# Patient Record
Sex: Female | Born: 1935
Health system: Southern US, Community
[De-identification: ages and names within clinical notes are randomized; demographics above are authoritative.]

## PROBLEM LIST (undated history)

## (undated) DIAGNOSIS — I10 Essential (primary) hypertension: Secondary | ICD-10-CM

## (undated) DIAGNOSIS — K219 Gastro-esophageal reflux disease without esophagitis: Secondary | ICD-10-CM

## (undated) DIAGNOSIS — K222 Esophageal obstruction: Secondary | ICD-10-CM

## (undated) DIAGNOSIS — G479 Sleep disorder, unspecified: Secondary | ICD-10-CM

## (undated) DIAGNOSIS — K224 Dyskinesia of esophagus: Secondary | ICD-10-CM

## (undated) DIAGNOSIS — R079 Chest pain, unspecified: Secondary | ICD-10-CM

## (undated) DIAGNOSIS — R002 Palpitations: Secondary | ICD-10-CM

## (undated) DIAGNOSIS — N289 Disorder of kidney and ureter, unspecified: Secondary | ICD-10-CM

## (undated) DIAGNOSIS — M8580 Other specified disorders of bone density and structure, unspecified site: Secondary | ICD-10-CM

## (undated) DIAGNOSIS — K589 Irritable bowel syndrome without diarrhea: Secondary | ICD-10-CM

## (undated) DIAGNOSIS — I2721 Secondary pulmonary arterial hypertension: Secondary | ICD-10-CM

## (undated) DIAGNOSIS — K449 Diaphragmatic hernia without obstruction or gangrene: Secondary | ICD-10-CM

## (undated) DIAGNOSIS — T7840XA Allergy, unspecified, initial encounter: Secondary | ICD-10-CM

## (undated) DIAGNOSIS — R55 Syncope and collapse: Secondary | ICD-10-CM

## (undated) DIAGNOSIS — M1990 Unspecified osteoarthritis, unspecified site: Secondary | ICD-10-CM

## (undated) DIAGNOSIS — N200 Calculus of kidney: Secondary | ICD-10-CM

## (undated) DIAGNOSIS — K5790 Diverticulosis of intestine, part unspecified, without perforation or abscess without bleeding: Secondary | ICD-10-CM

## (undated) HISTORY — DX: Other specified disorders of bone density and structure, unspecified site: M85.80

## (undated) HISTORY — DX: Syncope and collapse: R55

## (undated) HISTORY — DX: Gastro-esophageal reflux disease without esophagitis: K21.9

## (undated) HISTORY — DX: Palpitations: R00.2

## (undated) HISTORY — PX: CHOLECYSTECTOMY: SHX55

## (undated) HISTORY — DX: Calculus of kidney: N20.0

## (undated) HISTORY — PX: BREAST EXCISIONAL BIOPSY: SUR124

## (undated) HISTORY — DX: Chest pain, unspecified: R07.9

## (undated) HISTORY — DX: Allergy, unspecified, initial encounter: T78.40XA

## (undated) HISTORY — PX: ABDOMINAL HYSTERECTOMY: SHX81

## (undated) HISTORY — DX: Esophageal obstruction: K22.2

## (undated) HISTORY — DX: Diverticulosis of intestine, part unspecified, without perforation or abscess without bleeding: K57.90

## (undated) HISTORY — DX: Essential (primary) hypertension: I10

## (undated) HISTORY — DX: Dyskinesia of esophagus: K22.4

## (undated) HISTORY — DX: Diaphragmatic hernia without obstruction or gangrene: K44.9

## (undated) HISTORY — DX: Sleep disorder, unspecified: G47.9

## (undated) HISTORY — DX: Secondary pulmonary arterial hypertension: I27.21

## (undated) HISTORY — DX: Irritable bowel syndrome, unspecified: K58.9

## (undated) HISTORY — DX: Unspecified osteoarthritis, unspecified site: M19.90

---

## 1968-11-12 HISTORY — PX: KIDNEY SURGERY: SHX687

## 1971-11-13 HISTORY — PX: TOTAL ABDOMINAL HYSTERECTOMY W/ BILATERAL SALPINGOOPHORECTOMY: SHX83

## 1971-11-13 HISTORY — PX: APPENDECTOMY: SHX54

## 1991-02-11 HISTORY — PX: OTHER SURGICAL HISTORY: SHX169

## 1999-10-02 ENCOUNTER — Other Ambulatory Visit: Admission: RE | Admit: 1999-10-02 | Discharge: 1999-10-02 | Payer: Self-pay | Admitting: Obstetrics and Gynecology

## 2000-12-31 ENCOUNTER — Other Ambulatory Visit: Admission: RE | Admit: 2000-12-31 | Discharge: 2000-12-31 | Payer: Self-pay | Admitting: Obstetrics and Gynecology

## 2003-04-09 ENCOUNTER — Encounter: Payer: Self-pay | Admitting: Internal Medicine

## 2003-06-18 ENCOUNTER — Observation Stay (HOSPITAL_COMMUNITY): Admission: RE | Admit: 2003-06-18 | Discharge: 2003-06-19 | Payer: Self-pay | Admitting: General Surgery

## 2003-06-18 ENCOUNTER — Encounter (INDEPENDENT_AMBULATORY_CARE_PROVIDER_SITE_OTHER): Payer: Self-pay | Admitting: Specialist

## 2003-06-18 ENCOUNTER — Encounter: Payer: Self-pay | Admitting: General Surgery

## 2004-05-22 ENCOUNTER — Inpatient Hospital Stay (HOSPITAL_BASED_OUTPATIENT_CLINIC_OR_DEPARTMENT_OTHER): Admission: RE | Admit: 2004-05-22 | Discharge: 2004-05-22 | Payer: Self-pay | Admitting: Cardiology

## 2004-06-06 ENCOUNTER — Encounter: Payer: Self-pay | Admitting: Internal Medicine

## 2004-09-25 ENCOUNTER — Ambulatory Visit: Payer: Self-pay | Admitting: Internal Medicine

## 2004-12-28 ENCOUNTER — Ambulatory Visit: Payer: Self-pay | Admitting: Internal Medicine

## 2005-02-01 ENCOUNTER — Ambulatory Visit: Payer: Self-pay | Admitting: Internal Medicine

## 2005-03-12 ENCOUNTER — Ambulatory Visit: Payer: Self-pay | Admitting: Internal Medicine

## 2005-03-27 ENCOUNTER — Ambulatory Visit: Payer: Self-pay | Admitting: Internal Medicine

## 2005-05-18 ENCOUNTER — Ambulatory Visit: Payer: Self-pay | Admitting: Internal Medicine

## 2005-07-13 ENCOUNTER — Ambulatory Visit: Payer: Self-pay | Admitting: Internal Medicine

## 2005-08-29 ENCOUNTER — Ambulatory Visit: Payer: Self-pay | Admitting: Internal Medicine

## 2005-10-03 ENCOUNTER — Ambulatory Visit: Payer: Self-pay | Admitting: Internal Medicine

## 2005-12-17 ENCOUNTER — Ambulatory Visit: Payer: Self-pay | Admitting: Internal Medicine

## 2006-03-12 ENCOUNTER — Emergency Department (HOSPITAL_COMMUNITY): Admission: EM | Admit: 2006-03-12 | Discharge: 2006-03-12 | Payer: Self-pay | Admitting: Emergency Medicine

## 2006-03-14 ENCOUNTER — Ambulatory Visit: Payer: Self-pay | Admitting: Internal Medicine

## 2006-03-15 ENCOUNTER — Ambulatory Visit: Payer: Self-pay | Admitting: Internal Medicine

## 2006-05-08 ENCOUNTER — Ambulatory Visit: Payer: Self-pay | Admitting: Internal Medicine

## 2006-06-17 ENCOUNTER — Ambulatory Visit: Payer: Self-pay | Admitting: Family Medicine

## 2006-07-13 HISTORY — PX: ESOPHAGOGASTRODUODENOSCOPY: SHX1529

## 2006-07-30 ENCOUNTER — Ambulatory Visit: Payer: Self-pay | Admitting: Internal Medicine

## 2006-08-06 ENCOUNTER — Ambulatory Visit: Payer: Self-pay | Admitting: Internal Medicine

## 2006-08-06 ENCOUNTER — Encounter: Payer: Self-pay | Admitting: Internal Medicine

## 2006-08-27 ENCOUNTER — Ambulatory Visit: Payer: Self-pay | Admitting: Internal Medicine

## 2006-09-05 ENCOUNTER — Ambulatory Visit: Payer: Self-pay | Admitting: Internal Medicine

## 2006-09-10 ENCOUNTER — Ambulatory Visit (HOSPITAL_COMMUNITY): Admission: RE | Admit: 2006-09-10 | Discharge: 2006-09-10 | Payer: Self-pay | Admitting: Internal Medicine

## 2006-10-31 ENCOUNTER — Ambulatory Visit: Payer: Self-pay | Admitting: Internal Medicine

## 2007-01-14 ENCOUNTER — Ambulatory Visit: Payer: Self-pay | Admitting: Internal Medicine

## 2007-04-15 ENCOUNTER — Telehealth (INDEPENDENT_AMBULATORY_CARE_PROVIDER_SITE_OTHER): Payer: Self-pay | Admitting: *Deleted

## 2007-04-15 ENCOUNTER — Ambulatory Visit: Payer: Self-pay | Admitting: Internal Medicine

## 2007-04-15 DIAGNOSIS — F39 Unspecified mood [affective] disorder: Secondary | ICD-10-CM | POA: Insufficient documentation

## 2007-04-15 DIAGNOSIS — M899 Disorder of bone, unspecified: Secondary | ICD-10-CM | POA: Insufficient documentation

## 2007-04-15 DIAGNOSIS — M949 Disorder of cartilage, unspecified: Secondary | ICD-10-CM

## 2007-04-15 DIAGNOSIS — K219 Gastro-esophageal reflux disease without esophagitis: Secondary | ICD-10-CM | POA: Insufficient documentation

## 2007-04-15 DIAGNOSIS — I10 Essential (primary) hypertension: Secondary | ICD-10-CM | POA: Insufficient documentation

## 2007-04-15 DIAGNOSIS — K5730 Diverticulosis of large intestine without perforation or abscess without bleeding: Secondary | ICD-10-CM | POA: Insufficient documentation

## 2007-04-15 DIAGNOSIS — J309 Allergic rhinitis, unspecified: Secondary | ICD-10-CM | POA: Insufficient documentation

## 2007-07-24 ENCOUNTER — Ambulatory Visit: Payer: Self-pay | Admitting: Internal Medicine

## 2007-08-29 ENCOUNTER — Ambulatory Visit: Payer: Self-pay | Admitting: Internal Medicine

## 2007-11-24 ENCOUNTER — Ambulatory Visit: Payer: Self-pay | Admitting: Internal Medicine

## 2007-11-25 ENCOUNTER — Encounter: Payer: Self-pay | Admitting: Internal Medicine

## 2007-11-27 ENCOUNTER — Ambulatory Visit: Payer: Self-pay | Admitting: Internal Medicine

## 2007-12-31 ENCOUNTER — Telehealth: Payer: Self-pay | Admitting: Internal Medicine

## 2008-04-23 ENCOUNTER — Ambulatory Visit: Payer: Self-pay | Admitting: Family Medicine

## 2008-05-25 ENCOUNTER — Telehealth: Payer: Self-pay | Admitting: Internal Medicine

## 2008-06-03 ENCOUNTER — Telehealth: Payer: Self-pay | Admitting: Internal Medicine

## 2008-06-08 ENCOUNTER — Ambulatory Visit: Payer: Self-pay | Admitting: Internal Medicine

## 2008-06-08 DIAGNOSIS — K589 Irritable bowel syndrome without diarrhea: Secondary | ICD-10-CM

## 2008-08-16 ENCOUNTER — Ambulatory Visit: Payer: Self-pay | Admitting: Internal Medicine

## 2008-09-23 ENCOUNTER — Telehealth: Payer: Self-pay | Admitting: Internal Medicine

## 2008-11-06 ENCOUNTER — Telehealth: Payer: Self-pay | Admitting: Family Medicine

## 2008-12-09 ENCOUNTER — Ambulatory Visit: Payer: Self-pay | Admitting: Family Medicine

## 2008-12-13 ENCOUNTER — Telehealth: Payer: Self-pay | Admitting: Internal Medicine

## 2008-12-17 ENCOUNTER — Telehealth: Payer: Self-pay | Admitting: Internal Medicine

## 2009-01-13 ENCOUNTER — Ambulatory Visit: Payer: Self-pay | Admitting: Family Medicine

## 2009-01-13 LAB — CONVERTED CEMR LAB
Eosinophils Absolute: 0.1 10*3/uL (ref 0.0–0.7)
Eosinophils Relative: 0.8 % (ref 0.0–5.0)
HCT: 37.1 % (ref 36.0–46.0)
Hemoglobin: 12.9 g/dL (ref 12.0–15.0)
MCV: 83.4 fL (ref 78.0–100.0)
Monocytes Absolute: 0.6 10*3/uL (ref 0.1–1.0)
Monocytes Relative: 7.4 % (ref 3.0–12.0)
Neutro Abs: 6.7 10*3/uL (ref 1.4–7.7)
Platelets: 192 10*3/uL (ref 150–400)
WBC: 8.1 10*3/uL (ref 4.5–10.5)

## 2009-01-15 ENCOUNTER — Encounter: Payer: Self-pay | Admitting: Family Medicine

## 2009-04-07 ENCOUNTER — Ambulatory Visit: Payer: Self-pay | Admitting: Family Medicine

## 2009-08-03 ENCOUNTER — Ambulatory Visit: Payer: Self-pay | Admitting: Internal Medicine

## 2009-08-05 LAB — CONVERTED CEMR LAB
AST: 19 units/L (ref 0–37)
Albumin: 3.9 g/dL (ref 3.5–5.2)
Alkaline Phosphatase: 71 units/L (ref 39–117)
BUN: 20 mg/dL (ref 6–23)
Basophils Absolute: 0 10*3/uL (ref 0.0–0.1)
Bilirubin, Direct: 0.1 mg/dL (ref 0.0–0.3)
Calcium: 9.5 mg/dL (ref 8.4–10.5)
Creatinine, Ser: 0.8 mg/dL (ref 0.4–1.2)
Eosinophils Absolute: 0.3 10*3/uL (ref 0.0–0.7)
Glucose, Bld: 86 mg/dL (ref 70–99)
Lymphocytes Relative: 18.3 % (ref 12.0–46.0)
MCHC: 33.4 g/dL (ref 30.0–36.0)
Monocytes Relative: 6.6 % (ref 3.0–12.0)
Neutro Abs: 4 10*3/uL (ref 1.4–7.7)
Neutrophils Relative %: 70.2 % (ref 43.0–77.0)
Phosphorus: 3.6 mg/dL (ref 2.3–4.6)
Platelets: 196 10*3/uL (ref 150.0–400.0)
Potassium: 3.9 meq/L (ref 3.5–5.1)
RDW: 12 % (ref 11.5–14.6)
TSH: 0.84 microintl units/mL (ref 0.35–5.50)
Total Bilirubin: 0.9 mg/dL (ref 0.3–1.2)

## 2009-08-12 ENCOUNTER — Telehealth: Payer: Self-pay | Admitting: Internal Medicine

## 2009-09-21 ENCOUNTER — Telehealth: Payer: Self-pay | Admitting: Internal Medicine

## 2009-10-21 ENCOUNTER — Ambulatory Visit: Payer: Self-pay | Admitting: Internal Medicine

## 2010-04-05 ENCOUNTER — Telehealth: Payer: Self-pay | Admitting: Internal Medicine

## 2010-07-11 ENCOUNTER — Ambulatory Visit: Payer: Self-pay | Admitting: Internal Medicine

## 2010-07-12 ENCOUNTER — Telehealth: Payer: Self-pay | Admitting: Internal Medicine

## 2010-08-08 ENCOUNTER — Ambulatory Visit: Payer: Self-pay | Admitting: Internal Medicine

## 2010-08-08 ENCOUNTER — Encounter: Payer: Self-pay | Admitting: Internal Medicine

## 2010-08-08 DIAGNOSIS — R002 Palpitations: Secondary | ICD-10-CM | POA: Insufficient documentation

## 2010-08-09 LAB — CONVERTED CEMR LAB
ALT: 17 units/L (ref 0–35)
AST: 20 units/L (ref 0–37)
BUN: 23 mg/dL (ref 6–23)
CO2: 32 meq/L (ref 19–32)
Calcium: 9.4 mg/dL (ref 8.4–10.5)
Chloride: 104 meq/L (ref 96–112)
Eosinophils Relative: 1.5 % (ref 0.0–5.0)
GFR calc non Af Amer: 76.86 mL/min (ref >60)
HCT: 38.4 % (ref 36.0–46.0)
Hemoglobin: 13.4 g/dL (ref 12.0–15.0)
Lymphs Abs: 0.9 10*3/uL (ref 0.7–4.0)
MCV: 84 fL (ref 78.0–100.0)
Monocytes Absolute: 0.3 10*3/uL (ref 0.1–1.0)
Monocytes Relative: 7.1 % (ref 3.0–12.0)
Neutro Abs: 3.4 10*3/uL (ref 1.4–7.7)
RDW: 13.1 % (ref 11.5–14.6)
TSH: 1.02 microintl units/mL (ref 0.35–5.50)

## 2010-08-15 ENCOUNTER — Encounter: Admission: RE | Admit: 2010-08-15 | Discharge: 2010-08-15 | Payer: Self-pay | Admitting: Internal Medicine

## 2010-08-15 LAB — HM MAMMOGRAPHY: HM Mammogram: NORMAL

## 2010-08-16 ENCOUNTER — Encounter: Payer: Self-pay | Admitting: Internal Medicine

## 2010-11-12 HISTORY — PX: SQUAMOUS CELL CARCINOMA EXCISION: SHX2433

## 2010-12-12 NOTE — Letter (Signed)
Summary: Results Follow up Letter  [place] [facility]  [address] [place], [place] [address]   Phone: [phone]  Fax: [phone]    08/16/2010 MRN: [id number]  Dominique Hall [address] [place], [place]  [address]  Dear Dominique Hall,  The following are the results of your recent test(s):  Test         Result    Pap Smear:        Normal _____  Not Normal _____ Comments: ______________________________________________________ Cholesterol: LDL(Bad cholesterol):         Your goal is less than:         HDL (Good cholesterol):       Your goal is more than: Comments:  ______________________________________________________ Mammogram:        Normal __X___  Not Normal _____ Comments:mammo is fine repeat recommended in 1-2 years  ___________________________________________________________________ Hemoccult:        Normal _____  Not normal _______ Comments:    _____________________________________________________________________ Other Tests:    We routinely do not discuss normal results over the telephone.  If you desire a copy of the results, or you have any questions about this information we can discuss them at your next office visit.   Sincerely,

## 2010-12-12 NOTE — Progress Notes (Signed)
Summary: not feeling any better  Phone Note Call from Patient Call back at [phone]   Caller: Patient Call For: Dominique Salt MD Summary of Call: Patient is requesting an antibiotic to be called in to [facility]. She was seen yesterday w/ sore throat and she says that she is not feeling any better today. She would like to go ahead and start w/ antibiotic becuase she doesn't want it to get any worse. Please advise.  Initial call taken by: Melody Comas,  July 12, 2010 9:38 AM  Follow-up for Phone Call        let her know I sent Rx Follow-up by: Dominique Salt MD,  July 12, 2010 10:25 AM  Additional Follow-up for Phone Call Additional follow up Details #1::        Spoke with patient and advised results. Rx sent to pharmacy. Additional Follow-up by: Mervin Hack CMA (AAMA),  July 12, 2010 10:30 AM    New/Updated Medications: AZITHROMYCIN 250 MG TABS (AZITHROMYCIN) 2 tabs today, then 1 tab daily for 4 days for sinus infection Prescriptions: AZITHROMYCIN 250 MG TABS (AZITHROMYCIN) 2 tabs today, then 1 tab daily for 4 days for sinus infection  #6 x 0   Entered and Authorized by:   Dominique Salt MD   Signed by:   Dominique Salt MD on 07/12/2010   Method used:   Electronically to        [facility] [address] (retail)       [address]       [place], [place]  [address]       Ph: [phone]       Fax: [phone]   RxID:   [id number]

## 2010-12-12 NOTE — Progress Notes (Signed)
Summary: refill request for hctz  Phone Note Refill Request Message from:  Fax from Pharmacy  Refills Requested: Medication #1:  HYDROCHLOROTHIAZIDE 12.5 MG CAPS 1 daily Faxed form from [facility] is on your desk.  Initial call taken by: Lowella Petties CMA,  Apr 05, 2010 10:39 AM  Follow-up for Phone Call        okay x 1 year Follow-up by: Cindee Salt MD,  Apr 05, 2010 1:48 PM  Additional Follow-up for Phone Call Additional follow up Details #1::        Rx faxed to pharmacy Additional Follow-up by: DeShannon Smith CMA Duncan Dull),  Apr 05, 2010 4:22 PM    Prescriptions: HYDROCHLOROTHIAZIDE 12.5 MG CAPS (HYDROCHLOROTHIAZIDE) 1 daily  #90 x 3   Entered by:   Mervin Hack CMA (AAMA)   Authorized by:   Cindee Salt MD   Signed by:   Mervin Hack CMA (AAMA) on 04/05/2010   Method used:   Handwritten   RxID:   [id number]

## 2010-12-12 NOTE — Assessment & Plan Note (Signed)
Summary: SORE THROAT AND LARYNGITIS/JRR   Vital Signs:  Patient profile:   76 year old female Weight:      155 pounds BMI:     25.49 Temp:     98.3 degrees F oral Pulse rate:   80 / minute Pulse rhythm:   regular Resp:     16 per minute BP sitting:   120 / 70  (left arm) Cuff size:   regular  Vitals Entered By: Mervin Hack CMA Duncan Dull) (July 11, 2010 12:49 PM) CC: sore throat, sinus draining   History of Present Illness: Started with infection symptoms about 4-5 days ago Got chilled in various places with air conditioning the day before started with sore throat and laryngitis voice is some better now  Not muchy cough except one night 3 days ago--had croupy no fever no SOB Has had post nasal drip no otalgia  No meds for this  Allergies: 1)  ! * Shellfish 2)  Penicillin G Potassium (Penicillin G Potassium) 3)  * Sulfa (Sulfonamides) Group 4)  Aciphex (Rabeprazole Sodium)  Past History:  Past medical, surgical, family and social histories (including risk factors) reviewed for relevance to current acute and chronic problems.  Past Medical History: Reviewed history from 11/24/2007 and no changes required. Allergic rhinitis Anxiety/sleep disturbance Diverticulosis, colon GERD/hiatal hernia Hypertension Osteopenia IBS Esophageal Dysmotility (suspected by hx) Hiatal Hernia Non-obstructing Esophageal Ring  Past Surgical History: Reviewed history from 04/15/2007 and no changes required. Hysterectomy/BSO/appendectomy--1973 Tonsillectomy 4/92  Lumpectomy R breast (benign) 1970's Kidney operation 1997  Syncope 9/07   EGD/dilation--Gessner 8/04 Lap chole  Family History: Reviewed history from 04/15/2007 and no changes required. Dad died @89  MI, pneumonia Mom died @86  CVA No DM or cancer  Social History: Reviewed history from 06/08/2008 and no changes required. 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 Reino Kent AM  Review of Systems       had sense that she might have had fluid in right ear one day no nausea or vomiting able to eat okay  Physical Exam  General:  alert.  NAD Head:  No frontal or maxillary tenderness Ears:  R ear normal and L ear normal.   Nose:  mild inflammation Mouth:  no erythema and no exudates.   Neck:  supple, no masses, and no cervical lymphadenopathy.   Lungs:  normal respiratory effort, no intercostal retractions, no accessory muscle use, normal breath sounds, no crackles, and no wheezes.     Impression & Recommendations:  Problem # 1:  URI (ICD-465.9) Assessment New mostly laryngitis discussed apparent viral etiology discussed supportive care will have her call and will consdier antibiotics if signs of secondary bacterial infection  Complete Medication List: 1)  Hydrochlorothiazide 12.5 Mg Caps (Hydrochlorothiazide) .Marland Kitchen.. 1 daily 2)  Nexium 40 Mg Cpdr (Esomeprazole magnesium) .Marland Kitchen.. 1 daily each am 3)  Clorazepate Dipotassium 7.5 Mg Tabs (Clorazepate dipotassium) .Marland Kitchen.. 1 tab at bedtime as needed to help sleep  Patient Instructions: 1)  Please keep the appt for physical in September but cancel the blood test appt---we will draw the labs at the physical appt Prescriptions: CLORAZEPATE DIPOTASSIUM 7.5 MG TABS (CLORAZEPATE DIPOTASSIUM) 1 tab at bedtime as needed to help sleep  #30 x 0   Entered and Authorized by:   Cindee Salt MD   Signed by:   Cindee Salt MD on 07/11/2010   Method used:   Print then  Give to Patient   RxID:   [id number]   Current Allergies (reviewed today): ! * SHELLFISH PENICILLIN G POTASSIUM (PENICILLIN G POTASSIUM) * SULFA (SULFONAMIDES) GROUP ACIPHEX (RABEPRAZOLE SODIUM)

## 2010-12-12 NOTE — Assessment & Plan Note (Signed)
Summary: CPX/CLE   Vital Signs:  Patient profile:   76 year old female Weight:      152 pounds Temp:     98.1 degrees F oral Pulse rate:   72 / minute Pulse rhythm:   regular BP sitting:   120 / 70  (left arm) Cuff size:   regular  Vitals Entered By: Mervin Hack CMA Duncan Dull) (August 08, 2010 9:21 AM) CC: adult physical   History of Present Illness: Doing fine No new concerns due for mammo  Due for mammo  Tries to be careful with diet--inconsistent with this Careful with portions Starting to walk again  Doesn't remember last DEXA Not taking vitamin D--discussed this     Allergies: 1)  ! * Shellfish 2)  Penicillin G Potassium (Penicillin G Potassium) 3)  * Sulfa (Sulfonamides) Group 4)  Aciphex (Rabeprazole Sodium)  Past History:  Past medical, surgical, family and social histories (including risk factors) reviewed for relevance to current acute and chronic problems.  Past Medical History: Reviewed history from 11/24/2007 and no changes required. Allergic rhinitis Anxiety/sleep disturbance Diverticulosis, colon GERD/hiatal hernia Hypertension Osteopenia IBS Esophageal Dysmotility (suspected by hx) Hiatal Hernia Non-obstructing Esophageal Ring  Past Surgical History: Reviewed history from 04/15/2007 and no changes required. Hysterectomy/BSO/appendectomy--1973 Tonsillectomy 4/92  Lumpectomy R breast (benign) 1970's Kidney operation 1997  Syncope 9/07   EGD/dilation--Gessner 8/04 Lap chole  Family History: Reviewed history from 04/15/2007 and no changes required. Dad died @89  MI, pneumonia Mom died @86  CVA No DM or cancer  Social History: Reviewed history from 06/08/2008 and no changes required. 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 Reino Kent AM  Review of Systems General:  weight down a few pounds sleeps fairly  well--- occ bad night. No daytime sonmolence wears seat belt. Eyes:  Denies double vision and vision loss-1 eye; eye exam yesterday---early cataracts. ENT:  Complains of decreased hearing; denies ringing in ears; teeth okay--regular with dentist. CV:  Complains of palpitations; denies chest pain or discomfort, difficulty breathing at night, difficulty breathing while lying down, fainting, near fainting, and shortness of breath with exertion; occ palps "when excited" Gets tight across upper chest then Has restarted walking and notes progressive increasing stamina. Resp:  Denies cough and shortness of breath. GI:  Complains of indigestion; denies abdominal pain, bloody stools, change in bowel habits, dark tarry stools, nausea, and vomiting; reflux fairly well controlled with meds. GU:  Denies abnormal vaginal bleeding, dysuria, and incontinence; no sex--no problem . MS:  Denies joint pain and joint swelling; foot pain from plantar fasciitis is better. Derm:  Complains of itching and lesion(s); denies rash; occ red sore spots on her fingers that go away within a few days Has itchy spot on left foot--no lesion or rash. Neuro:  Denies headaches, numbness, tingling, and weakness. Psych:  Denies anxiety and depression. Endo:  Complains of cold intolerance; denies heat intolerance; mild increase in cold intolerance. Heme:  Denies abnormal bruising and enlarge lymph nodes. Allergy:  Denies seasonal allergies and sneezing.  Physical Exam  General:  alert and normal appearance.   Eyes:  pupils equal, pupils round, pupils reactive to light, and no optic disk abnormalities.   Ears:  R ear normal and L ear normal.   Mouth:  no erythema, no exudates, and no lesions.   Neck:  supple, no masses, no thyromegaly, no carotid bruits, and no cervical lymphadenopathy.   Breasts:  no masses, no abnormal thickening, no tenderness, and no adenopathy.   Lungs:  normal respiratory effort, no intercostal retractions,  no accessory muscle use, and normal breath sounds.   Heart:  normal rate, regular rhythm, no murmur, and no gallop.   Abdomen:  soft, non-tender, and no masses.   Msk:  no joint tenderness and no joint swelling.   Pulses:  1+ in feet Extremities:  No edema Neurologic:  alert & oriented X3, strength normal in all extremities, and gait normal.   Skin:  no rashes and no suspicious lesions.   Psych:  normally interactive, good eye contact, not anxious appearing, and not depressed appearing.     Impression & Recommendations:  Problem # 1:  PREVENTIVE HEALTH CARE (ICD-V70.0) Assessment Comment Only better on fitness due for mammo  Problem # 2:  PALPITATIONS (ICD-785.1) Assessment: New doesn't sound pathologic improved exercise tolerance of late EKG normal  Problem # 3:  HYPERTENSION (ICD-401.9) Assessment: Unchanged  good control no changes check labs  Her updated medication list for this problem includes:    Hydrochlorothiazide 12.5 Mg Caps (Hydrochlorothiazide) .Marland Kitchen... 1 daily  BP today: 120/70 Prior BP: 120/70 (07/11/2010)  Labs Reviewed: K+: 3.9 (08/03/2009) Creat: : 0.8 (08/03/2009)     Orders: Venipuncture ([address]) TLB-Renal Function Panel (80069-RENAL) TLB-CBC Platelet - w/Differential (85025-CBCD) TLB-Hepatic/Liver Function Pnl (80076-HEPATIC) TLB-TSH (Thyroid Stimulating Hormone) (84443-TSH)  Problem # 4:  GERD (ICD-530.81) Assessment: Unchanged okay on med  Her updated medication list for this problem includes:    Nexium 40 Mg Cpdr (Esomeprazole magnesium) .Marland Kitchen... 1 daily each am  Problem # 5:  OSTEOPENIA (ICD-733.90) Assessment: Comment Only  will recheck DEXA  Her updated medication list for this problem includes:    Vitamin D 1000 Unit Tabs (Cholecalciferol) .Marland Kitchen... 1 tab daily to strengthen bones  Orders: Radiology Referral (Radiology)  Problem # 6:  ANXIETY (ICD-300.00) Assessment: Unchanged uses the med occ  Her updated medication list for this  problem includes:    Clorazepate Dipotassium 7.5 Mg Tabs (Clorazepate dipotassium) .Marland Kitchen... 1 tab at bedtime as needed to help sleep  Complete Medication List: 1)  Hydrochlorothiazide 12.5 Mg Caps (Hydrochlorothiazide) .Marland Kitchen.. 1 daily 2)  Nexium 40 Mg Cpdr (Esomeprazole magnesium) .Marland Kitchen.. 1 daily each am 3)  Clorazepate Dipotassium 7.5 Mg Tabs (Clorazepate dipotassium) .Marland Kitchen.. 1 tab at bedtime as needed to help sleep 4)  Vitamin D 1000 Unit Tabs (Cholecalciferol) .Marland Kitchen.. 1 tab daily to strengthen bones  Other Orders: Influenza Vaccine MCR (60454)  Patient Instructions: 1)  Please schedule a follow-up appointment in 1 year.  2)  Schedule your mammogram.  3)  Please set up bone density test  Current Allergies (reviewed today): ! * SHELLFISH PENICILLIN G POTASSIUM (PENICILLIN G POTASSIUM) * SULFA (SULFONAMIDES) GROUP ACIPHEX (RABEPRAZOLE SODIUM)   Influenza Vaccine    Vaccine Type: Fluvax MCR    Site: left deltoid    Mfr: GlaxoSmithKline    Dose: 0.5 ml    Route: IM    Given by: Mervin Hack CMA (AAMA)    Exp. Date: 05/12/2011    Lot #: [id number]    VIS given: 06/06/10 version given August 08, 2010.  Flu Vaccine Consent Questions    Do you have a history of severe allergic reactions to this vaccine? no    Any prior history of allergic reactions to egg and/or gelatin? no    Do you have a sensitivity to the preservative Thimersol? no    Do you have a past history of Guillan-Barre Syndrome?  no    Do you currently have an acute febrile illness? no    Have you ever had a severe reaction to latex? no    Vaccine information given and explained to patient? yes    Are you currently pregnant? no

## 2011-03-30 NOTE — Op Note (Signed)
NAME:  BEAUTY, PLESS NO.:  [id number]   MEDICAL RECORD NO.:  [id number]                   PATIENT TYPE:  AMB   LOCATION:  DAY                                  FACILITY:  [facility]   PHYSICIAN:  Adolph Pollack, M.D.            DATE OF BIRTH:  1935/09/12   DATE OF PROCEDURE:  06/18/2003  DATE OF DISCHARGE:                                 OPERATIVE REPORT   PREOPERATIVE DIAGNOSIS:  Symptomatic cholecystitis.   POSTOPERATIVE DIAGNOSIS:  Chronic cholecystitis, cholelithiasis.   OPERATION/PROCEDURE:  Laparoscopic cholecystectomy with intraoperative  cholangiogram.   SURGEON:  Adolph Pollack, M.D.   ASSISTANT:  Abigail Miyamoto, M.D.   ANESTHESIA:  General.   INDICATIONS:  Mrs. Russi is a 76 year old female.  She had known gallbladder  disease. She has had some postprandial right upper quadrant pain radiating  through to the back.  This is consistent with biliary colic.  She has a  known 30 mm gallstone.  Now presents for elective cholecystectomy.  The  procedure and the risks were discussed with her preoperatively.   DESCRIPTION OF PROCEDURE:  She is seen in the holding area, brought to the  operating room, placed supine on the operating table and general anesthetic  was administered.  Her abdomen was sterilely prepped and draped.  Dilute  Marcaine solution was infiltrated in the subcutaneous tissue in the  subumbilical area and a small subumbilical incision was made incising the  skin and subcutaneous tissue sharply until the midline fascia was  identified.  A small incision was made in the midline fascia and the  peritoneum under direct vision.  The peritoneal cavity was entered.  A  pursestring suture was of 0 Vicryl was placed along the fascial edges.  A  Hasson trocar was introduced into the peritoneal cavity and pneumoperitoneum  was created by insufflation of the CO2 gas.  Next, a laparoscope was  introduced and I inspected the area  directly under the trocar.  No bowel  injury or other injury was noted.  She was then placed in reverse  Trendelenburg position and partial right lateral decubitus.  Under direct  vision, an 11 mm trocar was through an epigastric incision and two 5 mm  trocars were placed in the right mid and lateral abdomen.  The fundus of the  gallbladder was grasped and the gallbladder was noted to be a pale red,  grossly consistent with chronic inflammatory-type changes.  The fundus was  retracted toward the right shoulder.  Then the infundibulum was grasped and  mobilized using select cautery and blunt dissection.  I then isolated the  cystic duct-gallbladder junction.  Using blunt dissection, I created a  window around the cystic duct.  I placed a cup clip above the cystic duct-  gallbladder junction, then made an incision in the cystic duct at its  junction with the gallbladder.  The bile  was milked back from this.  I  placed a cholangiocatheter through the anterior abdominal wall through the  abdominal wall into the cystic duct and a cholangiogram was performed.   Using real time fluoroscopy, dilute contrast material was injected into the  cystic duct.  It was a moderate to long cystic duct.  The common hepatic,  right and left hepatic and common bile duct all filled promptly.  The common  bile duct promptly drained the contrast into the duodenum.  No obvious  evidence of obstruction was noted.  Final report is pending the  radiologist's interpretation.   The cholangiocatheter was removed, the cystic duct was clipped three times  staying inside and divided.  I identified a cystic artery, clipped it and  divided it.  The gallbladder was then dissected free from the liver bed  using cautery.  A small puncture wound was made in the gallbladder and a  small amount of bile leaked out.  Once the gallbladder was removed from the  liver bed, it was placed in an Endopouch bag.   Next, I irrigated the  gallbladder fossa and inspected it.  Bleeding points  were controlled with the cautery.  I inspected it a number of other times  and noted no further bleeding.  I noted no bile leak.  I copiously irrigated  out the perihepatic area and evacuated the fluid until it was clear.  Next,  under laparoscopic vision, I removed the gallbladder in the Endopouch bag  through the subumbilical port and closed the subumbilicus fascia defect  under laparoscopic vision by tightening up and tightening down the  pursestring.  The remaining fluid was then evacuated.  The trocars were  removed and the pneumoperitoneum was released.   The skin incisions were closed with 4-0 Monocryl subcuticular stitches.  Steri-Strips and sterile dressings were applied.  She tolerated the  procedure well without any apparent complications and was taken to the  recovery room in satisfactory condition.                                                 Adolph Pollack, M.D.    Kari Baars  D:  06/18/2003  T:  06/18/2003  Job:  [id number]   cc:   Iva Boop, M.D. [facility]   Tillman Abide, M.D.  [facility]  [address]  [place] [address]

## 2011-03-30 NOTE — Cardiovascular Report (Signed)
NAME:  Dominique Hall, Dominique Hall NO.:  [id number]   MEDICAL RECORD NO.:  [id number]                   PATIENT TYPE:  OIB   LOCATION:  6501                                 FACILITY:  [facility]   PHYSICIAN:  Rollene Rotunda, M.D.                DATE OF BIRTH:  1934/12/17   DATE OF PROCEDURE:  05/22/2004  DATE OF DISCHARGE:                              CARDIAC CATHETERIZATION   PRIMARY CARE PHYSICIAN:  Dr. Tillman Abide.   CARDIOLOGIST:  Pricilla Riffle, M.D.   PROCEDURE:  Evaluate patient with chest pain suggestive of new onset  exertional angina.   PROCEDURE NOTE:  Left heart catheterization was performed via the right  femoral artery.  The artery was cannulated using an anterior wall puncture.  A 4 French arterial sheath was inserted via the modified Seldinger  technique.  Preformed Judkins and a pigtail catheter were utilized.  The  patient tolerated the procedure well and left the lab in stable condition.   RESULTS:   HEMODYNAMICS:  1. LV 201/15.  2. Aortic 201/89.   CORONARIES:  The left main was normal.  The LAD was normal with mild mid  muscle bridging.  There were three or four small diagonals.  The circumflex  was a small narrow caliber vessel in the AV groove.  There was a large first  obtuse marginal which was branching and normal.  The right coronary artery  was dominant though not a wide caliber vessel.  There was a small PDA which  was normal.  The ejection fraction was 65%.   CONCLUSION:  1. No obstructive coronary artery disease.  2. Normal left ventricular function.   PLAN:  No further cardiac workup is suggested.  The patient will continue to  follow with Dr. Dr. Alphonsus Sias for evaluation of nonanginal chest pain.                                               Rollene Rotunda, M.D.    JH/MEDQ  D:  05/22/2004  T:  05/22/2004  Job:  [id number]

## 2011-03-30 NOTE — Assessment & Plan Note (Signed)
[facility]                           GASTROENTEROLOGY OFFICE NOTE   NAME:JONESHayden, Mabin                    MRN:          [id number]  DATE:09/05/2006                            DOB:          17-Apr-1935    Ms. Kraft returns to follow up chest pain and dysphagia.   Her dysphagia is improved after I dilated her esophagus.  She had some  fundic gland polyps as well on August 06, 2006.  However, she is still  having spells several times a week for the whole day where she has what  feels like a spasm and a tightening in the chest and maybe food does not go  down well.  It usually starts after eating.  There was one episode of a few  minutes of burning in the suprasternal notch after she walked hard and fast,  leaving a football game.  She describes no dyspnea or sweats or other  problems like that.  She has had cardiac workups over the years.  These  pains actually are similar to symptoms that I see documented in the chart  for years.  She tells me she actually has had a catheterization in the past  as well and that has been negative.  See my last visit for other details.   She does take Tranxene with some benefit, taking a whole rather than a half  a pill at this time.  She still seems frustrated by these symptoms.  Past  medical history reviewed and unchanged from note of July 30, 2006.   PHYSICAL EXAMINATION:  Weight 154 pounds, height 5 feet 5 inches, pulse 60,  blood pressure 120/70.   ASSESSMENT:  Dysphagia and chest pain, probably some sort of motility  disturbance.  She does have a 2 cm hiatal hernia.  She had a bit of a wing  in the esophagus with a lumen diameter of about 17 mm but I do not think  that was causing all of her symptoms obviously.  Her dysphagia is improved.   PLAN:  1. Check barium swallow.  2. Continue Tranxene.  3. Depending on results of barium swallow, either get a manometry study of      the esophagus or try  tricyclics or perhaps calcium channel blockers,      assuming this is a motility disturbance.  Daily Tranxene may be another      thing to do also, which could be prophylactic, I think.  She is using      it intermittently right now.     Iva Boop, MD,FACG    CEG/MedQ  DD: 09/05/2006  DT: 09/06/2006  Job #: [id number]   cc:   Karie Schwalbe, MD

## 2011-03-30 NOTE — Assessment & Plan Note (Signed)
[facility]                           GASTROENTEROLOGY OFFICE NOTE   NAME:JONESFinley, Dinkel                    MRN:          [id number]  DATE:07/30/2006                            DOB:          10-21-1935    CHIEF COMPLAINT:  Chest pain, esophageal spasms.   ASSESSMENT:  Dysphagia and chest pain problems that sound most like an  esophageal dysmotility problem, though a ring or stricture could cause some  of this.   PLAN:  1. Schedule upper GI endoscopy with possible esophageal dilation.  2. Further medication treatment with nitrates versus calcium channel      blockers versus tricyclic agent pending review of this, clinical course      and possible barium studies and manometry.  Risks, benefits and      indications of all the above explained.   HISTORY:  This is a 76 year old white woman that I know from previous  evaluation, she has had a colonoscopy for screening and symptoms of  irritable bowel syndrome had been an issue.  She also had an EGD with a  patent ring and a small hiatal hernia in Mar 20, 2003, though she did not  have complaints of dysphagia at that time, she had upper abdominal  discomfort and reflux.  For the past several months or so, she has had  spells where she feels like things tighten up in the esophagus and food does  not go down quite well, and she has spasm and a squeezing sensation.  She  has been to the emergency department for a cardiac evaluation, and  apparently had a stress test afterward, and that has been negative.  She saw  Dr. Alphonsus Sias in June, and complained about this, and told him she did better  on Nexium, and was switched back to that from omeprazole and had also  previously been on Protonix.  There is no real impact dysphagia, but food  tends to move down slowly.  Cold liquids can do this.  She tends to drink  cold Dr. Reino Kent in the morning.  She does not describe a lot of heartburn at  this time.   REVIEW OF SYSTEMS:  Negative for constitutional problems, respiratory  problems, psychiatric or anxiety problems.  She has had some palpitations  occasionally, and at least one time associated with this, and that is when  she had the emergency department evaluation and cardiac workup.   PAST MEDICAL HISTORY:  1. As above.  2. Cholecystectomy.  3. Diverticulosis and hemorrhoids on colonoscopy.  4. Irritable bowel syndrome.  5. Osteopenia.  6. Syncope 1997.  7. Kidney surgery in 1970s.  8. Tonsillectomy.  9. Right benign breast lumpectomy.  10.Hysterectomy with right salpingo-oophorectomy.  11.Appendectomy.  12.History of anxiety, though she says not active.  13.Allergies.   MEDICATIONS:  1. Nexium 40 mg twice daily.  2. Premarin 0.625 mg daily.  3. Aspirin 81 mg daily.  4. Hydrochlorothiazide 12.5 mg daily.  5. Tranxene 7.5 a half tablet p.r.n. (this does help some of her      symptoms).  6. Ibuprofen as needed.  7. Librax  p.r.n., did not really help.   DRUG ALLERGIES:  SULFA, PENICILLIN, SHELLFISH ALLERGY are noted.   FAMILY AND SOCIAL HISTORY:  Reviewed and unchanged.   PHYSICAL EXAMINATION:  GENERAL:  Reveals a well-developed, well-nourished  white woman.  VITAL SIGNS:  Weight 155 pounds, pulse 60, blood pressure 114/66.  EYES:  Anicteric.  ENT:  Mouth, posterior pharynx free of lesions.  NECK:  Supple.  CHEST:  Clear.  HEART:  S1, S2, no murmurs or gallops.  ABDOMEN:  Soft without organomegaly or mass.  LYMPH NODES:  No neck or supraclavicular adenopathy detected.  NEUROLOGIC:  She is alert and oriented x3.   I appreciate the opportunity to care for this patient.  I have reviewed Dr.  Karle Starch recent office notes.                                   Iva Boop, MD,FACG   CEG/MedQ  DD:  07/31/2006  DT:  08/02/2006  Job #:  [id number]   cc:   Karie Schwalbe, MD

## 2011-04-06 ENCOUNTER — Encounter: Payer: Self-pay | Admitting: Nurse Practitioner

## 2011-04-13 ENCOUNTER — Encounter: Payer: Self-pay | Admitting: Nurse Practitioner

## 2011-04-13 HISTORY — PX: SQUAMOUS CELL CARCINOMA EXCISION: SHX2433

## 2011-04-20 ENCOUNTER — Ambulatory Visit: Payer: Self-pay | Admitting: Surgery

## 2011-04-20 DIAGNOSIS — I10 Essential (primary) hypertension: Secondary | ICD-10-CM

## 2011-04-27 ENCOUNTER — Ambulatory Visit: Payer: Self-pay | Admitting: Surgery

## 2011-05-01 ENCOUNTER — Encounter: Payer: Self-pay | Admitting: Internal Medicine

## 2011-05-01 LAB — PATHOLOGY REPORT

## 2011-05-13 ENCOUNTER — Encounter: Payer: Self-pay | Admitting: Nurse Practitioner

## 2011-06-20 ENCOUNTER — Ambulatory Visit (INDEPENDENT_AMBULATORY_CARE_PROVIDER_SITE_OTHER): Payer: Medicare Other | Admitting: Internal Medicine

## 2011-06-20 ENCOUNTER — Encounter: Payer: Self-pay | Admitting: Family Medicine

## 2011-06-20 ENCOUNTER — Ambulatory Visit (INDEPENDENT_AMBULATORY_CARE_PROVIDER_SITE_OTHER): Payer: 59 | Admitting: Family Medicine

## 2011-06-20 ENCOUNTER — Encounter: Payer: Self-pay | Admitting: Internal Medicine

## 2011-06-20 VITALS — BP 128/70 | HR 73 | Temp 98.0°F | Ht 65.0 in | Wt 157.0 lb

## 2011-06-20 DIAGNOSIS — R109 Unspecified abdominal pain: Secondary | ICD-10-CM

## 2011-06-20 DIAGNOSIS — R1032 Left lower quadrant pain: Secondary | ICD-10-CM

## 2011-06-20 MED ORDER — CLORAZEPATE DIPOTASSIUM 7.5 MG PO TABS: 7.5000 mg | ORAL_TABLET | Freq: Every evening | ORAL | Status: DC | PRN

## 2011-06-20 MED ORDER — METRONIDAZOLE 250 MG PO TABS: 250.0000 mg | ORAL_TABLET | Freq: Two times a day (BID) | ORAL | Status: AC

## 2011-06-20 MED ORDER — CIPROFLOXACIN HCL 250 MG PO TABS: 250.0000 mg | ORAL_TABLET | Freq: Two times a day (BID) | ORAL | Status: AC

## 2011-06-20 MED ORDER — HYDROCHLOROTHIAZIDE 12.5 MG PO CAPS: 12.5000 mg | ORAL_CAPSULE | Freq: Every day | ORAL | Status: DC

## 2011-06-20 NOTE — Assessment & Plan Note (Signed)
Intermittent No fever Eating okay and bowels fairly normal Has had similar bouts in past and did well with antibiotics May be mild diverticulitis Will treat with cipro and flagyl

## 2011-06-20 NOTE — Progress Notes (Signed)
  Subjective:    Patient ID: Dominique Hall, female    DOB: 08/18/1935, 76 y.o.   MRN: [id number]  HPI Having recurrence of abdominal cramping--intermittent Not related to eating Started mostly suprapubic about 1 week ago No problems voiding--no urgency or dysuria Normal stools for the most part---no blood  Relates to eating bowl of pinto beans  No fever No nausea or vomiting Appetite is fairly normal  Ibuprofen last night seemed to help No known abdominal wall strain  Current Outpatient Prescriptions on File Prior to Visit  Medication Sig Dispense Refill  . Cholecalciferol (VITAMIN D) 1000 UNITS capsule Take 1,000 Units by mouth daily.        . clorazepate (TRANXENE) 7.5 MG tablet Take 7.5 mg by mouth at bedtime.        Marland Kitchen esomeprazole (NEXIUM) 40 MG capsule Take 40 mg by mouth daily before breakfast.          Allergies  Allergen Reactions  . Penicillins     REACTION: unspecified  . Rabeprazole Sodium     REACTION: constipated  . Sulfonamide Derivatives     REACTION: unspecified    Past Medical History  Diagnosis Date  . Allergy   . Arthritis   . Sleep disturbance   . Diverticulosis   . GERD (gastroesophageal reflux disease)   . Hiatal hernia   . HTN (hypertension)   . Osteopenia   . IBS (irritable bowel syndrome)   . Esophageal dysmotility   . Esophageal ring     Past Surgical History  Procedure Date  . Squamous cell carcinoma excision 6/12    Right leg--Dr Michela Pitcher  . Lumpectomy [phone]    benign right breast  . Appendectomy 1973  . Total abdominal hysterectomy w/ bilateral salpingoophorectomy 1973  . Kidney surgery 1970  . Esophagogastroduodenoscopy 07-2006    with dilation  . Cholecystectomy   . Abdominal hysterectomy     Family History  Problem Relation Age of Onset  . Stroke Mother   . Heart attack Father     History   Social History  . Marital Status: Single    Spouse Name: N/A    Number of Children: 2  . Years of Education: N/A    Occupational History  . consignment shop    Social History Main Topics  . Smoking status: Former Games developer  . Smokeless tobacco: Never Used  . Alcohol Use: Yes  . Drug Use: No  . Sexually Active: Not on file   Other Topics Concern  . Not on file   Social History Narrative  . No narrative on file   Review of Systems No cough  No SOB     Objective:   Physical Exam  Constitutional: She appears well-developed and well-nourished. No distress.  Neck: Normal range of motion.  Cardiovascular: Normal rate, regular rhythm and normal heart sounds.  Exam reveals no gallop.   No murmur heard. Pulmonary/Chest: Effort normal and breath sounds normal. No respiratory distress. She has no wheezes. She has no rales.  Abdominal: Soft. Bowel sounds are normal. She exhibits no distension. There is tenderness.       Mild LLQ tenderness  Lymphadenopathy:    She has no cervical adenopathy.          Assessment & Plan:

## 2011-06-21 NOTE — Progress Notes (Signed)
  Subjective:    Patient ID: Dominique Hall, female    DOB: 05/28/1935, 76 y.o.   MRN: [id number]  HPI  Error - no office visit.  Review of Systems     Objective:   Physical Exam        Assessment & Plan:

## 2011-08-02 ENCOUNTER — Other Ambulatory Visit: Payer: Self-pay | Admitting: *Deleted

## 2011-08-02 MED ORDER — ESOMEPRAZOLE MAGNESIUM 40 MG PO CPDR: 40.0000 mg | DELAYED_RELEASE_CAPSULE | Freq: Every day | ORAL | Status: DC

## 2011-08-14 ENCOUNTER — Ambulatory Visit (INDEPENDENT_AMBULATORY_CARE_PROVIDER_SITE_OTHER): Payer: Medicare Other | Admitting: Internal Medicine

## 2011-08-14 ENCOUNTER — Encounter: Payer: Self-pay | Admitting: Internal Medicine

## 2011-08-14 VITALS — BP 129/65 | HR 83 | Temp 98.9°F | Ht 65.0 in | Wt 155.0 lb

## 2011-08-14 DIAGNOSIS — F411 Generalized anxiety disorder: Secondary | ICD-10-CM

## 2011-08-14 DIAGNOSIS — I10 Essential (primary) hypertension: Secondary | ICD-10-CM

## 2011-08-14 DIAGNOSIS — R1032 Left lower quadrant pain: Secondary | ICD-10-CM

## 2011-08-14 DIAGNOSIS — Z23 Encounter for immunization: Secondary | ICD-10-CM

## 2011-08-14 DIAGNOSIS — Z0000 Encounter for general adult medical examination without abnormal findings: Secondary | ICD-10-CM | POA: Insufficient documentation

## 2011-08-14 LAB — CBC WITH DIFFERENTIAL/PLATELET
Basophils Relative: 0.1 % (ref 0.0–3.0)
Eosinophils Absolute: 0.1 10*3/uL (ref 0.0–0.7)
HCT: 38 % (ref 36.0–46.0)
Hemoglobin: 12.8 g/dL (ref 12.0–15.0)
Lymphocytes Relative: 9.6 % — ABNORMAL LOW (ref 12.0–46.0)
MCHC: 33.6 g/dL (ref 30.0–36.0)
MCV: 84.6 fl (ref 78.0–100.0)
Monocytes Absolute: 0.6 10*3/uL (ref 0.1–1.0)
Neutro Abs: 7.3 10*3/uL (ref 1.4–7.7)
RBC: 4.5 Mil/uL (ref 3.87–5.11)

## 2011-08-14 LAB — BASIC METABOLIC PANEL
CO2: 30 mEq/L (ref 19–32)
Chloride: 106 mEq/L (ref 96–112)
Potassium: 3.9 mEq/L (ref 3.5–5.1)
Sodium: 143 mEq/L (ref 135–145)

## 2011-08-14 LAB — HEPATIC FUNCTION PANEL
ALT: 22 U/L (ref 0–35)
Bilirubin, Direct: 0.1 mg/dL (ref 0.0–0.3)
Total Protein: 7.1 g/dL (ref 6.0–8.3)

## 2011-08-14 LAB — LIPID PANEL: Total CHOL/HDL Ratio: 2

## 2011-08-14 MED ORDER — ONDANSETRON 4 MG PO TBDP: 4.0000 mg | ORAL_TABLET | Freq: Three times a day (TID) | ORAL | Status: DC | PRN

## 2011-08-14 NOTE — Assessment & Plan Note (Signed)
Doing well Only rarely uses med at night

## 2011-08-14 NOTE — Assessment & Plan Note (Signed)
Doing well Discussed fitness and proper eating Rx for zostavax mammo next year (every 2 years)

## 2011-08-14 NOTE — Assessment & Plan Note (Signed)
Recurrence last night Doesn't seem like infection Rx for ondansetron for prn use

## 2011-08-14 NOTE — Assessment & Plan Note (Signed)
BP Readings from Last 3 Encounters:  08/14/11 129/65  06/20/11 128/70  08/08/10 120/70   Good control Due for labs

## 2011-08-14 NOTE — Progress Notes (Signed)
Subjective:    Patient ID: Dominique Hall, female    DOB: Mar 29, 1935, 76 y.o.   MRN: [id number]  HPI Had another stomach attack last night Was suprapubic pain---relates to after eating hot dog Felt flu-like this AM but feeling some better now No fever Feels better now   Recently has become irregular with bowels for first time None  for 2 days, then 3rd day will have loose large stool but only one No sig pain or blood Discussed trying fiber (she asks about activia--okay to try this)  Had squamous cell carcinoma removed from right calf Needed reoperation for non healing  Heartburn quiet on nexium Wonders about going off Discussed trying to wean off  Current Outpatient Prescriptions on File Prior to Visit  Medication Sig Dispense Refill  . Cholecalciferol (VITAMIN D) 1000 UNITS capsule Take 1,000 Units by mouth daily.        . clorazepate (TRANXENE) 7.5 MG tablet Take 1 tablet (7.5 mg total) by mouth at bedtime as needed for anxiety.  30 tablet  1  . esomeprazole (NEXIUM) 40 MG capsule Take 1 capsule (40 mg total) by mouth daily before breakfast.  90 capsule  3  . hydrochlorothiazide (,MICROZIDE/HYDRODIURIL,) 12.5 MG capsule Take 1 capsule (12.5 mg total) by mouth daily.  90 capsule  3    Allergies  Allergen Reactions  . Penicillins     REACTION: unspecified  . Rabeprazole Sodium     REACTION: constipated  . Sulfonamide Derivatives     REACTION: unspecified    Past Medical History  Diagnosis Date  . Allergy   . Arthritis   . Sleep disturbance   . Diverticulosis   . GERD (gastroesophageal reflux disease)   . Hiatal hernia   . HTN (hypertension)   . Osteopenia   . IBS (irritable bowel syndrome)   . Esophageal dysmotility   . Esophageal ring     Past Surgical History  Procedure Date  . Squamous cell carcinoma excision 6/12    Right leg--Dr Michela Pitcher  . Lumpectomy [phone]    benign right breast  . Appendectomy 1973  . Total abdominal hysterectomy w/ bilateral  salpingoophorectomy 1973  . Kidney surgery 1970  . Esophagogastroduodenoscopy 07-2006    with dilation  . Cholecystectomy   . Abdominal hysterectomy   . Squamous cell carcinoma excision 2012    right calf    Family History  Problem Relation Age of Onset  . Stroke Mother   . Heart attack Father     History   Social History  . Marital Status: Single    Spouse Name: N/A    Number of Children: 2  . Years of Education: N/A   Occupational History  . consignment shop    Social History Main Topics  . Smoking status: Former Games developer  . Smokeless tobacco: Never Used  . Alcohol Use: Yes  . Drug Use: No  . Sexually Active: Not on file   Other Topics Concern  . Not on file   Social History Narrative  . No narrative on file   Review of Systems  Constitutional: Negative for fatigue and unexpected weight change.       Wears seat belt  HENT: Positive for hearing loss. Negative for congestion, rhinorrhea, dental problem and tinnitus.        Regular with dentist  Eyes: Negative for visual disturbance.       No diplopia or unilateral vision loss  Respiratory: Negative for cough, chest tightness and shortness of  breath.   Cardiovascular: Positive for palpitations. Negative for chest pain and leg swelling.       Occ brief heart fluttering  Gastrointestinal: Positive for abdominal pain and diarrhea. Negative for nausea, vomiting and blood in stool.  Genitourinary: Negative for dysuria, frequency and difficulty urinating.       No sex due to husband  Musculoskeletal: Negative for back pain, joint swelling and arthralgias.  Skin: Negative for rash.       SCC this year  Neurological: Negative for dizziness, syncope, weakness, light-headedness, numbness and headaches.  Hematological: Negative for adenopathy. Does not bruise/bleed easily.  Psychiatric/Behavioral: Negative for sleep disturbance and dysphoric mood. The patient is not nervous/anxious.        Only occ uses chlorazepate        Objective:   Physical Exam  Constitutional: She is oriented to person, place, and time. She appears well-developed and well-nourished. No distress.  HENT:  Head: Normocephalic and atraumatic.  Right Ear: External ear normal.  Left Ear: External ear normal.  Mouth/Throat: Oropharynx is clear and moist. No oropharyngeal exudate.       TMs normal  Eyes: Conjunctivae and EOM are normal. Pupils are equal, round, and reactive to light.       Fundi benign  Neck: Normal range of motion. Neck supple. No thyromegaly present.  Cardiovascular: Normal rate, regular rhythm, normal heart sounds and intact distal pulses.  Exam reveals no gallop.   No murmur heard. Pulmonary/Chest: Effort normal and breath sounds normal. No respiratory distress. She has no wheezes. She has no rales.  Abdominal: She exhibits no mass. There is no tenderness.  Genitourinary:       No mass or breast lesions  Musculoskeletal: Normal range of motion. She exhibits no edema and no tenderness.  Lymphadenopathy:    She has no cervical adenopathy.    She has no axillary adenopathy.  Neurological: She is alert and oriented to person, place, and time. She exhibits normal muscle tone.       Normal strength and gait  Skin: No rash noted.  Psychiatric: She has a normal mood and affect. Her behavior is normal. Judgment and thought content normal.          Assessment & Plan:

## 2011-08-19 ENCOUNTER — Other Ambulatory Visit: Payer: Self-pay | Admitting: Internal Medicine

## 2011-08-20 ENCOUNTER — Encounter: Payer: Self-pay | Admitting: Internal Medicine

## 2011-08-20 NOTE — Telephone Encounter (Signed)
Okay to refill #20 x 0 Would recommend GI eval if this problem is persisting Has she seen someone in the past?

## 2011-08-20 NOTE — Telephone Encounter (Signed)
rx sent to pharmacy by e-script Patient has seen Dr. Leone Payor in the past

## 2011-08-20 NOTE — Telephone Encounter (Signed)
Ok to refill? Last refill 08/14/11

## 2011-10-01 ENCOUNTER — Ambulatory Visit (INDEPENDENT_AMBULATORY_CARE_PROVIDER_SITE_OTHER): Payer: Medicare Other | Admitting: Internal Medicine

## 2011-10-01 ENCOUNTER — Encounter: Payer: Self-pay | Admitting: Internal Medicine

## 2011-10-01 VITALS — BP 125/64 | HR 80 | Temp 97.9°F | Ht 65.0 in | Wt 155.0 lb

## 2011-10-01 DIAGNOSIS — J069 Acute upper respiratory infection, unspecified: Secondary | ICD-10-CM

## 2011-10-01 MED ORDER — AZITHROMYCIN 250 MG PO TABS: ORAL_TABLET | ORAL | Status: AC

## 2011-10-01 NOTE — Progress Notes (Signed)
Subjective:    Patient ID: Dominique Hall, female    DOB: 03-20-1935, 76 y.o.   MRN: [id number]  HPI Tends to get sick this time of year Symptoms started 2 days ago or so Noted fever yesterday Bad cough since last night Robitussin and ibuprofen--some help  Cough is loose--swallowing mucus Feels it is more tight today SOme SOB Bad sore throat---this was the initial symptom No ear pain  Current Outpatient Prescriptions on File Prior to Visit  Medication Sig Dispense Refill  . Cholecalciferol (VITAMIN D) 1000 UNITS capsule Take 1,000 Units by mouth daily.        . clorazepate (TRANXENE) 7.5 MG tablet Take 1 tablet (7.5 mg total) by mouth at bedtime as needed for anxiety.  30 tablet  1  . esomeprazole (NEXIUM) 40 MG capsule Take 1 capsule (40 mg total) by mouth daily before breakfast.  90 capsule  3  . hydrochlorothiazide (,MICROZIDE/HYDRODIURIL,) 12.5 MG capsule Take 1 capsule (12.5 mg total) by mouth daily.  90 capsule  3  . ondansetron (ZOFRAN-ODT) 4 MG disintegrating tablet DISSOLVE ONE TABLET IN MOUTH EVERY EIGHT HOURS AS NEEDED FOR NAUSEA  20 tablet  0    Allergies  Allergen Reactions  . Penicillins     REACTION: unspecified  . Rabeprazole Sodium     REACTION: constipated  . Sulfonamide Derivatives     REACTION: unspecified    Past Medical History  Diagnosis Date  . Allergy   . Arthritis   . Sleep disturbance   . Diverticulosis   . GERD (gastroesophageal reflux disease)   . Hiatal hernia   . HTN (hypertension)   . Osteopenia   . IBS (irritable bowel syndrome)   . Esophageal dysmotility   . Esophageal ring     Past Surgical History  Procedure Date  . Squamous cell carcinoma excision 6/12    Right leg--Dr Michela Pitcher  . Lumpectomy [phone]    benign right breast  . Appendectomy 1973  . Total abdominal hysterectomy w/ bilateral salpingoophorectomy 1973  . Kidney surgery 1970  . Esophagogastroduodenoscopy 07-2006    with dilation  . Cholecystectomy   . Abdominal  hysterectomy   . Squamous cell carcinoma excision 2012    right calf    Family History  Problem Relation Age of Onset  . Stroke Mother   . Heart attack Father     History   Social History  . Marital Status: Single    Spouse Name: N/A    Number of Children: 2  . Years of Education: N/A   Occupational History  . consignment shop    Social History Main Topics  . Smoking status: Former Games developer  . Smokeless tobacco: Never Used  . Alcohol Use: Yes  . Drug Use: No  . Sexually Active: Not on file   Other Topics Concern  . Not on file   Social History Narrative  . No narrative on file   Review of Systems No N/V Some loose stools yesterday and today Appetite is off No rash     Objective:   Physical Exam  Constitutional: She appears well-developed and well-nourished. No distress.       Hoarse  HENT:  Right Ear: External ear normal.  Left Ear: External ear normal.  Mouth/Throat: Oropharynx is clear and moist. No oropharyngeal exudate.       No sig sinus tenderness Mild nasal congestion TMs normal  Neck: Normal range of motion. Neck supple.  Pulmonary/Chest: Effort normal and breath sounds normal.  No respiratory distress. She has no wheezes. She has no rales.  Lymphadenopathy:    She has no cervical adenopathy.          Assessment & Plan:

## 2011-10-01 NOTE — Assessment & Plan Note (Signed)
Seems to be viral, esp with the laryngitis Discussed symptomatic care If worsens later in the week, she will fill z-pak (I urged her not to take it unless she wasn't improving after 7-10 days of illness)

## 2011-10-01 NOTE — Patient Instructions (Signed)
Please only take the antibiotic if you are not getting better by next weekend, or if you worsen with high fever and more productive cough

## 2011-12-31 ENCOUNTER — Other Ambulatory Visit: Payer: Self-pay | Admitting: *Deleted

## 2011-12-31 MED ORDER — CLORAZEPATE DIPOTASSIUM 7.5 MG PO TABS: 7.5000 mg | ORAL_TABLET | Freq: Every evening | ORAL | Status: DC | PRN

## 2011-12-31 NOTE — Telephone Encounter (Signed)
rx faxed to pharmacy manually, [facility]  

## 2011-12-31 NOTE — Telephone Encounter (Signed)
Rx written #90 x 0

## 2011-12-31 NOTE — Telephone Encounter (Signed)
Fax on your desk from [facility].

## 2012-01-14 ENCOUNTER — Ambulatory Visit (INDEPENDENT_AMBULATORY_CARE_PROVIDER_SITE_OTHER): Payer: Medicare Other | Admitting: Internal Medicine

## 2012-01-14 ENCOUNTER — Encounter: Payer: Self-pay | Admitting: Internal Medicine

## 2012-01-14 DIAGNOSIS — R197 Diarrhea, unspecified: Secondary | ICD-10-CM | POA: Insufficient documentation

## 2012-01-14 NOTE — Progress Notes (Signed)
Subjective:    Patient ID: Dominique Hall, female    DOB: April 13, 1935, 77 y.o.   MRN: [id number]  HPI Started last Wednesday-- 2/27 Nausea at first but no vomiting Loose stools----pure water Appetite is gone Fever over the weekend---didn't take but she could tell No blood in stool Started with ~8-10 stools per day at first----now less in past couple of days Urgency in AM also  Drinking lots of fluids---gatorade Hasn't taken any diarrheal meds Has had lots of acid reflux---increased nexium to bid  Lower abdominal "griping" Lots of gas at first---led to stool   Current Outpatient Prescriptions on File Prior to Visit  Medication Sig Dispense Refill  . Cholecalciferol (VITAMIN D) 1000 UNITS capsule Take 1,000 Units by mouth daily.        . clorazepate (TRANXENE) 7.5 MG tablet Take 1 tablet (7.5 mg total) by mouth at bedtime as needed for anxiety.  90 tablet  0  . esomeprazole (NEXIUM) 40 MG capsule Take 1 capsule (40 mg total) by mouth daily before breakfast.  90 capsule  3  . hydrochlorothiazide (,MICROZIDE/HYDRODIURIL,) 12.5 MG capsule Take 1 capsule (12.5 mg total) by mouth daily.  90 capsule  3  . ondansetron (ZOFRAN-ODT) 4 MG disintegrating tablet DISSOLVE ONE TABLET IN MOUTH EVERY EIGHT HOURS AS NEEDED FOR NAUSEA  20 tablet  0    Allergies  Allergen Reactions  . Penicillins     REACTION: unspecified  . Rabeprazole Sodium     REACTION: constipated  . Sulfonamide Derivatives     REACTION: unspecified    Past Medical History  Diagnosis Date  . Allergy   . Arthritis   . Sleep disturbance   . Diverticulosis   . GERD (gastroesophageal reflux disease)   . Hiatal hernia   . HTN (hypertension)   . Osteopenia   . IBS (irritable bowel syndrome)   . Esophageal dysmotility   . Esophageal ring     Past Surgical History  Procedure Date  . Squamous cell carcinoma excision 6/12    Right leg--Dr Michela Pitcher  . Lumpectomy [phone]    benign right breast  . Appendectomy 1973  .  Total abdominal hysterectomy w/ bilateral salpingoophorectomy 1973  . Kidney surgery 1970  . Esophagogastroduodenoscopy 07-2006    with dilation  . Cholecystectomy   . Abdominal hysterectomy   . Squamous cell carcinoma excision 2012    right calf    Family History  Problem Relation Age of Onset  . Stroke Mother   . Heart attack Father     History   Social History  . Marital Status: Single    Spouse Name: N/A    Number of Children: 2  . Years of Education: N/A   Occupational History  . consignment shop    Social History Main Topics  . Smoking status: Former Games developer  . Smokeless tobacco: Never Used  . Alcohol Use: Yes  . Drug Use: No  . Sexually Active: Not on file   Other Topics Concern  . Not on file   Social History Narrative  . No narrative on file   Review of Systems No dysuria or urgency No recent antibiotic rx    Objective:   Physical Exam  Constitutional: She appears well-developed and well-nourished. No distress.  Neck: Normal range of motion.  Pulmonary/Chest: Effort normal and breath sounds normal. No respiratory distress. She has no wheezes. She has no rales.  Abdominal: Soft. Bowel sounds are normal. She exhibits no distension and no mass. There  is tenderness. There is no rebound and no guarding.       Mild LLQ tenderness   Lymphadenopathy:    She has no cervical adenopathy.          Assessment & Plan:

## 2012-01-14 NOTE — Assessment & Plan Note (Signed)
Probably viral gastroenteritis Discussed that picture is not typical for diverticulitis and I didn't recommend antibiotics Discussed adding food slowly Stop the gatorade--may increase the diarrhea

## 2012-01-31 ENCOUNTER — Other Ambulatory Visit: Payer: Self-pay | Admitting: *Deleted

## 2012-01-31 NOTE — Telephone Encounter (Signed)
Patient requesting to switch pharmacies from [facility] to [facility]. I spoke with patient and advised that we manually faxed rx to [facility] on 12/31/11 and she should have enough to last. Pt states she will call and check on rx.

## 2012-01-31 NOTE — Telephone Encounter (Signed)
Yes, she should be set for a while now Can send to new pharmacy when this supply runs out if she doesn't want mail order next time

## 2012-01-31 NOTE — Telephone Encounter (Signed)
ok 

## 2012-04-02 ENCOUNTER — Ambulatory Visit: Payer: Medicare Other | Admitting: Internal Medicine

## 2012-04-21 ENCOUNTER — Other Ambulatory Visit: Payer: Self-pay | Admitting: Internal Medicine

## 2012-04-23 ENCOUNTER — Other Ambulatory Visit: Payer: Self-pay | Admitting: *Deleted

## 2012-04-23 NOTE — Telephone Encounter (Signed)
Has switched to Dr Dan Humphreys apparently Please send on to her

## 2012-04-23 NOTE — Telephone Encounter (Signed)
Form on your desk  

## 2012-04-23 NOTE — Telephone Encounter (Signed)
Spoke with patient and she's still your patient, pt thought about changing to Dr.Walker because of location but decided to stay with Dr.Letvak.

## 2012-04-24 MED ORDER — CLORAZEPATE DIPOTASSIUM 7.5 MG PO TABS: 7.5000 mg | ORAL_TABLET | Freq: Every evening | ORAL | Status: DC | PRN

## 2012-04-24 NOTE — Telephone Encounter (Signed)
rx faxed to pharmacy manually, [facility]

## 2012-04-24 NOTE — Telephone Encounter (Signed)
Okay to refill #90 x 0 

## 2012-05-08 ENCOUNTER — Ambulatory Visit (INDEPENDENT_AMBULATORY_CARE_PROVIDER_SITE_OTHER): Payer: Medicare Other | Admitting: Family Medicine

## 2012-05-08 ENCOUNTER — Telehealth: Payer: Self-pay | Admitting: Internal Medicine

## 2012-05-08 ENCOUNTER — Encounter: Payer: Self-pay | Admitting: Family Medicine

## 2012-05-08 VITALS — BP 120/72 | HR 78 | Temp 97.8°F | Ht 65.0 in | Wt 153.8 lb

## 2012-05-08 DIAGNOSIS — K5732 Diverticulitis of large intestine without perforation or abscess without bleeding: Secondary | ICD-10-CM

## 2012-05-08 DIAGNOSIS — R197 Diarrhea, unspecified: Secondary | ICD-10-CM

## 2012-05-08 DIAGNOSIS — K5792 Diverticulitis of intestine, part unspecified, without perforation or abscess without bleeding: Secondary | ICD-10-CM

## 2012-05-08 MED ORDER — METRONIDAZOLE 500 MG PO TABS: 500.0000 mg | ORAL_TABLET | Freq: Three times a day (TID) | ORAL | Status: AC

## 2012-05-08 MED ORDER — CIPROFLOXACIN HCL 750 MG PO TABS: 750.0000 mg | ORAL_TABLET | Freq: Two times a day (BID) | ORAL | Status: AC

## 2012-05-08 NOTE — Progress Notes (Signed)
Nature conservation officer at [facility] [address] [place] [address] Phone: [phone] Fax: [phone]   Patient Name: Dominique Hall Date of Birth: 1935-04-12 Age: 77 y.o. Medical Record Number: [id number] Gender: female Date of Encounter: 05/08/2012  History of Present Illness:  Dominique Hall is a 77 y.o. very pleasant female patient who presents with the following:  Will get diverticulitis about once in a year and ate a whole bag of popcorn. Has to think about what she is eating. Right now, will go to the bathroom aftereating. No blood. She has had about 4 stools a day. They are somewhat more watery compared to normal. She has not had any mucus in her stool. Does have a mild to moderate amount of lower abdominal pain, mostly on the LEFT side. She did historically eat a bag of popcorn several days ago.  She is currently afebrile. She is otherwise relatively healthy female. She has no other acute complaints.  Past Medical History, Surgical History, Social History, Family History, Problem List, Medications, and Allergies have been reviewed and updated if relevant.  Prior to Admission medications   Medication Sig Start Date End Date Taking? Authorizing Provider  Cholecalciferol (VITAMIN D) 1000 UNITS capsule Take 1,000 Units by mouth daily.     Yes Historical Provider, MD  clorazepate (TRANXENE) 7.5 MG tablet Take 1 tablet (7.5 mg total) by mouth at bedtime as needed for anxiety. 04/23/12  Yes Karie Schwalbe, MD  esomeprazole (NEXIUM) 40 MG capsule Take 1 capsule (40 mg total) by mouth daily before breakfast. 08/02/11  Yes Karie Schwalbe, MD  hydrochlorothiazide (MICROZIDE) 12.5 MG capsule TAKE 1 CAPSULE DAILY 04/21/12  Yes Karie Schwalbe, MD  ondansetron (ZOFRAN-ODT) 4 MG disintegrating tablet DISSOLVE ONE TABLET IN MOUTH EVERY EIGHT HOURS AS NEEDED FOR NAUSEA 08/19/11  Yes Karie Schwalbe, MD    Review of Systems: ROS: GEN: Acute illness details above GI:  Tolerating PO intake -- but much decreased GU: maintaining adequate hydration and urination Pulm: No SOB Interactive and getting along well at home.  Otherwise, ROS is as per the HPI.   Physical Examination: Filed Vitals:   05/08/12 1423  BP: 120/72  Pulse: 78  Temp: 97.8 F (36.6 C)   Filed Vitals:   05/08/12 1423  Height: 5\' 5"  (1.651 m)  Weight: 153 lb 12.8 oz (69.763 kg)   Body mass index is 25.59 kg/(m^2). Ideal Body Weight: Weight in (lb) to have BMI = 25: 149.9   GEN: WDWN, NAD, Non-toxic, A & O x 3 HEENT: Atraumatic, Normocephalic. Neck supple. No masses, No LAD. Ears and Nose: No external deformity. CV: RRR, No M/G/R. No JVD. No thrill. No extra heart sounds. PULM: CTA B, no wheezes, crackles, rhonchi. No retractions. No resp. distress. No accessory muscle use. ABD: S, moderate LLQ tenderness, ND, +BS. No rebound. No HSM. EXTR: No c/c/e NEURO Normal gait.  PSYCH: Normally interactive. Conversant. Not depressed or anxious appearing.  Calm demeanor.    Assessment and Plan:  1. Diverticulitis   2. Diarrhea    Classic diverticulitis, Cipro and Flagyl. P.o. Fluids.  Occasional Imodium would be reasonable for diarrhea  Orders Today: No orders of the defined types were placed in this encounter.    Medications Today: Meds ordered this encounter  Medications  . metroNIDAZOLE (FLAGYL) 500 MG tablet    Sig: Take 1 tablet (500 mg total) by mouth 3 (three) times daily.    Dispense:  30 tablet  Refill:  0  . ciprofloxacin (CIPRO) 750 MG tablet    Sig: Take 1 tablet (750 mg total) by mouth 2 (two) times daily.    Dispense:  20 tablet    Refill:  0     Hannah Beat, MD

## 2012-05-08 NOTE — Telephone Encounter (Signed)
Caller: Dominique Hall/Patient; PCP: Tillman Abide; CB#: [phone]; ; ; Call regarding Is Having Diarrhea and Stomach Cramps, Diverticulitis Issues;  Onset Tuesday 05/04/12. She states she ate a bag of popcorn and is having lower stomach pain. Everything she eats causes diarrhea. Stools today- x4 loose no blood noted.   She is able to ambulate.  Afebrile, No urinary issues.  Pain 3/10. Intermittent and occurs with any eating and she is unable to get away from the bathroom. Her last bout was last August 2012.  She has a left over Rx for Flagyl #9 left over exp 8/13. Did not advised medication. Emergent s/sx reviewed per Abdominal Pain protocol with exception of "Diarrhea lasting longer 24 hours despite home care". See in 24 hours. Appt scheduled to today 05/08/12 with Dr. Kerin Perna at 14:30 for care at [facility] office. Home care reviewed. Caller expressed understanding.

## 2012-07-28 ENCOUNTER — Other Ambulatory Visit: Payer: Self-pay | Admitting: Internal Medicine

## 2012-07-30 ENCOUNTER — Other Ambulatory Visit: Payer: Self-pay | Admitting: Internal Medicine

## 2012-08-04 ENCOUNTER — Telehealth: Payer: Self-pay | Admitting: Internal Medicine

## 2012-08-04 NOTE — Telephone Encounter (Signed)
° °  Caller: Dominique Hall/Patient; Patient Name: Dominique Hall; PCP: Tillman Abide [facility]); Best Callback Phone Number: [phone].  Calling to schedule she well annual visit.  She will call back 08/05/12.   Caller: [name]/Patient; Patient Name: Dominique Hall; PCP: Tillman Abide [facility]); Best Callback Phone Number: [phone].  Calling to schedule she well annual visit.  She will call back 08/05/12.  Patient/ [name] calling to schedule her annual well visit.  She will call back 08/05/12.

## 2012-08-05 NOTE — Telephone Encounter (Signed)
I spoke with patient and scheduled her appointment on 02/17/13.

## 2012-08-05 NOTE — Telephone Encounter (Signed)
Lyla Son can you please call pt to schedule

## 2012-09-05 ENCOUNTER — Ambulatory Visit (INDEPENDENT_AMBULATORY_CARE_PROVIDER_SITE_OTHER): Payer: Medicare Other | Admitting: Internal Medicine

## 2012-09-05 ENCOUNTER — Encounter: Payer: Self-pay | Admitting: Internal Medicine

## 2012-09-05 VITALS — BP 118/78 | HR 68 | Temp 98.6°F | Wt 160.0 lb

## 2012-09-05 DIAGNOSIS — R040 Epistaxis: Secondary | ICD-10-CM

## 2012-09-05 MED ORDER — AZITHROMYCIN 250 MG PO TABS: ORAL_TABLET | ORAL | Status: DC

## 2012-09-05 NOTE — Patient Instructions (Signed)
Try neosporin or vaseline on a q-tip and rub around the inside of your nose 3-4 times per day  Use the antibiotic if you develop fever or worsening cough and discharge

## 2012-09-05 NOTE — Assessment & Plan Note (Signed)
Has irritation on the septum May have early sinusitis also Discussed local care  z-pak Rx if thinks worsen

## 2012-09-05 NOTE — Progress Notes (Signed)
Subjective:    Patient ID: Dominique Hall, female    DOB: 10-23-1935, 77 y.o.   MRN: [id number]  HPI For 2 weeks, she has been aggravated with sinus symptoms Seemed worse this AM Very full This morning she had some blood when blowing nose  Some cough now--from drainage Has been swallowing the mucus Slight soreness in throat No SOB No fever  Hasn't used any meds for this  Current Outpatient Prescriptions on File Prior to Visit  Medication Sig Dispense Refill  . Cholecalciferol (VITAMIN D) 1000 UNITS capsule Take 1,000 Units by mouth daily.        . clorazepate (TRANXENE) 7.5 MG tablet Take 1 tablet (7.5 mg total) by mouth at bedtime as needed for anxiety.  90 tablet  0  . hydrochlorothiazide (MICROZIDE) 12.5 MG capsule TAKE 1 CAPSULE DAILY  90 capsule  3  . NEXIUM 40 MG capsule TAKE 1 CAPSULE DAILY BEFOREBREAKFAST  90 each  3  . DISCONTD: NEXIUM 40 MG capsule TAKE 1 CAPSULE DAILY BEFOREBREAKFAST  90 each  3    Allergies  Allergen Reactions  . Penicillins     REACTION: unspecified  . Rabeprazole Sodium     REACTION: constipated  . Sulfonamide Derivatives     REACTION: unspecified    Past Medical History  Diagnosis Date  . Allergy   . Arthritis   . Sleep disturbance   . Diverticulosis   . GERD (gastroesophageal reflux disease)   . Hiatal hernia   . HTN (hypertension)   . Osteopenia   . IBS (irritable bowel syndrome)   . Esophageal dysmotility   . Esophageal ring     Past Surgical History  Procedure Date  . Squamous cell carcinoma excision 6/12    Right leg--Dr Michela Pitcher  . Lumpectomy [phone]    benign right breast  . Appendectomy 1973  . Total abdominal hysterectomy w/ bilateral salpingoophorectomy 1973  . Kidney surgery 1970  . Esophagogastroduodenoscopy 07-2006    with dilation  . Cholecystectomy   . Abdominal hysterectomy   . Squamous cell carcinoma excision 2012    right calf    Family History  Problem Relation Age of Onset  . Stroke Mother   .  Heart attack Father     History   Social History  . Marital Status: Single    Spouse Name: N/A    Number of Children: 2  . Years of Education: N/A   Occupational History  . consignment shop    Social History Main Topics  . Smoking status: Former Games developer  . Smokeless tobacco: Never Used  . Alcohol Use: Yes  . Drug Use: No  . Sexually Active: Not on file   Other Topics Concern  . Not on file   Social History Narrative  . No narrative on file   Review of Systems Did spend outside on recent trip to [facility] No rash No vomiting or diarrhea Appetite is still okay    Objective:   Physical Exam  Constitutional: She appears well-developed and well-nourished. No distress.  HENT:  Mouth/Throat: Oropharynx is clear and moist. No oropharyngeal exudate.       Mild maxillary tenderness Moderate nasal inflammation bilaterally Bleeding spots along septum both sides  Neck: Normal range of motion. Neck supple.  Pulmonary/Chest: Effort normal and breath sounds normal. No respiratory distress. She has no wheezes. She has no rales.  Lymphadenopathy:    She has no cervical adenopathy.          Assessment &  Plan:

## 2012-09-22 ENCOUNTER — Other Ambulatory Visit: Payer: Self-pay | Admitting: *Deleted

## 2012-09-22 MED ORDER — CLORAZEPATE DIPOTASSIUM 7.5 MG PO TABS: 7.5000 mg | ORAL_TABLET | Freq: Every evening | ORAL | Status: DC | PRN

## 2012-09-22 NOTE — Telephone Encounter (Signed)
Form on your desk to be filled out, so I can fax back to mail order pharmacy

## 2012-09-22 NOTE — Telephone Encounter (Signed)
Form done for #90 x 1

## 2012-09-22 NOTE — Telephone Encounter (Signed)
.  rx faxed to pharmacy, manually. [facility]

## 2012-09-23 NOTE — Telephone Encounter (Signed)
Dominique Hall pharmacist [facility] request Dr Vassie Moselle' phone and address(was not on faxed form). Info given.

## 2012-10-28 ENCOUNTER — Ambulatory Visit (INDEPENDENT_AMBULATORY_CARE_PROVIDER_SITE_OTHER): Payer: Medicare Other | Admitting: Internal Medicine

## 2012-10-28 ENCOUNTER — Encounter: Payer: Self-pay | Admitting: Internal Medicine

## 2012-10-28 VITALS — BP 120/80 | HR 77 | Temp 98.3°F | Wt 159.0 lb

## 2012-10-28 DIAGNOSIS — J019 Acute sinusitis, unspecified: Secondary | ICD-10-CM

## 2012-10-28 MED ORDER — CEFUROXIME AXETIL 500 MG PO TABS: 500.0000 mg | ORAL_TABLET | Freq: Two times a day (BID) | ORAL | Status: DC

## 2012-10-28 NOTE — Assessment & Plan Note (Signed)
Recurrent Failed z-pak  Will try ceftin---pcn allergy is vague and not anaphylaxis levaquin if this doesn't work

## 2012-10-28 NOTE — Progress Notes (Signed)
  Subjective:    Patient ID: Dominique Hall, female    DOB: 23-Sep-1935, 77 y.o.   MRN: [id number]  HPI Having sinus problems again Bad left frontal pain---by eye Both ears are hurting---esp feels fluid in right ear Nasal drainage and congestion---yellow mucus Started about 1 week ago---with sneezing  Did improve with the z-pak last month but then relapsed No fever No sig cough No SOB  Has been using genteal drops in eyes Ibuprofen at night---has helped her sleep  Current Outpatient Prescriptions on File Prior to Visit  Medication Sig Dispense Refill  . Cholecalciferol (VITAMIN D) 1000 UNITS capsule Take 1,000 Units by mouth daily.        . clorazepate (TRANXENE) 7.5 MG tablet Take 1 tablet (7.5 mg total) by mouth at bedtime as needed for anxiety.  90 tablet  1  . hydrochlorothiazide (MICROZIDE) 12.5 MG capsule TAKE 1 CAPSULE DAILY  90 capsule  3  . NEXIUM 40 MG capsule TAKE 1 CAPSULE DAILY BEFOREBREAKFAST  90 each  3    Allergies  Allergen Reactions  . Penicillins     REACTION: unspecified  . Rabeprazole Sodium     REACTION: constipated  . Sulfonamide Derivatives     REACTION: unspecified    Past Medical History  Diagnosis Date  . Allergy   . Arthritis   . Sleep disturbance   . Diverticulosis   . GERD (gastroesophageal reflux disease)   . Hiatal hernia   . HTN (hypertension)   . Osteopenia   . IBS (irritable bowel syndrome)   . Esophageal dysmotility   . Esophageal ring     Past Surgical History  Procedure Date  . Squamous cell carcinoma excision 6/12    Right leg--Dr Michela Pitcher  . Lumpectomy [phone]    benign right breast  . Appendectomy 1973  . Total abdominal hysterectomy w/ bilateral salpingoophorectomy 1973  . Kidney surgery 1970  . Esophagogastroduodenoscopy 07-2006    with dilation  . Cholecystectomy   . Abdominal hysterectomy   . Squamous cell carcinoma excision 2012    right calf    Family History  Problem Relation Age of Onset  . Stroke Mother    . Heart attack Father     History   Social History  . Marital Status: Single    Spouse Name: N/A    Number of Children: 2  . Years of Education: N/A   Occupational History  . consignment shop    Social History Main Topics  . Smoking status: Former Games developer  . Smokeless tobacco: Never Used  . Alcohol Use: Yes  . Drug Use: No  . Sexually Active: Not on file   Other Topics Concern  . Not on file   Social History Narrative  . No narrative on file   Review of Systems No rash No vomiting or diarrhea Appetite is okay    Objective:   Physical Exam  Constitutional: She appears well-developed and well-nourished. No distress.  HENT:  Mouth/Throat: Oropharynx is clear and moist. No oropharyngeal exudate.       Mild frontal tenderness Moderate nasal inflammation TMs normal  Neck: Normal range of motion. Neck supple. No thyromegaly present.  Pulmonary/Chest: Effort normal and breath sounds normal. No respiratory distress. She has no wheezes. She has no rales.  Lymphadenopathy:    She has no cervical adenopathy.          Assessment & Plan:

## 2012-10-29 ENCOUNTER — Telehealth: Payer: Self-pay

## 2012-10-29 MED ORDER — CEFUROXIME AXETIL 500 MG PO TABS: 500.0000 mg | ORAL_TABLET | Freq: Two times a day (BID) | ORAL | Status: DC

## 2012-10-29 NOTE — Telephone Encounter (Signed)
rx sent to pharmacy by e-script Spoke with patient and advised results   

## 2012-10-29 NOTE — Telephone Encounter (Signed)
Pt seen 10/28/12 and had Ceftin filled at pharmacy. Pt cannot find Ceftin today; thinks may have accidentally gotten into recycle bin that has already been picked up today. Pt request refill to [facility].

## 2012-10-29 NOTE — Telephone Encounter (Signed)
Okay to refill the same dose

## 2012-10-30 ENCOUNTER — Telehealth: Payer: Self-pay | Admitting: Internal Medicine

## 2012-10-30 ENCOUNTER — Encounter: Payer: Self-pay | Admitting: Internal Medicine

## 2012-10-30 MED ORDER — AZITHROMYCIN 500 MG PO TABS: 500.0000 mg | ORAL_TABLET | Freq: Every day | ORAL | Status: DC

## 2012-10-30 NOTE — Telephone Encounter (Signed)
Called and spoke to husband after she sent me MyChart note about terrible diarrhea on ceftin  Will stop this Try the azithromycin again but at higher dose-- 500mg  daily for 5 days

## 2012-12-05 ENCOUNTER — Encounter: Payer: Self-pay | Admitting: Internal Medicine

## 2012-12-05 ENCOUNTER — Ambulatory Visit (INDEPENDENT_AMBULATORY_CARE_PROVIDER_SITE_OTHER): Payer: Medicare Other | Admitting: Internal Medicine

## 2012-12-05 VITALS — BP 128/70 | HR 77 | Temp 98.1°F | Wt 156.0 lb

## 2012-12-05 DIAGNOSIS — N6019 Diffuse cystic mastopathy of unspecified breast: Secondary | ICD-10-CM | POA: Insufficient documentation

## 2012-12-05 DIAGNOSIS — Z1231 Encounter for screening mammogram for malignant neoplasm of breast: Secondary | ICD-10-CM

## 2012-12-05 NOTE — Progress Notes (Signed)
  Subjective:    Patient ID: Dominique Hall, female    DOB: 05-Nov-1935, 78 y.o.   MRN: [id number]  HPI Has a spot on her right breast Noted in past---sore---but then resolved Felt it sore again yesterday AM Feels a mass in their ---like pea size No discharge  No mammogram since 2011 (as far as I can tell)  No axillary adenopathy  Current Outpatient Prescriptions on File Prior to Visit  Medication Sig Dispense Refill  . Cholecalciferol (VITAMIN D) 1000 UNITS capsule Take 1,000 Units by mouth daily.        . clorazepate (TRANXENE) 7.5 MG tablet Take 1 tablet (7.5 mg total) by mouth at bedtime as needed for anxiety.  90 tablet  1  . hydrochlorothiazide (MICROZIDE) 12.5 MG capsule TAKE 1 CAPSULE DAILY  90 capsule  3  . NEXIUM 40 MG capsule TAKE 1 CAPSULE DAILY BEFOREBREAKFAST  90 each  3    Allergies  Allergen Reactions  . Penicillins     REACTION: unspecified  . Rabeprazole Sodium     REACTION: constipated  . Sulfonamide Derivatives     REACTION: unspecified  . Cefuroxime     diarrhea    Past Medical History  Diagnosis Date  . Allergy   . Arthritis   . Sleep disturbance   . Diverticulosis   . GERD (gastroesophageal reflux disease)   . Hiatal hernia   . HTN (hypertension)   . Osteopenia   . IBS (irritable bowel syndrome)   . Esophageal dysmotility   . Esophageal ring     Past Surgical History  Procedure Date  . Squamous cell carcinoma excision 6/12    Right leg--Dr Michela Pitcher  . Lumpectomy [phone]    benign right breast  . Appendectomy 1973  . Total abdominal hysterectomy w/ bilateral salpingoophorectomy 1973  . Kidney surgery 1970  . Esophagogastroduodenoscopy 07-2006    with dilation  . Cholecystectomy   . Abdominal hysterectomy   . Squamous cell carcinoma excision 2012    right calf    Family History  Problem Relation Age of Onset  . Stroke Mother   . Heart attack Father     History   Social History  . Marital Status: Single    Spouse Name: N/A   Number of Children: 2  . Years of Education: N/A   Occupational History  . consignment shop    Social History Main Topics  . Smoking status: Former Games developer  . Smokeless tobacco: Never Used  . Alcohol Use: Yes  . Drug Use: No  . Sexually Active: Not on file   Other Topics Concern  . Not on file   Social History Narrative  . No narrative on file   Review of Systems No fever No redness or warmth in lesion     Objective:   Physical Exam  Genitourinary:       Widespread, mildly tender cystic areas in upper and mid portions of right breast. No discharge No masses on left  Lymphadenopathy:    She has no axillary adenopathy.          Assessment & Plan:

## 2012-12-05 NOTE — Assessment & Plan Note (Signed)
Reassured that the exam was not worrisome Will proceed with standard screening mammo and follow up only if that is concerning

## 2012-12-27 ENCOUNTER — Other Ambulatory Visit: Payer: Self-pay

## 2013-01-01 ENCOUNTER — Ambulatory Visit: Admission: RE | Admit: 2013-01-01 | Payer: Medicare Other | Source: Ambulatory Visit

## 2013-01-01 ENCOUNTER — Other Ambulatory Visit: Payer: Self-pay | Admitting: Internal Medicine

## 2013-01-01 ENCOUNTER — Ambulatory Visit
Admission: RE | Admit: 2013-01-01 | Discharge: 2013-01-01 | Disposition: A | Discharge status: Home / Self Care | Payer: Medicare Other | Source: Ambulatory Visit | Attending: Internal Medicine | Admitting: Internal Medicine

## 2013-01-01 DIAGNOSIS — Z1231 Encounter for screening mammogram for malignant neoplasm of breast: Secondary | ICD-10-CM

## 2013-01-03 ENCOUNTER — Encounter: Payer: Self-pay | Admitting: Internal Medicine

## 2013-01-27 ENCOUNTER — Encounter: Payer: Self-pay | Admitting: Internal Medicine

## 2013-02-17 ENCOUNTER — Ambulatory Visit (INDEPENDENT_AMBULATORY_CARE_PROVIDER_SITE_OTHER): Payer: Medicare Other | Admitting: Internal Medicine

## 2013-02-17 ENCOUNTER — Encounter: Payer: Self-pay | Admitting: Internal Medicine

## 2013-02-17 VITALS — BP 118/70 | HR 58 | Temp 98.1°F | Wt 148.0 lb

## 2013-02-17 DIAGNOSIS — I10 Essential (primary) hypertension: Secondary | ICD-10-CM

## 2013-02-17 DIAGNOSIS — Z0000 Encounter for general adult medical examination without abnormal findings: Secondary | ICD-10-CM

## 2013-02-17 DIAGNOSIS — L570 Actinic keratosis: Secondary | ICD-10-CM

## 2013-02-17 DIAGNOSIS — K219 Gastro-esophageal reflux disease without esophagitis: Secondary | ICD-10-CM

## 2013-02-17 DIAGNOSIS — F411 Generalized anxiety disorder: Secondary | ICD-10-CM

## 2013-02-17 LAB — CBC WITH DIFFERENTIAL/PLATELET
Basophils Relative: 0.3 % (ref 0.0–3.0)
Eosinophils Relative: 1.4 % (ref 0.0–5.0)
Hemoglobin: 13 g/dL (ref 12.0–15.0)
Lymphocytes Relative: 23.9 % (ref 12.0–46.0)
MCHC: 33.9 g/dL (ref 30.0–36.0)
MCV: 84.3 fl (ref 78.0–100.0)
Monocytes Absolute: 0.3 10*3/uL (ref 0.1–1.0)
Neutro Abs: 3 10*3/uL (ref 1.4–7.7)
Neutrophils Relative %: 67.6 % (ref 43.0–77.0)
RBC: 4.53 Mil/uL (ref 3.87–5.11)
WBC: 4.4 10*3/uL — ABNORMAL LOW (ref 4.5–10.5)

## 2013-02-17 LAB — BASIC METABOLIC PANEL
CO2: 28 mEq/L (ref 19–32)
Chloride: 103 mEq/L (ref 96–112)
Creatinine, Ser: 0.7 mg/dL (ref 0.4–1.2)
Sodium: 140 mEq/L (ref 135–145)

## 2013-02-17 LAB — HEPATIC FUNCTION PANEL
ALT: 18 U/L (ref 0–35)
AST: 19 U/L (ref 0–37)
Albumin: 4.2 g/dL (ref 3.5–5.2)
Total Protein: 6.8 g/dL (ref 6.0–8.3)

## 2013-02-17 MED ORDER — CLORAZEPATE DIPOTASSIUM 7.5 MG PO TABS: 7.5000 mg | ORAL_TABLET | Freq: Every evening | ORAL | Status: DC | PRN

## 2013-02-17 NOTE — Assessment & Plan Note (Signed)
Quiet on nexium 

## 2013-02-17 NOTE — Assessment & Plan Note (Signed)
BP Readings from Last 3 Encounters:  02/17/13 118/70  12/05/12 128/70  10/28/12 120/80   Good control No changes

## 2013-02-17 NOTE — Assessment & Plan Note (Signed)
Uses the clorazepate as needed

## 2013-02-17 NOTE — Assessment & Plan Note (Signed)
Liquid nitrogen 45 seconds x 2 Painful but tolerated Discussed home care To derm if this recurs

## 2013-02-17 NOTE — Progress Notes (Signed)
Subjective:    Patient ID: Dominique Hall, female    DOB: [birth year], 78 y.o.   MRN: [id number]  HPI Here for physical  Has had problems with bowels---"completely changed" Had been regular in past--now can go 3 days without going--then will go and really let loose Does have mild discomfort  Has cut back on eating and only 1 sweet per day Weight is down and Dominique Hall feels better  Has a spot on upper chest that is red Itchy---using temovate  Current Outpatient Prescriptions on File Prior to Visit  Medication Sig Dispense Refill  . Cholecalciferol (VITAMIN D) 1000 UNITS capsule Take 1,000 Units by mouth daily.        . clorazepate (TRANXENE) 7.5 MG tablet Take 1 tablet (7.5 mg total) by mouth at bedtime as needed for anxiety.  90 tablet  1  . hydrochlorothiazide (MICROZIDE) 12.5 MG capsule TAKE 1 CAPSULE DAILY  90 capsule  3  . NEXIUM 40 MG capsule TAKE 1 CAPSULE DAILY BEFOREBREAKFAST  90 each  3   No current facility-administered medications on file prior to visit.    Allergies  Allergen Reactions  . Penicillins     REACTION: unspecified  . Rabeprazole Sodium     REACTION: constipated  . Sulfonamide Derivatives     REACTION: unspecified  . Cefuroxime     diarrhea    Past Medical History  Diagnosis Date  . Allergy   . Arthritis   . Sleep disturbance   . Diverticulosis   . GERD (gastroesophageal reflux disease)   . Hiatal hernia   . HTN (hypertension)   . Osteopenia   . IBS (irritable bowel syndrome)   . Esophageal dysmotility   . Esophageal ring     Past Surgical History  Procedure Laterality Date  . Squamous cell carcinoma excision  6/12    Right leg--Dr Michela Pitcher  . Lumpectomy  [phone]    benign right breast  . Appendectomy  1973  . Total abdominal hysterectomy w/ bilateral salpingoophorectomy  1973  . Kidney surgery  1970  . Esophagogastroduodenoscopy  07-2006    with dilation  . Cholecystectomy    . Abdominal hysterectomy    . Squamous cell carcinoma excision   2012    right calf    Family History  Problem Relation Age of Onset  . Stroke Mother   . Heart attack Father     History   Social History  . Marital Status: Single    Spouse Name: N/A    Number of Children: 2  . Years of Education: N/A   Occupational History  . consignment shop    Social History Main Topics  . Smoking status: Former Games developer  . Smokeless tobacco: Never Used  . Alcohol Use: Yes  . Drug Use: No  . Sexually Active: Not on file   Other Topics Concern  . Not on file   Social History Narrative   Has living will   Husband is [place] [facility] POA   Would accept resuscitation   Probably would not want tube feedings if cognitively unaware   Review of Systems  Constitutional: Negative for fatigue.       Wears seat belt  HENT: Positive for hearing loss. Negative for congestion, rhinorrhea, dental problem and tinnitus.        Has lost some hearing in right ear Regular with dentist  Eyes: Negative for visual disturbance.       No diplopia or unilateral vision loss  Respiratory: Negative  for cough, chest tightness and shortness of breath.   Cardiovascular: Negative for chest pain, palpitations and leg swelling.  Endocrine: Negative for cold intolerance and heat intolerance.  Genitourinary: Negative for difficulty urinating.       No incontinence No sex --no problem  Musculoskeletal: Positive for arthralgias. Negative for back pain and joint swelling.       Bilateral plantar fasciitis---sees Dr Al Corpus Some shoulder pain at times---not persistent  Skin: Negative for rash.       Sees derm yearly due to past cancer  Allergic/Immunologic: Negative for environmental allergies and immunocompromised state.  Neurological: Negative for dizziness, syncope, weakness, light-headedness, numbness and headaches.  Hematological: Negative for adenopathy. Bruises/bleeds easily.  Psychiatric/Behavioral: Negative for sleep disturbance and dysphoric mood. The patient is not  nervous/anxious.        Objective:   Physical Exam  Constitutional: Dominique Hall is oriented to person, place, and time. Dominique Hall appears well-developed and well-nourished. No distress.  HENT:  Head: Normocephalic and atraumatic.  Right Ear: External ear normal.  Left Ear: External ear normal.  Mouth/Throat: Oropharynx is clear and moist. No oropharyngeal exudate.  Eyes: Conjunctivae and EOM are normal. Pupils are equal, round, and reactive to light.  Neck: Normal range of motion. Neck supple. No thyromegaly present.  Cardiovascular: Normal rate, regular rhythm, normal heart sounds and intact distal pulses.  Exam reveals no gallop.   No murmur heard. Pulmonary/Chest: Effort normal and breath sounds normal. No respiratory distress. Dominique Hall has no wheezes. Dominique Hall has no rales.  Abdominal: Soft. There is no tenderness.  Musculoskeletal: Dominique Hall exhibits no edema and no tenderness.  Lymphadenopathy:    Dominique Hall has no cervical adenopathy.  Neurological: Dominique Hall is alert and oriented to person, place, and time.  Skin: No rash noted.  Red, scaly lesion ~84mm diameter-- on chest to right of sternum  Psychiatric: Dominique Hall has a normal mood and affect. Her behavior is normal.          Assessment & Plan:

## 2013-02-17 NOTE — Assessment & Plan Note (Signed)
Generally healthy UTD on imms and cancer screening

## 2013-02-17 NOTE — Patient Instructions (Addendum)
Please try a fiber supplement, senna S (1-2 tabs daily) or a probiotic. You could also consider miralax (like 1/2 capful with water daily)  If the red area on your chest returns, please see your dermatologist for further evaluation

## 2013-03-31 ENCOUNTER — Encounter: Payer: Self-pay | Admitting: Internal Medicine

## 2013-03-31 MED ORDER — CENTRUM SILVER PO TABS: 1.0000 | ORAL_TABLET | Freq: Every day | ORAL | Status: DC

## 2013-03-31 MED ORDER — RANITIDINE HCL 75 MG PO TABS: 75.0000 mg | ORAL_TABLET | Freq: Every day | ORAL | Status: DC

## 2013-03-31 NOTE — Telephone Encounter (Signed)
Will do!

## 2013-04-07 ENCOUNTER — Other Ambulatory Visit: Payer: Self-pay | Admitting: *Deleted

## 2013-04-07 MED ORDER — RANITIDINE HCL 150 MG PO TABS: 150.0000 mg | ORAL_TABLET | Freq: Every day | ORAL | Status: DC

## 2013-06-23 ENCOUNTER — Encounter: Payer: Self-pay | Admitting: Internal Medicine

## 2013-06-23 MED ORDER — HYDROCHLOROTHIAZIDE 12.5 MG PO CAPS: 12.5000 mg | ORAL_CAPSULE | ORAL | Status: DC

## 2013-07-14 ENCOUNTER — Encounter: Payer: Self-pay | Admitting: Family Medicine

## 2013-07-14 ENCOUNTER — Ambulatory Visit (INDEPENDENT_AMBULATORY_CARE_PROVIDER_SITE_OTHER): Payer: Medicare Other | Admitting: Family Medicine

## 2013-07-14 VITALS — BP 128/70 | HR 81 | Temp 98.5°F | Ht 65.0 in | Wt 140.5 lb

## 2013-07-14 DIAGNOSIS — J069 Acute upper respiratory infection, unspecified: Secondary | ICD-10-CM | POA: Insufficient documentation

## 2013-07-14 MED ORDER — AZITHROMYCIN 250 MG PO TABS: ORAL_TABLET | ORAL | Status: DC

## 2013-07-14 NOTE — Progress Notes (Signed)
  Subjective:    Patient ID: Dominique Hall, female    DOB: 09-29-1935, 78 y.o.   MRN: [id number]  Cough This is a new problem. The current episode started in the past 7 days (2 days). The problem has been gradually worsening. The problem occurs constantly. The cough is productive of sputum. Associated symptoms include ear congestion, ear pain, nasal congestion and a sore throat. Pertinent negatives include no chills, fever, headaches, myalgias, rhinorrhea, shortness of breath, weight loss or wheezing. Associated symptoms comments: Sinus pressure and pain, purulent nasal discharge.. Exacerbated by: sick contact husband. Treatments tried: ibuprofen, robiutussin, coldeeze. The treatment provided mild relief. Her past medical history is significant for environmental allergies. There is no history of asthma, bronchiectasis, bronchitis, COPD, emphysema or pneumonia. former smoker 23 pack year history but remote      Review of Systems  Constitutional: Negative for fever, chills and weight loss.  HENT: Positive for ear pain and sore throat. Negative for rhinorrhea.   Respiratory: Positive for cough. Negative for shortness of breath and wheezing.   Musculoskeletal: Negative for myalgias.  Allergic/Immunologic: Positive for environmental allergies.  Neurological: Negative for headaches.       Objective:   Physical Exam  Constitutional: Vital signs are normal. She appears well-developed and well-nourished. She is cooperative.  Non-toxic appearance. She does not appear ill. No distress.  HENT:  Head: Normocephalic.  Right Ear: Hearing, tympanic membrane, external ear and ear canal normal. Tympanic membrane is not erythematous, not retracted and not bulging.  Left Ear: Hearing, tympanic membrane, external ear and ear canal normal. Tympanic membrane is not erythematous, not retracted and not bulging.  Nose: Mucosal edema and rhinorrhea present. Right sinus exhibits no maxillary sinus tenderness and no  frontal sinus tenderness. Left sinus exhibits no maxillary sinus tenderness and no frontal sinus tenderness.  Mouth/Throat: Uvula is midline, oropharynx is clear and moist and mucous membranes are normal.  Eyes: Conjunctivae, EOM and lids are normal. Pupils are equal, round, and reactive to light. Lids are everted and swept, no foreign bodies found.  Neck: Trachea normal and normal range of motion. Neck supple. Carotid bruit is not present. No mass and no thyromegaly present.  Cardiovascular: Normal rate, regular rhythm, S1 normal, S2 normal, normal heart sounds, intact distal pulses and normal pulses.  Exam reveals no gallop and no friction rub.   No murmur heard. Pulmonary/Chest: Effort normal and breath sounds normal. Not tachypneic. No respiratory distress. She has no decreased breath sounds. She has no wheezes. She has no rhonchi. She has no rales.  Neurological: She is alert.  Skin: Skin is warm, dry and intact. No rash noted.  Psychiatric: Her speech is normal and behavior is normal. Judgment normal. Her mood appears not anxious. Cognition and memory are normal. She does not exhibit a depressed mood.          Assessment & Plan:

## 2013-07-14 NOTE — Progress Notes (Signed)
Spoke to [place] at [facility] and advised them to cancel the order sent in for Zithromax.  Order cancelled per Tarana.

## 2013-07-14 NOTE — Patient Instructions (Addendum)
Mucinex DM twice daily for cough, nasal saline irrigation or spray  2-3 times a day. If not feeling better in 3-4 days, can start Z-pak.  Call if not improving if not improved.

## 2013-07-14 NOTE — Assessment & Plan Note (Signed)
No clear sign of bacterial infection but given pt concern/anxiety , age and smoking history ... Will give rx for antibiotics if she is not turning the corner in 3-4 days. Otherwise symptomatic care.

## 2013-08-06 ENCOUNTER — Encounter: Payer: Self-pay | Admitting: Internal Medicine

## 2013-08-06 ENCOUNTER — Ambulatory Visit (INDEPENDENT_AMBULATORY_CARE_PROVIDER_SITE_OTHER): Payer: Medicare Other | Admitting: Internal Medicine

## 2013-08-06 VITALS — BP 138/70 | HR 64 | Temp 98.4°F | Wt 141.0 lb

## 2013-08-06 DIAGNOSIS — L608 Other nail disorders: Secondary | ICD-10-CM

## 2013-08-06 DIAGNOSIS — L603 Nail dystrophy: Secondary | ICD-10-CM

## 2013-08-06 MED ORDER — ESOMEPRAZOLE MAGNESIUM 40 MG PO CPDR: 40.0000 mg | DELAYED_RELEASE_CAPSULE | Freq: Every day | ORAL | Status: DC

## 2013-08-06 NOTE — Assessment & Plan Note (Signed)
May be fungal No infection Discussed trying vick's vaporub

## 2013-08-06 NOTE — Progress Notes (Signed)
Subjective:    Patient ID: Dominique Hall, female    DOB: 1935-10-15, 78 y.o.   MRN: [id number]  HPI Having trouble with left great toe  About 6 months ago, she cut the nail and went too far in Used iodine for a few days and the pain got better  Now for 2 weeks, it looks wrong Concerned if this is something serious  Current Outpatient Prescriptions on File Prior to Visit  Medication Sig Dispense Refill  . Cholecalciferol (VITAMIN D) 1000 UNITS capsule Take 1,000 Units by mouth daily.        . clorazepate (TRANXENE) 7.5 MG tablet Take 1 tablet (7.5 mg total) by mouth at bedtime as needed for anxiety.  90 tablet  1  . hydrochlorothiazide (MICROZIDE) 12.5 MG capsule Take 1 capsule (12.5 mg total) by mouth every morning.  90 capsule  3  . Multiple Vitamins-Minerals (CENTRUM SILVER) tablet Take 1 tablet by mouth daily.  90 tablet  3  . NEXIUM 40 MG capsule TAKE 1 CAPSULE DAILY BEFOREBREAKFAST  90 each  3  . ranitidine (ZANTAC) 150 MG tablet Take 1 tablet (150 mg total) by mouth at bedtime.  90 tablet  3   No current facility-administered medications on file prior to visit.    Allergies  Allergen Reactions  . Penicillins     REACTION: unspecified  . Rabeprazole Sodium     REACTION: constipated  . Sulfonamide Derivatives     REACTION: unspecified  . Cefuroxime     diarrhea    Past Medical History  Diagnosis Date  . Allergy   . Arthritis   . Sleep disturbance   . Diverticulosis   . GERD (gastroesophageal reflux disease)   . Hiatal hernia   . HTN (hypertension)   . Osteopenia   . IBS (irritable bowel syndrome)   . Esophageal dysmotility   . Esophageal ring     Past Surgical History  Procedure Laterality Date  . Squamous cell carcinoma excision  6/12    Right leg--Dr Michela Pitcher  . Lumpectomy  [phone]    benign right breast  . Appendectomy  1973  . Total abdominal hysterectomy w/ bilateral salpingoophorectomy  1973  . Kidney surgery  1970  . Esophagogastroduodenoscopy   07-2006    with dilation  . Cholecystectomy    . Abdominal hysterectomy    . Squamous cell carcinoma excision  2012    right calf    Family History  Problem Relation Age of Onset  . Stroke Mother   . Heart attack Father     History   Social History  . Marital Status: Single    Spouse Name: N/A    Number of Children: 2  . Years of Education: N/A   Occupational History  . consignment shop    Social History Main Topics  . Smoking status: Former Games developer  . Smokeless tobacco: Never Used  . Alcohol Use: Yes  . Drug Use: No  . Sexual Activity: Not on file   Other Topics Concern  . Not on file   Social History Narrative   Has living will   Husband is [place] health care POA   Would accept resuscitation   Probably would not want tube feedings if cognitively unaware   Review of Systems No fever Feels fine    Objective:   Physical Exam  Constitutional: She appears well-developed and well-nourished. No distress.  Musculoskeletal:  Thickened left great toenail Slight redness at nailbed but no discharge or tenderness  Mild defect on lateral side but no ingrowing          Assessment & Plan:

## 2013-08-06 NOTE — Patient Instructions (Signed)
Please try vick's vaporub daily on your toenail.

## 2013-09-08 ENCOUNTER — Other Ambulatory Visit: Payer: Self-pay | Admitting: *Deleted

## 2013-09-08 MED ORDER — CLORAZEPATE DIPOTASSIUM 7.5 MG PO TABS: 7.5000 mg | ORAL_TABLET | Freq: Every evening | ORAL | Status: DC | PRN

## 2013-09-08 NOTE — Telephone Encounter (Signed)
Last filled 02/17/13 

## 2013-09-08 NOTE — Telephone Encounter (Signed)
Okay #[phone] x [phone] 

## 2013-09-08 NOTE — Telephone Encounter (Signed)
rx faxed to pharmacy manually [facility]

## 2013-09-17 ENCOUNTER — Other Ambulatory Visit: Payer: Self-pay

## 2013-10-09 ENCOUNTER — Ambulatory Visit (INDEPENDENT_AMBULATORY_CARE_PROVIDER_SITE_OTHER): Payer: Medicare Other | Admitting: Family Medicine

## 2013-10-09 ENCOUNTER — Encounter: Payer: Self-pay | Admitting: Family Medicine

## 2013-10-09 VITALS — BP 122/60 | HR 64 | Temp 97.9°F | Wt 138.2 lb

## 2013-10-09 DIAGNOSIS — J069 Acute upper respiratory infection, unspecified: Secondary | ICD-10-CM

## 2013-10-09 MED ORDER — AZITHROMYCIN 250 MG PO TABS: ORAL_TABLET | ORAL | Status: DC

## 2013-10-09 NOTE — Progress Notes (Signed)
Pre-visit discussion using our clinic review tool. No additional management support is needed unless otherwise documented below in the visit note.  Started the night before last.  Scratchy throat.  This AM with rhinorrhea, eye discharge x2, cough with discolored sputum.  "I feel not awful but not well."  No fevers.  No vomiting, diarrhea, rash.  No ear pain.    Meds, vitals, and allergies reviewed.   ROS: See HPI.  Otherwise, noncontributory.  GEN: nad, alert and oriented HEENT: mucous membranes moist, tm w/o erythema, nasal exam w/o erythema, clear discharge noted,  OP with cobblestoning, R max sinus minimally ttp NECK: supple w/o LA CV: rrr.   PULM: ctab, no inc wob EXT: no edema SKIN: no acute rash

## 2013-10-09 NOTE — Patient Instructions (Signed)
Drink plenty of fluids, take tylenol as needed, and gargle with warm salt water for your throat if needed.  I think this will gradually improve without the antibiotics but start them in a few days if you aren't better.  Take care.  Let us know if you have other concerns.

## 2013-10-09 NOTE — Assessment & Plan Note (Signed)
Nontoxic, ddx d/w pt.  She understood.  Likely viral.  WASP rx for abx, start if progressive and prolonged sx.  She agrees.  D/w pt about risk and benefit of abx use.  Okay for outpatient f/u.

## 2013-10-27 ENCOUNTER — Other Ambulatory Visit: Payer: Self-pay

## 2013-10-27 DIAGNOSIS — Z1231 Encounter for screening mammogram for malignant neoplasm of breast: Secondary | ICD-10-CM

## 2014-01-04 ENCOUNTER — Ambulatory Visit: Payer: Medicare Other

## 2014-01-05 ENCOUNTER — Other Ambulatory Visit: Payer: Self-pay | Admitting: *Deleted

## 2014-01-05 MED ORDER — RANITIDINE HCL 150 MG PO TABS: 150.0000 mg | ORAL_TABLET | Freq: Every day | ORAL | Status: DC

## 2014-01-13 ENCOUNTER — Other Ambulatory Visit: Payer: Self-pay | Admitting: Internal Medicine

## 2014-01-13 NOTE — Telephone Encounter (Signed)
09/08/13 

## 2014-01-13 NOTE — Telephone Encounter (Signed)
Okay #[phone] x [phone] 

## 2014-01-14 MED ORDER — CLORAZEPATE DIPOTASSIUM 7.5 MG PO TABS: 7.5000 mg | ORAL_TABLET | Freq: Every evening | ORAL | Status: DC | PRN

## 2014-01-14 NOTE — Telephone Encounter (Signed)
Spoke with patient and advised rx ready for pick-up and it will be at the front desk.  

## 2014-01-18 ENCOUNTER — Ambulatory Visit
Admission: RE | Admit: 2014-01-18 | Discharge: 2014-01-18 | Disposition: A | Discharge status: Home / Self Care | Payer: Medicare Other | Source: Ambulatory Visit

## 2014-01-18 ENCOUNTER — Other Ambulatory Visit: Payer: Self-pay

## 2014-01-18 DIAGNOSIS — Z1231 Encounter for screening mammogram for malignant neoplasm of breast: Secondary | ICD-10-CM

## 2014-01-28 ENCOUNTER — Telehealth: Payer: Self-pay | Admitting: Internal Medicine

## 2014-01-28 NOTE — Telephone Encounter (Signed)
error 

## 2014-01-29 ENCOUNTER — Ambulatory Visit (INDEPENDENT_AMBULATORY_CARE_PROVIDER_SITE_OTHER): Payer: Medicare Other | Admitting: Family Medicine

## 2014-01-29 ENCOUNTER — Encounter: Payer: Self-pay | Admitting: Family Medicine

## 2014-01-29 VITALS — BP 128/68 | HR 56 | Temp 97.7°F | Wt 138.5 lb

## 2014-01-29 DIAGNOSIS — N952 Postmenopausal atrophic vaginitis: Secondary | ICD-10-CM

## 2014-01-29 DIAGNOSIS — IMO0001 Reserved for inherently not codable concepts without codable children: Secondary | ICD-10-CM

## 2014-01-29 DIAGNOSIS — K5900 Constipation, unspecified: Secondary | ICD-10-CM

## 2014-01-29 DIAGNOSIS — N8111 Cystocele, midline: Secondary | ICD-10-CM

## 2014-01-29 DIAGNOSIS — IMO0002 Reserved for concepts with insufficient information to code with codable children: Secondary | ICD-10-CM

## 2014-01-29 MED ORDER — ESTRADIOL 0.1 MG/GM VA CREA: TOPICAL_CREAM | VAGINAL | Status: DC

## 2014-01-29 NOTE — Progress Notes (Signed)
   Subjective:    Patient ID: Dominique Hall, female    DOB: August 11, 1935, 79 y.o.   MRN: [id number]  HPI 79 year old female pt of Dr. Alla German presents for discomfort on her genital area in last 3 weeks. Mainly discomfort on the outside, but some on inside. Describes as burn. No dysuria. Not worsening. No rash, no redness, no mass. No vaginal discharge. No itching. Not sexually active.  She has not noted anything pushing out of vagina.  She has been having 3-4 months of bowel issues with constipation. BMs every 3-4 days. She strains with BMs.  Nml colonscopy in 2004, may have also had repeat in 2009.Marland Kitchen Per record uptodate.   Review of Systems  Constitutional: Negative for fever and fatigue.  HENT: Negative for ear pain.   Eyes: Negative for pain.  Respiratory: Negative for chest tightness and shortness of breath.   Cardiovascular: Negative for chest pain, palpitations and leg swelling.  Gastrointestinal: Negative for abdominal pain.  Genitourinary: Negative for dysuria.       Objective:   Physical Exam  Constitutional: Vital signs are normal. She appears well-developed and well-nourished. She is cooperative.  Non-toxic appearance. She does not appear ill. No distress.  HENT:  Head: Normocephalic.  Right Ear: Hearing, tympanic membrane, external ear and ear canal normal. Tympanic membrane is not erythematous, not retracted and not bulging.  Left Ear: Hearing, tympanic membrane, external ear and ear canal normal. Tympanic membrane is not erythematous, not retracted and not bulging.  Nose: No mucosal edema or rhinorrhea. Right sinus exhibits no maxillary sinus tenderness and no frontal sinus tenderness. Left sinus exhibits no maxillary sinus tenderness and no frontal sinus tenderness.  Mouth/Throat: Uvula is midline, oropharynx is clear and moist and mucous membranes are normal.  Eyes: Conjunctivae, EOM and lids are normal. Pupils are equal, round, and reactive to light. Lids are  everted and swept, no foreign bodies found.  Neck: Trachea normal and normal range of motion. Neck supple. Carotid bruit is not present. No mass and no thyromegaly present.  Cardiovascular: Normal rate, regular rhythm, S1 normal, S2 normal, normal heart sounds, intact distal pulses and normal pulses.  Exam reveals no gallop and no friction rub.   No murmur heard. Pulmonary/Chest: Effort normal and breath sounds normal. Not tachypneic. No respiratory distress. She has no decreased breath sounds. She has no wheezes. She has no rhonchi. She has no rales.  Abdominal: Soft. Normal appearance and bowel sounds are normal. There is no tenderness.  Genitourinary: Rectum normal. Rectal exam shows no external hemorrhoid. There is no rash, tenderness or lesion on the right labia. There is no rash, tenderness or lesion on the left labia. There is tenderness around the vagina. No erythema around the vagina. No foreign body around the vagina. There are signs of injury around the vagina. No vaginal discharge found.  Abrasion and evidence of atrophic vaginitis  Neurological: She is alert.  Skin: Skin is warm, dry and intact. No rash noted.  Psychiatric: Her speech is normal and behavior is normal. Judgment and thought content normal. Her mood appears not anxious. Cognition and memory are normal. She does not exhibit a depressed mood.          Assessment & Plan:

## 2014-01-29 NOTE — Patient Instructions (Signed)
Start vaginal estrogen as directed.  Start miralax 17 g daily until regular BMs.  Push fluids and water.  Avoid straining with BMS.  Call if symptoms not improved in next 2-4 weeks.

## 2014-01-29 NOTE — Progress Notes (Signed)
Pre visit review using our clinic review tool, if applicable. No additional management support is needed unless otherwise documented below in the visit note. 

## 2014-02-03 DIAGNOSIS — K5900 Constipation, unspecified: Secondary | ICD-10-CM | POA: Insufficient documentation

## 2014-02-03 NOTE — Assessment & Plan Note (Signed)
Miralax., water and fiber. Increase exercise.

## 2014-02-03 NOTE — Assessment & Plan Note (Signed)
Treat with topical estrogen. Follow up with PCP.

## 2014-02-03 NOTE — Assessment & Plan Note (Signed)
Likely contributing to pressure sensation and irritation.

## 2014-02-11 ENCOUNTER — Encounter: Payer: Self-pay | Admitting: Obstetrics & Gynecology

## 2014-02-11 ENCOUNTER — Ambulatory Visit (INDEPENDENT_AMBULATORY_CARE_PROVIDER_SITE_OTHER): Payer: Medicare Other | Admitting: Obstetrics & Gynecology

## 2014-02-11 VITALS — BP 136/69 | HR 60 | Ht 65.0 in | Wt 139.0 lb

## 2014-02-11 DIAGNOSIS — N9489 Other specified conditions associated with female genital organs and menstrual cycle: Secondary | ICD-10-CM

## 2014-02-11 DIAGNOSIS — N909 Noninflammatory disorder of vulva and perineum, unspecified: Secondary | ICD-10-CM

## 2014-02-11 MED ORDER — CLOBETASOL PROPIONATE 0.05 % EX OINT: TOPICAL_OINTMENT | CUTANEOUS | Status: DC

## 2014-02-11 NOTE — Progress Notes (Signed)
   Subjective:    Patient ID: Dominique Hall, female    DOB: 06-03-1935, 79 y.o.   MRN: [id number]  HPI  This lovely MW 79 yo abstinent lady is here with a 4-5 month h/o vulvar burning. She denies remembering any changes in detergents,soaps, etc 4-5 months ago. She was started on vaginal estrogen 1-2 weeks ago, but she has not put it on the vulva.   Review of Systems     Objective:   Physical Exam Generalized erythema of both labia majora. The remainder of the vulva and vagina is normal, some atrophy.       Assessment & Plan:  Possible/probable contact dermatitis. I will add clobetasol and I have encouraged her to use some of her estrogen cream on the vulva. I will see her back in 6 weeks. If no improvement, recommend biopsy.

## 2014-02-16 ENCOUNTER — Other Ambulatory Visit (INDEPENDENT_AMBULATORY_CARE_PROVIDER_SITE_OTHER): Payer: Medicare Other | Admitting: *Deleted

## 2014-02-16 DIAGNOSIS — N390 Urinary tract infection, site not specified: Secondary | ICD-10-CM

## 2014-02-16 LAB — POCT URINALYSIS DIPSTICK
BILIRUBIN UA: NEGATIVE
Glucose, UA: NEGATIVE
KETONES UA: NEGATIVE
Nitrite, UA: NEGATIVE
Protein, UA: NEGATIVE
Spec Grav, UA: 1.02
Urobilinogen, UA: NEGATIVE
pH, UA: 6

## 2014-02-16 MED ORDER — NITROFURANTOIN MONOHYD MACRO 100 MG PO CAPS: 100.0000 mg | ORAL_CAPSULE | Freq: Two times a day (BID) | ORAL | Status: DC

## 2014-02-16 NOTE — Progress Notes (Signed)
Pt is having symptoms of a UTI.  Urine Dipstick is positive for a UTI.  I will send off a urine culture.  I have sent Macrobid to patients pharmacy.  Pt aware.

## 2014-02-16 NOTE — Addendum Note (Signed)
Addended by: Lin Landsman C on: 02/16/2014 10:18 AM   Modules accepted: Orders

## 2014-02-20 LAB — URINE CULTURE: Colony Count: 85000

## 2014-02-21 ENCOUNTER — Other Ambulatory Visit: Payer: Self-pay | Admitting: Family Medicine

## 2014-02-22 ENCOUNTER — Encounter: Payer: Self-pay | Admitting: Internal Medicine

## 2014-02-22 ENCOUNTER — Ambulatory Visit (INDEPENDENT_AMBULATORY_CARE_PROVIDER_SITE_OTHER): Payer: Medicare Other | Admitting: Internal Medicine

## 2014-02-22 VITALS — BP 118/70 | HR 71 | Temp 98.2°F | Ht 65.0 in | Wt 137.0 lb

## 2014-02-22 DIAGNOSIS — K219 Gastro-esophageal reflux disease without esophagitis: Secondary | ICD-10-CM

## 2014-02-22 DIAGNOSIS — Z0000 Encounter for general adult medical examination without abnormal findings: Secondary | ICD-10-CM

## 2014-02-22 DIAGNOSIS — I10 Essential (primary) hypertension: Secondary | ICD-10-CM

## 2014-02-22 DIAGNOSIS — F411 Generalized anxiety disorder: Secondary | ICD-10-CM

## 2014-02-22 NOTE — Patient Instructions (Signed)
Please consider miralax 1 capful daily with water or senna-S 2 tabs daily to keep your bowels regular.

## 2014-02-22 NOTE — Progress Notes (Signed)
Pre visit review using our clinic review tool, if applicable. No additional management support is needed unless otherwise documented below in the visit note. 

## 2014-02-22 NOTE — Progress Notes (Signed)
Subjective:    Patient ID: Dominique Hall, female    DOB: August 28, 1935, 79 y.o.   MRN: [id number]  HPI Here for physical Reviewed her wellness form  Recent visit to Dr Hulan Fray Urinary symptoms persist but are mild Not clear that macrodantin helped Questionable allergy to PCN --- but Enterococcus on culture (only 85K) No dysuria but has feeling while sitting Has been using the premarin cream  4 week bout with constipation Using Phillip's probiotic Discussed miralax or senna S to help  No falls No depression or anhedonia Reviewed advanced directives  Current Outpatient Prescriptions on File Prior to Visit  Medication Sig Dispense Refill  . clobetasol ointment (TEMOVATE) 0.05 % Apply to affected area every night for 4 weeks, then every other day for 4 weeks and then twice a week for 4 weeks or until resolution.  30 g  5  . clorazepate (TRANXENE) 7.5 MG tablet Take 1 tablet (7.5 mg total) by mouth at bedtime as needed for anxiety.  90 tablet  0  . esomeprazole (NEXIUM) 40 MG capsule Take 1 capsule (40 mg total) by mouth daily before breakfast.  90 capsule  3  . estradiol (ESTRACE) 0.1 MG/GM vaginal cream Insert 2 g daily intravaginally for 1 to 2 weeks, then  gradually reduce to 1/2 the initial dose for 1 to 2 weeks, followed by a maintenance dose of 1 g 1 to 3 times per week.  42.5 g  12  . hydrochlorothiazide (MICROZIDE) 12.5 MG capsule Take 1 capsule (12.5 mg total) by mouth every morning.  90 capsule  3  . nitrofurantoin, macrocrystal-monohydrate, (MACROBID) 100 MG capsule Take 1 capsule (100 mg total) by mouth 2 (two) times daily.  10 capsule  0  . Omega-3 Fatty Acids (FISH OIL PO) Take 1 capsule by mouth daily.      . ranitidine (ZANTAC) 150 MG tablet Take 1 tablet (150 mg total) by mouth at bedtime.  90 tablet  3  . vitamin C (ASCORBIC ACID) 500 MG tablet Take 500 mg by mouth daily.       No current facility-administered medications on file prior to visit.    Allergies    Allergen Reactions  . Penicillins     REACTION: unspecified  . Rabeprazole Sodium     REACTION: constipated  . Sulfonamide Derivatives     REACTION: unspecified  . Cefuroxime     diarrhea    Past Medical History  Diagnosis Date  . Allergy   . Arthritis   . Sleep disturbance   . Diverticulosis   . GERD (gastroesophageal reflux disease)   . Hiatal hernia   . HTN (hypertension)   . Osteopenia   . IBS (irritable bowel syndrome)   . Esophageal dysmotility   . Esophageal ring     Past Surgical History  Procedure Laterality Date  . Squamous cell carcinoma excision  6/12    Right leg--Dr Pat Patrick  . Lumpectomy  [phone]    benign right breast  . Appendectomy  1973  . Total abdominal hysterectomy w/ bilateral salpingoophorectomy  1973  . Kidney surgery  1970  . Esophagogastroduodenoscopy  07-2006    with dilation  . Cholecystectomy    . Abdominal hysterectomy    . Squamous cell carcinoma excision  2012    right calf    Family History  Problem Relation Age of Onset  . Stroke Mother   . Heart attack Father     History   Social History  .  Marital Status: Single    Spouse Name: N/A    Number of Children: 2  . Years of Education: N/A   Occupational History  . consignment shop    Social History Main Topics  . Smoking status: Former Research scientist (life sciences)  . Smokeless tobacco: Never Used  . Alcohol Use: Yes     Comment: occ  . Drug Use: No  . Sexual Activity: Not on file   Other Topics Concern  . Not on file   Social History Narrative   Has living will   Husband is [place] health care POA   Would accept resuscitation   Probably would not want tube feedings if cognitively unaware   Review of Systems  Constitutional: Negative for fatigue and unexpected weight change.       Wears seat belt  HENT: Positive for hearing loss. Negative for dental problem and tinnitus.        Regular with dentist  Eyes: Negative for visual disturbance.       No diplopia or unilateral vision loss   Respiratory: Negative for cough, chest tightness and shortness of breath.   Cardiovascular: Positive for palpitations. Negative for chest pain and leg swelling.       Mild brief palps for a few seconds  Gastrointestinal: Positive for constipation. Negative for nausea, vomiting, abdominal pain and blood in stool.       No heartburn  Endocrine: Positive for cold intolerance. Negative for heat intolerance.  Genitourinary: Positive for frequency.       See HPI  Musculoskeletal: Negative for arthralgias, back pain and myalgias.  Skin: Negative for rash.       No suspicious lesions  Allergic/Immunologic: Negative for environmental allergies and immunocompromised state.  Neurological: Negative for dizziness, syncope, weakness, light-headedness, numbness and headaches.  Hematological: Negative for adenopathy. Bruises/bleeds easily.  Psychiatric/Behavioral: Positive for sleep disturbance. Negative for dysphoric mood. The patient is not nervous/anxious.        Variable sleep--uses clorazepate occasionally       Objective:   Physical Exam  Constitutional: She is oriented to person, place, and time. She appears well-developed and well-nourished. No distress.  HENT:  Head: Normocephalic and atraumatic.  Right Ear: External ear normal.  Left Ear: External ear normal.  Mouth/Throat: Oropharynx is clear and moist. No oropharyngeal exudate.  Eyes: Conjunctivae and EOM are normal. Pupils are equal, round, and reactive to light.  Neck: Normal range of motion. Neck supple. No thyromegaly present.  Cardiovascular: Normal rate, regular rhythm, normal heart sounds and intact distal pulses.  Exam reveals no gallop.   No murmur heard. Pulmonary/Chest: Breath sounds normal. No respiratory distress. She has no wheezes. She has no rales.  Abdominal: Soft. There is no tenderness.  Genitourinary:  Asks me not to do breast exam  Musculoskeletal: She exhibits no edema and no tenderness.  Lymphadenopathy:     She has no cervical adenopathy.  Neurological: She is alert and oriented to person, place, and time.  Skin: No rash noted. No erythema.  Psychiatric: She has a normal mood and affect. Her behavior is normal.          Assessment & Plan:

## 2014-02-22 NOTE — Assessment & Plan Note (Signed)
Mainly just an issue at night Uses clorazepate prn

## 2014-02-22 NOTE — Assessment & Plan Note (Signed)
Generally doing well Prefers no prevnar or tetanus booster Just had mammo

## 2014-02-22 NOTE — Assessment & Plan Note (Signed)
Okay on the med

## 2014-02-22 NOTE — Assessment & Plan Note (Signed)
BP Readings from Last 3 Encounters:  02/22/14 118/70  02/11/14 136/69  01/29/14 128/68   Good control No changes Due for labs

## 2014-02-23 LAB — CBC WITH DIFFERENTIAL/PLATELET
BASOS ABS: 0 10*3/uL (ref 0.0–0.1)
BASOS PCT: 0.3 % (ref 0.0–3.0)
EOS ABS: 0.2 10*3/uL (ref 0.0–0.7)
Eosinophils Relative: 3.3 % (ref 0.0–5.0)
HCT: 36.6 % (ref 36.0–46.0)
Hemoglobin: 12.4 g/dL (ref 12.0–15.0)
Lymphocytes Relative: 20.2 % (ref 12.0–46.0)
Lymphs Abs: 1.2 10*3/uL (ref 0.7–4.0)
MCHC: 33.7 g/dL (ref 30.0–36.0)
MCV: 86.8 fl (ref 78.0–100.0)
Monocytes Absolute: 0.3 10*3/uL (ref 0.1–1.0)
Monocytes Relative: 5.6 % (ref 3.0–12.0)
NEUTROS PCT: 70.6 % (ref 43.0–77.0)
Neutro Abs: 4.1 10*3/uL (ref 1.4–7.7)
Platelets: 186 10*3/uL (ref 150.0–400.0)
RBC: 4.22 Mil/uL (ref 3.87–5.11)
RDW: 13.4 % (ref 11.5–14.6)
WBC: 5.8 10*3/uL (ref 4.5–10.5)

## 2014-02-23 LAB — COMPREHENSIVE METABOLIC PANEL
ALBUMIN: 4.1 g/dL (ref 3.5–5.2)
ALK PHOS: 53 U/L (ref 39–117)
ALT: 14 U/L (ref 0–35)
AST: 22 U/L (ref 0–37)
BUN: 20 mg/dL (ref 6–23)
CHLORIDE: 103 meq/L (ref 96–112)
CO2: 31 mEq/L (ref 19–32)
Calcium: 9.3 mg/dL (ref 8.4–10.5)
Creatinine, Ser: 0.6 mg/dL (ref 0.4–1.2)
GFR: 107.16 mL/min (ref >60.00)
Glucose, Bld: 85 mg/dL (ref 70–99)
POTASSIUM: 3.8 meq/L (ref 3.5–5.1)
SODIUM: 140 meq/L (ref 135–145)
TOTAL PROTEIN: 6.6 g/dL (ref 6.0–8.3)
Total Bilirubin: 0.6 mg/dL (ref 0.3–1.2)

## 2014-02-23 LAB — LIPID PANEL
CHOLESTEROL: 152 mg/dL (ref 0–200)
HDL: 63 mg/dL (ref >39.00)
LDL CALC: 69 mg/dL (ref 0–99)
Total CHOL/HDL Ratio: 2
Triglycerides: 98 mg/dL (ref 0.0–149.0)
VLDL: 19.6 mg/dL (ref 0.0–40.0)

## 2014-02-23 LAB — T4, FREE: Free T4: 0.88 ng/dL (ref 0.60–1.60)

## 2014-02-23 LAB — TSH: TSH: 0.35 u[IU]/mL (ref 0.35–5.50)

## 2014-02-25 ENCOUNTER — Encounter: Payer: Self-pay | Admitting: Obstetrics & Gynecology

## 2014-02-25 ENCOUNTER — Ambulatory Visit (INDEPENDENT_AMBULATORY_CARE_PROVIDER_SITE_OTHER): Payer: Medicare Other | Admitting: Obstetrics & Gynecology

## 2014-02-25 VITALS — BP 111/67 | HR 69 | Ht 65.0 in | Wt 137.2 lb

## 2014-02-25 DIAGNOSIS — N390 Urinary tract infection, site not specified: Secondary | ICD-10-CM

## 2014-02-25 LAB — POCT URINALYSIS DIPSTICK
Bilirubin, UA: NEGATIVE
Blood, UA: NEGATIVE
Glucose, UA: NEGATIVE
Ketones, UA: NEGATIVE
Nitrite, UA: NEGATIVE
PROTEIN UA: NEGATIVE
Spec Grav, UA: 1.01
UROBILINOGEN UA: NEGATIVE
pH, UA: 6

## 2014-02-25 MED ORDER — CEPHALEXIN 500 MG PO CAPS: 500.0000 mg | ORAL_CAPSULE | Freq: Four times a day (QID) | ORAL | Status: DC

## 2014-02-25 NOTE — Progress Notes (Signed)
Patient took 5 days of Macrobid and is still having symptoms of urinary tract infection.  She did feel better initially but feels like it has come back.  She feels as if something is going on in her vaginal area and would like to have this checked.  Her urine today is positive for moderate leukocytes but otherwise within normal limits.

## 2014-02-25 NOTE — Addendum Note (Signed)
Addended by: Erik Obey on: 02/25/2014 01:55 PM   Modules accepted: Orders

## 2014-02-25 NOTE — Progress Notes (Signed)
   Subjective:    Patient ID: Dominique Hall, female    DOB: [birth year], 79 y.o.   MRN: [id number]  HPI She is here with a recurrence of her UTI symptoms. A culture grew enterococcus. She took 5 days of Macrobid.  She also noted a bulging tissue on the anterior portion of her vagina, just past the introitus recently.   Review of Systems     Objective:   Physical Exam  Normal vulva, she has a bit of prominent tissue of the anterior ridge of her vaginal tissue near the introitus, certainly benign       Assessment & Plan:  UTI-amp TID Reassurance about her vaginal anatomy

## 2014-02-26 LAB — URINE CULTURE
Colony Count: NO GROWTH
Organism ID, Bacteria: NO GROWTH

## 2014-03-03 ENCOUNTER — Telehealth: Payer: Self-pay | Admitting: Internal Medicine

## 2014-03-03 NOTE — Telephone Encounter (Signed)
Left message with results on pt's VM, advised to call if any other questions

## 2014-03-03 NOTE — Telephone Encounter (Signed)
Have her stop the temovate as this can sometimes cause burning

## 2014-03-03 NOTE — Telephone Encounter (Signed)
Patient Information:  Caller Name: Dominique Hall  Phone: [phone]  Patient: Dominique Hall  Gender: Female  DOB: [birth year]  Age: 79 Years  PCP: [name] [facility])  Office Follow Up:  Does the office need to follow up with this patient?: No  Instructions For The Office: N/A  RN Note:  Is out of area in the [place] currently - will call to make Appt for recheck probably for tomorrow afternoon.  Symptoms  Reason For Call & Symptoms: Urine pain with vaginal burning for the last 6 weeks - seen in office and started Premarin 3/20. Saw Dr Hulan Fray OB.GYN, started temovate on 4/2.  Had blood and infection in urine so started Keflex on 4/16.  Vaginal burning has been worse for the last 7-10 days, denies itching.  Vaginal area felt like on fire last night 4/21, only 2 hours of sleep.  Still urinary frequency and urgency but only intermittently.  Reviewed Health History In EMR: Yes  Reviewed Medications In EMR: Yes  Reviewed Allergies In EMR: Yes  Reviewed Surgeries / Procedures: Yes  Date of Onset of Symptoms: Unknown  Guideline(s) Used:  Urination Pain - Female  Vaginal Discharge  Vulvar Symptoms  Disposition Per Guideline:   See Today or Tomorrow in Office  Reason For Disposition Reached:   Tender lump (swelling or "ball") at vaginal opening  Advice Given:  Genital Hygiene:  Keep your genital area clean. Wash daily.  Keep your genital area dry. Wear cotton underwear or underwear with a cotton crotch.  Do not douche.  Do not use feminine hygiene products.  Call Back If:  Fever occurs  Yellow or green vaginal discharge occurs  You become worse.  Patient Will Follow Care Advice:  YES

## 2014-03-05 ENCOUNTER — Ambulatory Visit (INDEPENDENT_AMBULATORY_CARE_PROVIDER_SITE_OTHER): Payer: Medicare Other | Admitting: Obstetrics & Gynecology

## 2014-03-05 ENCOUNTER — Encounter: Payer: Self-pay | Admitting: Obstetrics & Gynecology

## 2014-03-05 VITALS — BP 143/84 | HR 68 | Ht 65.0 in | Wt 138.0 lb

## 2014-03-05 DIAGNOSIS — N9489 Other specified conditions associated with female genital organs and menstrual cycle: Secondary | ICD-10-CM

## 2014-03-05 DIAGNOSIS — N949 Unspecified condition associated with female genital organs and menstrual cycle: Secondary | ICD-10-CM

## 2014-03-05 DIAGNOSIS — N909 Noninflammatory disorder of vulva and perineum, unspecified: Secondary | ICD-10-CM

## 2014-03-05 DIAGNOSIS — R300 Dysuria: Secondary | ICD-10-CM

## 2014-03-05 NOTE — Progress Notes (Signed)
   Subjective:    Patient ID: Dominique Hall, female    DOB: 27-Mar-1935, 79 y.o.   MRN: [id number]  HPI  She is here for a 6 week follow up after using vaginal estrogen and clobetasol Mon, Wed, Fridays. She notes a large improvement but is requesting a vulvar biopsy so that she can "know for sure what it is". She also reports some urinary frequency.  Review of Systems     Objective:   Physical Exam  Moderate improvement of vulvar appearance. There is still a small distinct white plaque on the right inner labia majora, near to the introitus  A consent was signed and a time out done. This area was prepped with betadine and infiltrated with 1 cc of 1% lidocaine.  A small 4 mm punch biopsy was used to get a specimen.  Silver nitrite was used to achieve silver nitrate.      Assessment & Plan:  Vulvar burning Await biopsy Continue current meds UC&S sent

## 2014-03-07 LAB — URINE CULTURE
Colony Count: NO GROWTH
Organism ID, Bacteria: NO GROWTH

## 2014-03-12 ENCOUNTER — Telehealth: Payer: Self-pay | Admitting: *Deleted

## 2014-03-12 NOTE — Telephone Encounter (Signed)
Patient called for biopsy test results.  She is reassured that there was no malignancy seen.

## 2014-03-21 ENCOUNTER — Encounter: Payer: Self-pay | Admitting: Obstetrics & Gynecology

## 2014-03-29 ENCOUNTER — Ambulatory Visit: Payer: Medicare Other | Admitting: Obstetrics & Gynecology

## 2014-04-20 ENCOUNTER — Other Ambulatory Visit: Payer: Self-pay | Admitting: Internal Medicine

## 2014-04-20 NOTE — Telephone Encounter (Signed)
01/14/14 

## 2014-04-20 NOTE — Telephone Encounter (Signed)
Okay #[phone] x [phone] 

## 2014-04-21 NOTE — Telephone Encounter (Signed)
rx called into pharmacy

## 2014-05-20 ENCOUNTER — Other Ambulatory Visit: Payer: Self-pay | Admitting: Internal Medicine

## 2014-06-28 ENCOUNTER — Ambulatory Visit (INDEPENDENT_AMBULATORY_CARE_PROVIDER_SITE_OTHER): Payer: Medicare Other | Admitting: Internal Medicine

## 2014-06-28 ENCOUNTER — Encounter: Payer: Self-pay | Admitting: Internal Medicine

## 2014-06-28 VITALS — BP 120/70 | HR 87 | Temp 98.1°F | Wt 138.0 lb

## 2014-06-28 DIAGNOSIS — L03119 Cellulitis of unspecified part of limb: Secondary | ICD-10-CM

## 2014-06-28 DIAGNOSIS — L03115 Cellulitis of right lower limb: Secondary | ICD-10-CM

## 2014-06-28 DIAGNOSIS — L02419 Cutaneous abscess of limb, unspecified: Secondary | ICD-10-CM

## 2014-06-28 MED ORDER — CEPHALEXIN 500 MG PO CAPS: 500.0000 mg | ORAL_CAPSULE | Freq: Three times a day (TID) | ORAL | Status: DC

## 2014-06-28 NOTE — Progress Notes (Signed)
Pre visit review using our clinic review tool, if applicable. No additional management support is needed unless otherwise documented below in the visit note. 

## 2014-06-28 NOTE — Assessment & Plan Note (Addendum)
Complicating traumatic injury of lower calf Will treat with keflex--has tolerated in the past (just diarrhea with ceftin) Topical Rx with antibiotic ointment

## 2014-06-28 NOTE — Progress Notes (Signed)
Subjective:    Patient ID: Dominique Hall, female    DOB: [birth year], 79 y.o.   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 clobetasol ointment (TEMOVATE) 0.05 % Apply to affected area every night for 4 weeks, then every other day for 4 weeks and then twice a week for 4 weeks or until resolution.  30 g  5  . clorazepate (TRANXENE) 7.5 MG tablet TAKE 1 TABLET BY MOUTH AT BEDTIME AS NEEDED FOR ANXIETY  90 tablet  0  . estradiol (ESTRACE) 0.1 MG/GM vaginal cream Insert 2 g daily intravaginally for 1 to 2 weeks, then  gradually reduce to 1/2 the initial dose for 1 to 2 weeks, followed by a maintenance dose of 1 g 1 to 3 times per week.  42.5 g  12  . hydrochlorothiazide (MICROZIDE) 12.5 MG capsule Take 1 capsule (12.5 mg total) by mouth every morning.  90 capsule  3  . NEXIUM 40 MG capsule TAKE 1 CAPSULE DAILY BEFOREBREAKFAST  90 capsule  0  . Omega-3 Fatty Acids (FISH OIL PO) Take 1 capsule by mouth daily.      . ranitidine (ZANTAC) 150 MG tablet Take 1 tablet (150 mg total) by mouth at bedtime.  90 tablet  3  . vitamin C (ASCORBIC ACID) 500 MG tablet Take 500 mg by mouth daily.       No current facility-administered medications on file prior to visit.    Allergies  Allergen Reactions  . Penicillins     REACTION: unspecified  . Rabeprazole Sodium     REACTION: constipated  . Sulfonamide Derivatives     REACTION: unspecified  . Cefuroxime     diarrhea    Past Medical History  Diagnosis Date  . Allergy   . Arthritis   . Sleep disturbance   . Diverticulosis   .  GERD (gastroesophageal reflux disease)   . Hiatal hernia   . HTN (hypertension)   . Osteopenia   . IBS (irritable bowel syndrome)   . Esophageal dysmotility   . Esophageal ring     Past Surgical History  Procedure Laterality Date  . Squamous cell carcinoma excision  6/12    Right leg--Dr Pat Patrick  . Lumpectomy  [phone]    benign right breast  . Appendectomy  1973  . Total abdominal hysterectomy w/ bilateral salpingoophorectomy  1973  . Kidney surgery  1970  . Esophagogastroduodenoscopy  07-2006    with dilation  . Cholecystectomy    . Abdominal hysterectomy    . Squamous cell carcinoma excision  2012    right calf    Family History  Problem Relation Age of Onset  . Stroke Mother   . Heart attack Father     History   Social History  . Marital Status: Single    Spouse Name: N/A    Number of Children: 2  . Years of Education: N/A   Occupational History  . consignment shop    Social History Main Topics  . Smoking  status: Former Research scientist (life sciences)  . Smokeless tobacco: Never Used  . Alcohol Use: Yes     Comment: occ  . Drug Use: No  . Sexual Activity: Not on file   Other Topics Concern  . Not on file   Social History Narrative   Has living will   Husband is [place] health care POA   Would accept resuscitation   Probably would not want tube feedings if cognitively unaware   Review of Systems No fever Not feeling sick    Objective:   Physical Exam  Constitutional: She appears well-developed and well-nourished. No distress.  Skin:  3-41mm ulcer with dark borders Surrounding redness and warmth Some tenderness          Assessment & Plan:

## 2014-07-05 ENCOUNTER — Other Ambulatory Visit: Payer: Self-pay | Admitting: Internal Medicine

## 2014-07-22 ENCOUNTER — Telehealth: Payer: Self-pay | Admitting: Internal Medicine

## 2014-07-22 ENCOUNTER — Encounter: Payer: Self-pay | Admitting: Internal Medicine

## 2014-07-22 ENCOUNTER — Ambulatory Visit (INDEPENDENT_AMBULATORY_CARE_PROVIDER_SITE_OTHER): Payer: Medicare Other | Admitting: Internal Medicine

## 2014-07-22 VITALS — BP 106/62 | HR 72 | Temp 97.2°F | Wt 136.0 lb

## 2014-07-22 DIAGNOSIS — R197 Diarrhea, unspecified: Secondary | ICD-10-CM

## 2014-07-22 DIAGNOSIS — R109 Unspecified abdominal pain: Secondary | ICD-10-CM

## 2014-07-22 NOTE — Telephone Encounter (Signed)
Seen

## 2014-07-22 NOTE — Progress Notes (Signed)
Subjective:    Patient ID: Dominique Hall, female    DOB: 04-17-1935, 79 y.o.   MRN: [id number]  HPI  Pt presents to the clinic today with c/o diarrhea and abdominal cramping. She reports this started 4 days ago. She is having > 10 loose watery stools per day. She notices the abdominal cramping just prior to the [facility] with relief of the pain after the BM. She is not sure if the stool has a foul odor because she has difficulty smelling. The stool in orange/yellow in color. She denies nausea, vomiting or blood in her stool. She has had symptoms like this in the past, when she had Cdiff before. She did just finish a course of keflex for cellulitis of the right leg. She also has a history of diverticulitis but reports this feels different. She has not had a recent change in her diet. She has not had contact with anyone with similar symptoms.  Review of Systems      Past Medical History  Diagnosis Date  . Allergy   . Arthritis   . Sleep disturbance   . Diverticulosis   . GERD (gastroesophageal reflux disease)   . Hiatal hernia   . HTN (hypertension)   . Osteopenia   . IBS (irritable bowel syndrome)   . Esophageal dysmotility   . Esophageal ring     Current Outpatient Prescriptions  Medication Sig Dispense Refill  . clobetasol ointment (TEMOVATE) 0.05 % Apply to affected area every night for 4 weeks, then every other day for 4 weeks and then twice a week for 4 weeks or until resolution.  30 g  5  . clorazepate (TRANXENE) 7.5 MG tablet TAKE 1 TABLET BY MOUTH AT BEDTIME AS NEEDED FOR ANXIETY  90 tablet  0  . estradiol (ESTRACE) 0.1 MG/GM vaginal cream Insert 2 g daily intravaginally for 1 to 2 weeks, then  gradually reduce to 1/2 the initial dose for 1 to 2 weeks, followed by a maintenance dose of 1 g 1 to 3 times per week.  42.5 g  12  . hydrochlorothiazide (MICROZIDE) 12.5 MG capsule TAKE 1 CAPSULE EVERY       MORNING  90 capsule  3  . NEXIUM 40 MG capsule TAKE 1 CAPSULE DAILY  BEFOREBREAKFAST  90 capsule  0  . Omega-3 Fatty Acids (FISH OIL PO) Take 1 capsule by mouth daily.      . ranitidine (ZANTAC) 150 MG tablet Take 1 tablet (150 mg total) by mouth at bedtime.  90 tablet  3  . vitamin C (ASCORBIC ACID) 500 MG tablet Take 500 mg by mouth daily.       No current facility-administered medications for this visit.    Allergies  Allergen Reactions  . Penicillins     REACTION: unspecified  . Rabeprazole Sodium     REACTION: constipated  . Sulfonamide Derivatives     REACTION: unspecified  . Cefuroxime     diarrhea    Family History  Problem Relation Age of Onset  . Stroke Mother   . Heart attack Father     History   Social History  . Marital Status: Married    Spouse Name: N/A    Number of Children: 2  . Years of Education: N/A   Occupational History  . consignment shop    Social History Main Topics  . Smoking status: Former Research scientist (life sciences)  . Smokeless tobacco: Never Used  . Alcohol Use: Yes     Comment:  occ  . Drug Use: No  . Sexual Activity: Not on file   Other Topics Concern  . Not on file   Social History Narrative   Has living will   Husband is [place] health care POA   Would accept resuscitation   Probably would not want tube feedings if cognitively unaware     Constitutional: Denies fever, malaise, fatigue, headache or abrupt weight changes.  Respiratory: Denies difficulty breathing, shortness of breath, cough or sputum production.   Cardiovascular: Denies chest pain, chest tightness, palpitations or swelling in the hands or feet.  Gastrointestinal: Pt reports abdominal cramping and diarrhea. Denies bloating, constipation, or blood in the stool.  GU: Denies urgency, frequency, pain with urination, burning sensation, blood in urine, odor or discharge.   No other specific complaints in a complete review of systems (except as listed in HPI above).  Objective:   Physical Exam   BP 106/62  Pulse 72  Temp(Src) 97.2 F (36.2 C) (Oral)   Wt 136 lb (61.689 kg)  SpO2 97% Wt Readings from Last 3 Encounters:  07/22/14 136 lb (61.689 kg)  06/28/14 138 lb (62.596 kg)  03/05/14 138 lb (62.596 kg)    General: Appears her stated age, well developed, well nourished in NAD. Cardiovascular: Normal rate and rhythm. S1,S2 noted.  No murmur, rubs or gallops noted.  Pulmonary/Chest: Normal effort and positive vesicular breath sounds. No respiratory distress. No wheezes, rales or ronchi noted.  Abdomen: Soft and slightly tender in the RLQ. Normal bowel sounds, no bruits noted. No distention or masses noted. Liver, spleen and kidneys non palpable.   BMET    Component Value Date/Time   NA 140 02/22/2014 1612   K 3.8 02/22/2014 1612   CL 103 02/22/2014 1612   CO2 31 02/22/2014 1612   GLUCOSE 85 02/22/2014 1612   BUN 20 02/22/2014 1612   CREATININE 0.6 02/22/2014 1612   CALCIUM 9.3 02/22/2014 1612   GFRNONAA 76.86 08/08/2010 1024    Lipid Panel     Component Value Date/Time   CHOL 152 02/22/2014 1612   TRIG 98.0 02/22/2014 1612   HDL 63.00 02/22/2014 1612   CHOLHDL 2 02/22/2014 1612   VLDL 19.6 02/22/2014 1612   LDLCALC 69 02/22/2014 1612    CBC    Component Value Date/Time   WBC 5.8 02/22/2014 1612   RBC 4.22 02/22/2014 1612   HGB 12.4 02/22/2014 1612   HCT 36.6 02/22/2014 1612   PLT 186.0 02/22/2014 1612   MCV 86.8 02/22/2014 1612   MCHC 33.7 02/22/2014 1612   RDW 13.4 02/22/2014 1612   LYMPHSABS 1.2 02/22/2014 1612   MONOABS 0.3 02/22/2014 1612   EOSABS 0.2 02/22/2014 1612   BASOSABS 0.0 02/22/2014 1612    Hgb A1C No results found for this basename: HGBA1C        Assessment & Plan:   Diarrhea:  Concern for Cdiff s/p antibiotic Will obtain Cdiff toxin and stool culture Push fluids Try not to take any antidiarrheals Make sure you are cleaning the bathroom with clorox and washing your hands well after having a BM If Cdiff positive- will start flagyl  Will follow up with you after labs come back

## 2014-07-22 NOTE — Progress Notes (Signed)
Pre visit review using our clinic review tool, if applicable. No additional management support is needed unless otherwise documented below in the visit note. 

## 2014-07-22 NOTE — Telephone Encounter (Signed)
Patient Information:  Caller Name: Phala  Phone: [phone]  Patient: Edmonia Lynch  Gender: Female  DOB: February 14, 1935  Age: 79 Years  PCP: [name] [facility])  Office Follow Up:  Does the office need to follow up with this patient?: No  Instructions For The Office: N/A  RN Note:  Appt scheduled in office today at 11:15 with Webb Silversmith NP.  Caller cannot schedule afternoon appt time. Care advice and call back parameters reviewed. Understanding expressed.  Symptoms  Reason For Call & Symptoms: Patient states she has Diarrhea . Onset Monday 07/19/14. Stools already x3 today. She reports yesterday "too numerous to count".  Color- brown . Liquid to loose consistancy. No blood noted. No N/V.  Afebrile. Cramping prior to BM.   She  Completed Keflex 500mg  tid x10 on 07/08/14. Patient is concerned that this is related to antibiotic therapy.  She took a left over antidiarrheal medication Diphen/Atrop  that helped and stopped Diarrhea for 7 hours. It started back again after that.  Reviewed Health History In EMR: Yes  Reviewed Medications In EMR: Yes  Reviewed Allergies In EMR: Yes  Reviewed Surgeries / Procedures: Yes  Date of Onset of Symptoms: 07/19/2014  Treatments Tried: Diphen/Atrop-  Left over medication for Diarrhea  Treatments Tried Worked: Yes  Guideline(s) Used:  Diarrhea  Disposition Per Guideline:   Go to Office Now  Reason For Disposition Reached:   Age > 60 years and has had > 6 diarrhea stools in past 24 hours  Advice Given:  Fluids:  Drink more fluids, at least 8-10 glasses (8 oz or 240 ml) daily.  For example: sports drinks, diluted fruit juices, soft drinks.  Supplement this with saltine crackers or soups to make certain that you are getting sufficient fluid and salt to meet your body's needs.  Avoid caffeinated beverages (Reason: caffeine is mildly dehydrating).  Nutrition:  Maintaining some food intake during episodes of diarrhea is  important.  Ideal initial foods include boiled starches/cereals (e.g., potatoes, rice, noodles, wheat, oats) with a small amount of salt to taste.  Other acceptable foods include: bananas, yogurt, crackers, soup.  As your stools return to normal consistency, resume a normal diet.  Call Back If:  You become worse.  Patient Will Follow Care Advice:  YES  Appointment Scheduled:  07/22/2014 11:15:00 Appointment Scheduled Provider:  Webb Silversmith

## 2014-07-22 NOTE — Patient Instructions (Addendum)
Clostridium Difficile Infection °Clostridium difficile (C. difficile) is a bacteria found in the intestinal tract or colon. Under certain conditions, it causes diarrhea and sometimes severe disease. The severe form of the disease is known as pseudomembranous colitis (often called C. difficile colitis). This disease can damage the lining of the colon or cause the colon to become enlarged (toxic megacolon). °CAUSES °Your colon normally contains many different bacteria, including C. difficile. The balance of bacteria in your colon can change during illness. This is especially true when you take antibiotic medicine. Taking antibiotics may allow the C. difficile to grow, multiply excessively, and make a toxin that then causes illness. The elderly and people with certain medical conditions have a greater risk of getting C. difficile infections. °SYMPTOMS °· Watery diarrhea. °· Fever. °· Fatigue. °· Loss of appetite. °· Nausea. °· Abdominal swelling, pain, or tenderness. °· Dehydration. °DIAGNOSIS °Your symptoms may make your caregiver suspect a C. difficile infection, especially if you have used antibiotics in the preceding weeks. However, there are only 2 ways to know for certain whether you have a C. difficile infection: °· A lab test that finds the toxin in your stool. °· The specific appearance of an abnormality (pseudomembrane) in your colon. This can only be seen by doing a sigmoidoscopy or colonoscopy. These procedures involve passing an instrument through your rectum to look at the inside of your colon. °Your caregiver will help determine if these tests are necessary. °TREATMENT °· Most people are successfully treated with one of two specific antibiotics, usually given by mouth. Other antibiotics you are receiving are stopped if possible. °· Intravenous (IV) fluids and correction of electrolyte imbalance may be necessary. °· Rarely, surgery may be needed to remove the infected part of the intestines. °· Careful  hand washing by you and your caregivers is important to prevent the spread of infection. In the hospital, your caregivers may also put on gowns and gloves to prevent the spread of the C. difficile bacteria. Your room is also cleaned regularly with a solution containing bleach or a product that is known to kill C. difficile. °HOME CARE INSTRUCTIONS °· Drink enough fluids to keep your urine clear or pale yellow. Avoid milk, caffeine, and alcohol. °· Ask your caregiver for specific rehydration instructions. °· Try eating small, frequent meals rather than large meals. °· Take your antibiotics as directed. Finish them even if you start to feel better. °· Do not use medicines to slow diarrhea. This could delay healing or cause complications. °· Wash your hands thoroughly after using the bathroom and before preparing food. °· Make sure people who live with you wash their hands often, too. °· Carefully disinfect all surfaces with a product that contains chlorine bleach. °SEEK MEDICAL CARE IF: °· Diarrhea persists longer than expected or recurs after completing your course of antibiotic treatment for the C. difficile infection. °· You have trouble staying hydrated. °SEEK IMMEDIATE MEDICAL CARE IF: °· You develop a new fever. °· You have increasing abdominal pain or tenderness. °· There is blood in your stools, or your stools are dark black and tarry. °· You cannot hold down food or liquids. °MAKE SURE YOU: °· Understand these instructions. °· Will watch your condition. °· Will get help right away if you are not doing well or get worse. °Document Released: 08/08/2005 Document Revised: 03/15/2014 Document Reviewed: 04/06/2011 °[employer]® Patient Information ©2015 [employer]. This information is not intended to replace advice given to you by your health care provider. Make sure you   discuss any questions you have with your health care provider. ° °

## 2014-07-23 ENCOUNTER — Telehealth: Payer: Self-pay

## 2014-07-23 LAB — C. DIFFICILE GDH AND TOXIN A/B
C. difficile GDH: DETECTED — AB
C. difficile Toxin A/B: NOT DETECTED

## 2014-07-23 LAB — CLOSTRIDIUM DIFFICILE BY PCR: CDIFFPCR: DETECTED — AB

## 2014-07-23 MED ORDER — METRONIDAZOLE 500 MG PO TABS: 500.0000 mg | ORAL_TABLET | Freq: Three times a day (TID) | ORAL | Status: DC

## 2014-07-23 NOTE — Telephone Encounter (Signed)
I sent you the result note with the dose already 500mg  tid for 10 days

## 2014-07-23 NOTE — Telephone Encounter (Signed)
Pt left v/m checking on status of flagyl rx;spoke with nick at [facility] and rx ready for pickup. Left v/m to notify pt rx ready for pick up.

## 2014-07-23 NOTE — Telephone Encounter (Signed)
Please tell her it is a VERY bad idea to take an antidiarrheal medication. This will prolong the infection in system and may make it worse! Have her start the flagyl and an probiotic. She can use pepto bismol if she wants Make sure she doesn't have any alcohol while on the metronidazole

## 2014-07-23 NOTE — Telephone Encounter (Signed)
Spoke with patient and advised results, what dose of flagyl should she take? Theres 250mg , 500mg  and 750mg  in epic, please advise

## 2014-07-23 NOTE — Telephone Encounter (Signed)
rx sent to pharmacy by e-script  

## 2014-07-23 NOTE — Telephone Encounter (Signed)
Pt was seen 07/22/14; last night pt took OTC anti diarrhea med and was able to sleep during the night but has had one very watery orange stool this morning; Pt states she feels so bad and weak she cannot wait for reply until this afternoon when Webb Silversmith NP will be in office. Pt wants to start Flagyl or do something to stop diarrhea ASAP. Pt request cb. [facility].

## 2014-07-26 LAB — STOOL CULTURE

## 2014-08-23 ENCOUNTER — Other Ambulatory Visit: Payer: Self-pay | Admitting: Internal Medicine

## 2014-08-23 NOTE — Telephone Encounter (Signed)
Electronic refill request, Dr. Silvio Pate out of office

## 2014-08-23 NOTE — Telephone Encounter (Signed)
Plz phone in

## 2014-08-23 NOTE — Telephone Encounter (Signed)
Rx called in as directed.   

## 2014-08-27 ENCOUNTER — Other Ambulatory Visit: Payer: Self-pay

## 2014-09-01 ENCOUNTER — Ambulatory Visit: Payer: Medicare Other

## 2014-09-07 ENCOUNTER — Telehealth: Payer: Self-pay | Admitting: Internal Medicine

## 2014-09-14 NOTE — Telephone Encounter (Signed)
Pt request refill nexium to [facility]; advise already refill on 09/08/14. Pt will ck with pharmacy.

## 2014-09-17 NOTE — Telephone Encounter (Signed)
Prescription was sent electronically, it is signed electronically, [facility] confirmed receipt, they received the rx!

## 2014-09-17 NOTE — Telephone Encounter (Signed)
Ronalee Belts with [facility] left v/m requesting cb [phone] ref # [id number]; not signed by physician.

## 2014-09-21 ENCOUNTER — Ambulatory Visit: Payer: Medicare Other

## 2014-09-21 ENCOUNTER — Ambulatory Visit (INDEPENDENT_AMBULATORY_CARE_PROVIDER_SITE_OTHER): Payer: Medicare Other | Admitting: *Deleted

## 2014-09-21 DIAGNOSIS — Z23 Encounter for immunization: Secondary | ICD-10-CM

## 2014-09-23 ENCOUNTER — Encounter: Payer: Self-pay | Admitting: Internal Medicine

## 2014-09-23 MED ORDER — ESOMEPRAZOLE MAGNESIUM 40 MG PO CPDR: 40.0000 mg | DELAYED_RELEASE_CAPSULE | Freq: Every day | ORAL | Status: DC

## 2014-09-23 NOTE — Addendum Note (Signed)
Addended by: Despina Hidden on: 09/23/2014 05:21 PM   Modules accepted: Orders, Medications

## 2014-10-18 ENCOUNTER — Encounter: Payer: Self-pay | Admitting: Internal Medicine

## 2014-10-18 ENCOUNTER — Other Ambulatory Visit: Payer: Self-pay

## 2014-10-18 DIAGNOSIS — Z1231 Encounter for screening mammogram for malignant neoplasm of breast: Secondary | ICD-10-CM

## 2014-11-19 DIAGNOSIS — H2513 Age-related nuclear cataract, bilateral: Secondary | ICD-10-CM | POA: Diagnosis not present

## 2014-12-21 ENCOUNTER — Encounter: Payer: Self-pay | Admitting: Internal Medicine

## 2014-12-21 NOTE — Telephone Encounter (Signed)
I assume this is the zantac It was done a year ago as Brand Name Only Okay to refill for another year in the same way

## 2014-12-22 MED ORDER — RANITIDINE HCL 150 MG PO TABS: 150.0000 mg | ORAL_TABLET | Freq: Every day | ORAL | Status: DC

## 2014-12-28 ENCOUNTER — Other Ambulatory Visit: Payer: Self-pay | Admitting: Family Medicine

## 2014-12-28 MED ORDER — CLORAZEPATE DIPOTASSIUM 7.5 MG PO TABS: 7.5000 mg | ORAL_TABLET | Freq: Every evening | ORAL | Status: DC | PRN

## 2014-12-28 NOTE — Telephone Encounter (Signed)
Rx called to pharmacy as instructed. Patient notified.

## 2014-12-28 NOTE — Telephone Encounter (Signed)
Approved: #90 x 0 1 tab at bedtime prn for insomnia

## 2015-01-20 ENCOUNTER — Ambulatory Visit
Admission: RE | Admit: 2015-01-20 | Discharge: 2015-01-20 | Disposition: A | Discharge status: Home / Self Care | Payer: Medicare Other | Source: Ambulatory Visit

## 2015-01-20 DIAGNOSIS — Z1231 Encounter for screening mammogram for malignant neoplasm of breast: Secondary | ICD-10-CM | POA: Diagnosis not present

## 2015-01-30 ENCOUNTER — Other Ambulatory Visit: Payer: Self-pay | Admitting: Family Medicine

## 2015-02-25 ENCOUNTER — Encounter: Payer: 59 | Admitting: Internal Medicine

## 2015-03-17 ENCOUNTER — Encounter: Payer: Self-pay | Admitting: Internal Medicine

## 2015-03-17 ENCOUNTER — Ambulatory Visit (INDEPENDENT_AMBULATORY_CARE_PROVIDER_SITE_OTHER): Payer: Medicare Other | Admitting: Internal Medicine

## 2015-03-17 VITALS — BP 120/70 | HR 72 | Temp 97.8°F | Ht 65.0 in | Wt 144.0 lb

## 2015-03-17 DIAGNOSIS — K219 Gastro-esophageal reflux disease without esophagitis: Secondary | ICD-10-CM

## 2015-03-17 DIAGNOSIS — F39 Unspecified mood [affective] disorder: Secondary | ICD-10-CM

## 2015-03-17 DIAGNOSIS — I10 Essential (primary) hypertension: Secondary | ICD-10-CM

## 2015-03-17 DIAGNOSIS — Z0000 Encounter for general adult medical examination without abnormal findings: Secondary | ICD-10-CM

## 2015-03-17 DIAGNOSIS — N952 Postmenopausal atrophic vaginitis: Secondary | ICD-10-CM | POA: Diagnosis not present

## 2015-03-17 DIAGNOSIS — Z7189 Other specified counseling: Secondary | ICD-10-CM | POA: Insufficient documentation

## 2015-03-17 DIAGNOSIS — K5901 Slow transit constipation: Secondary | ICD-10-CM

## 2015-03-17 NOTE — Assessment & Plan Note (Signed)
See social history 

## 2015-03-17 NOTE — Assessment & Plan Note (Signed)
Needs the PPI or her symptoms recur

## 2015-03-17 NOTE — Assessment & Plan Note (Signed)
BP Readings from Last 3 Encounters:  03/17/15 120/70  07/22/14 106/62  06/28/14 120/70   Good control No changes needed

## 2015-03-17 NOTE — Assessment & Plan Note (Signed)
Mild situational anxiety Uses the tranxene rarely

## 2015-03-17 NOTE — Assessment & Plan Note (Signed)
Doing well with low dose estrogen cream--has cut down considerably

## 2015-03-17 NOTE — Progress Notes (Signed)
Subjective:    Patient ID: Dominique Hall, female    DOB: 1934-12-17, 80 y.o.   MRN: [id number]  HPI Here for Medicare wellness and follow up of chronic medical conditions Reviewed form and advanced directives Dr Thomasene Ripple for eyes, Dr Nehemiah Massed for derm, Dr Norman Herrlich is dentist No procedures or hospital stays Vision is fine Has had some hearing loss--becoming a problem. (considering audiology eval) 1 glass of wine a day No tobacco Not really exercising-- but works part time at [employer]). Very active there No falls No depression or anhedonia No apparent cognitive problems  Continues on estrogen cream Uses 3 days per week Has cut down from daily--discussed trying further (doesn't use the full dose) No dysuria No intercourse  No chest pain No SOB No dizziness or syncope No edema No headaches  No nausea or vomiting Will get heartburn if she skips nexium doses Trouble swallowing vitamin C at times--nothing else  Takes the tranxene occasionally Used it at [employer], etc Sleeps well Tends to be claustrophobic-- some trouble in crowds  Current Outpatient Prescriptions on File Prior to Visit  Medication Sig Dispense Refill  . clorazepate (TRANXENE) 7.5 MG tablet Take 1 tablet (7.5 mg total) by mouth at bedtime as needed for anxiety. 90 tablet 0  . esomeprazole (NEXIUM) 40 MG capsule Take 1 capsule (40 mg total) by mouth daily. 90 capsule 3  . hydrochlorothiazide (MICROZIDE) 12.5 MG capsule TAKE 1 CAPSULE EVERY       MORNING 90 capsule 3  . Omega-3 Fatty Acids (FISH OIL PO) Take 1 capsule by mouth daily.    . ranitidine (ZANTAC) 150 MG tablet Take 1 tablet (150 mg total) by mouth at bedtime. 90 tablet 3   No current facility-administered medications on file prior to visit.    Allergies  Allergen Reactions  . Penicillins     REACTION: unspecified  . Rabeprazole Sodium     REACTION: constipated  . Sulfonamide Derivatives     REACTION:  unspecified  . Cefuroxime     diarrhea    Past Medical History  Diagnosis Date  . Allergy   . Arthritis   . Sleep disturbance   . Diverticulosis   . GERD (gastroesophageal reflux disease)   . Hiatal hernia   . HTN (hypertension)   . Osteopenia   . IBS (irritable bowel syndrome)   . Esophageal dysmotility   . Esophageal ring     Past Surgical History  Procedure Laterality Date  . Squamous cell carcinoma excision  6/12    Right leg--Dr Pat Patrick  . Lumpectomy  [phone]    benign right breast  . Appendectomy  1973  . Total abdominal hysterectomy w/ bilateral salpingoophorectomy  1973  . Kidney surgery  1970  . Esophagogastroduodenoscopy  07-2006    with dilation  . Cholecystectomy    . Abdominal hysterectomy    . Squamous cell carcinoma excision  2012    right calf    Family History  Problem Relation Age of Onset  . Stroke Mother   . Heart attack Father     History   Social History  . Marital Status: Married    Spouse Name: N/A  . Number of Children: 2  . Years of Education: N/A   Occupational History  . consignment shop    Social History Main Topics  . Smoking status: Former Research scientist (life sciences)  . Smokeless tobacco: Never Used  . Alcohol Use: Yes     Comment: occ  .  Drug Use: No  . Sexual Activity: Not on file   Other Topics Concern  . Not on file   Social History Narrative   Has living will   Husband is [place] health care POA   Would accept resuscitation but no prolonged ventilation   Probably would not want tube feedings if cognitively unaware   Review of Systems Bowels still slow-- uses fiber pill for that. Usually goes every 2-3 days. Felt she had to go every day--discussed Discussed trying miralax if she wants to be more regular Voids fine--no incontinence No back pain or joint pain Still with plantar fasciitis--has orthotics Teeth are fine--regular with dentist    Objective:   Physical Exam  Constitutional: She is oriented to person, place, and time. She  appears well-developed and well-nourished. No distress.  HENT:  Mouth/Throat: Oropharynx is clear and moist. No oropharyngeal exudate.  Neck: Normal range of motion. Neck supple. No thyromegaly present.  Cardiovascular: Normal rate, regular rhythm, normal heart sounds and intact distal pulses.  Exam reveals no gallop.   No murmur heard. Pulmonary/Chest: Effort normal and breath sounds normal. No respiratory distress. She has no wheezes. She has no rales.  Abdominal: Soft. There is no tenderness.  Musculoskeletal: She exhibits no edema or tenderness.  Lymphadenopathy:    She has no cervical adenopathy.  Neurological: She is alert and oriented to person, place, and time.  President-- "Obama, Bush, Clinton" [phone] [id number] Recall 3/3  Skin: No rash noted. No erythema.  Psychiatric: She has a normal mood and affect. Her behavior is normal.          Assessment & Plan:

## 2015-03-17 NOTE — Assessment & Plan Note (Signed)
I have personally reviewed the Medicare Annual Wellness questionnaire and have noted 1. The patient's medical and social history 2. Their use of alcohol, tobacco or illicit drugs 3. Their current medications and supplements 4. The patient's functional ability including ADL's, fall risks, home safety risks and hearing or visual             impairment. 5. Diet and physical activities 6. Evidence for depression or mood disorders  The patients weight, height, BMI and visual acuity have been recorded in the chart I have made referrals, counseling and provided education to the patient based review of the above and I have provided the pt with a written personalized care plan for preventive services.  I have provided you with a copy of your personalized plan for preventive services. Please take the time to review along with your updated medication list.  UTD on colonoscopy Recommended every 2 year mammograms till 89 Still doesn't want prevnar or tetanus booster Discussed fitness Does take yearly flu shot

## 2015-03-17 NOTE — Assessment & Plan Note (Signed)
Discussed the option of miralax instead of the fiber

## 2015-03-17 NOTE — Progress Notes (Signed)
Pre visit review using our clinic review tool, if applicable. No additional management support is needed unless otherwise documented below in the visit note. 

## 2015-03-18 LAB — CBC WITH DIFFERENTIAL/PLATELET
BASOS ABS: 0 10*3/uL (ref 0.0–0.1)
Basophils Relative: 0.3 % (ref 0.0–3.0)
EOS ABS: 0.3 10*3/uL (ref 0.0–0.7)
Eosinophils Relative: 5.1 % — ABNORMAL HIGH (ref 0.0–5.0)
HCT: 37 % (ref 36.0–46.0)
HEMOGLOBIN: 12.5 g/dL (ref 12.0–15.0)
LYMPHS PCT: 19.8 % (ref 12.0–46.0)
Lymphs Abs: 1.2 10*3/uL (ref 0.7–4.0)
MCHC: 33.9 g/dL (ref 30.0–36.0)
MCV: 84.7 fl (ref 78.0–100.0)
MONO ABS: 0.3 10*3/uL (ref 0.1–1.0)
MONOS PCT: 5.1 % (ref 3.0–12.0)
Neutro Abs: 4.1 10*3/uL (ref 1.4–7.7)
Neutrophils Relative %: 69.7 % (ref 43.0–77.0)
Platelets: 188 10*3/uL (ref 150.0–400.0)
RBC: 4.37 Mil/uL (ref 3.87–5.11)
RDW: 12.9 % (ref 11.5–15.5)
WBC: 5.9 10*3/uL (ref 4.0–10.5)

## 2015-03-18 LAB — COMPREHENSIVE METABOLIC PANEL
ALBUMIN: 4 g/dL (ref 3.5–5.2)
ALT: 14 U/L (ref 0–35)
AST: 18 U/L (ref 0–37)
Alkaline Phosphatase: 65 U/L (ref 39–117)
BUN: 22 mg/dL (ref 6–23)
CO2: 30 mEq/L (ref 19–32)
Calcium: 9.6 mg/dL (ref 8.4–10.5)
Chloride: 105 mEq/L (ref 96–112)
Creatinine, Ser: 0.76 mg/dL (ref 0.40–1.20)
GFR: 78.23 mL/min (ref >60.00)
Glucose, Bld: 103 mg/dL — ABNORMAL HIGH (ref 70–99)
POTASSIUM: 3.7 meq/L (ref 3.5–5.1)
SODIUM: 141 meq/L (ref 135–145)
Total Bilirubin: 0.5 mg/dL (ref 0.2–1.2)
Total Protein: 6.6 g/dL (ref 6.0–8.3)

## 2015-03-18 LAB — T4, FREE: Free T4: 0.88 ng/dL (ref 0.60–1.60)

## 2015-05-09 ENCOUNTER — Other Ambulatory Visit: Payer: Self-pay

## 2015-05-19 ENCOUNTER — Telehealth: Payer: Self-pay | Admitting: Internal Medicine

## 2015-05-19 DIAGNOSIS — L989 Disorder of the skin and subcutaneous tissue, unspecified: Secondary | ICD-10-CM

## 2015-05-19 NOTE — Telephone Encounter (Signed)
Referral made 

## 2015-05-19 NOTE — Telephone Encounter (Signed)
Spouse called stating that dr Drema Dallas @ [facility] told ms Kunda that she needs a referral for insurance to pay They will not make an appointment  until she gets a referral  She going to have a spot on her face removed  Pt has [employer]) and [employer]

## 2015-05-24 ENCOUNTER — Other Ambulatory Visit: Payer: Self-pay | Admitting: Internal Medicine

## 2015-05-24 NOTE — Telephone Encounter (Signed)
05/24/15-Called pt home phone; rang busy. LVM on cell phone to call back -arc

## 2015-05-27 ENCOUNTER — Other Ambulatory Visit: Payer: Self-pay | Admitting: Internal Medicine

## 2015-05-27 NOTE — Telephone Encounter (Signed)
Last prescribed on 12/28/14

## 2015-05-27 NOTE — Telephone Encounter (Signed)
Approved: okay #[id number] x 0 

## 2015-05-27 NOTE — Telephone Encounter (Signed)
Rx called in to [facility]. 

## 2015-06-27 ENCOUNTER — Encounter: Payer: Self-pay | Admitting: Internal Medicine

## 2015-06-27 MED ORDER — ESTRADIOL 0.1 MG/GM VA CREA: 1.0000 | TOPICAL_CREAM | VAGINAL | Status: DC

## 2015-06-27 NOTE — Telephone Encounter (Signed)
Ok to fill 

## 2015-07-14 DIAGNOSIS — L821 Other seborrheic keratosis: Secondary | ICD-10-CM | POA: Diagnosis not present

## 2015-07-14 DIAGNOSIS — D485 Neoplasm of uncertain behavior of skin: Secondary | ICD-10-CM | POA: Diagnosis not present

## 2015-07-14 DIAGNOSIS — L814 Other melanin hyperpigmentation: Secondary | ICD-10-CM | POA: Diagnosis not present

## 2015-07-14 DIAGNOSIS — C44319 Basal cell carcinoma of skin of other parts of face: Secondary | ICD-10-CM | POA: Diagnosis not present

## 2015-08-01 DIAGNOSIS — C44319 Basal cell carcinoma of skin of other parts of face: Secondary | ICD-10-CM | POA: Diagnosis not present

## 2015-08-14 ENCOUNTER — Emergency Department: Payer: Medicare Other

## 2015-08-14 ENCOUNTER — Emergency Department
Admission: EM | Admit: 2015-08-14 | Discharge: 2015-08-14 | Disposition: A | Discharge status: Home / Self Care | Payer: Medicare Other | Attending: Emergency Medicine | Admitting: Emergency Medicine

## 2015-08-14 ENCOUNTER — Encounter: Payer: Self-pay | Admitting: Emergency Medicine

## 2015-08-14 DIAGNOSIS — Z87891 Personal history of nicotine dependence: Secondary | ICD-10-CM | POA: Insufficient documentation

## 2015-08-14 DIAGNOSIS — N201 Calculus of ureter: Secondary | ICD-10-CM | POA: Diagnosis not present

## 2015-08-14 DIAGNOSIS — N139 Obstructive and reflux uropathy, unspecified: Secondary | ICD-10-CM | POA: Diagnosis not present

## 2015-08-14 DIAGNOSIS — I10 Essential (primary) hypertension: Secondary | ICD-10-CM | POA: Diagnosis not present

## 2015-08-14 DIAGNOSIS — Z880 Allergy status to penicillin: Secondary | ICD-10-CM | POA: Insufficient documentation

## 2015-08-14 DIAGNOSIS — Z79899 Other long term (current) drug therapy: Secondary | ICD-10-CM | POA: Insufficient documentation

## 2015-08-14 DIAGNOSIS — R109 Unspecified abdominal pain: Secondary | ICD-10-CM | POA: Diagnosis present

## 2015-08-14 LAB — BASIC METABOLIC PANEL
Anion gap: 3 — ABNORMAL LOW (ref 5–15)
BUN: 24 mg/dL — ABNORMAL HIGH (ref 6–20)
CHLORIDE: 106 mmol/L (ref 101–111)
CO2: 28 mmol/L (ref 22–32)
CREATININE: 1.04 mg/dL — AB (ref 0.44–1.00)
Calcium: 9.4 mg/dL (ref 8.9–10.3)
GFR calc Af Amer: 58 mL/min — ABNORMAL LOW (ref >60)
GFR calc non Af Amer: 50 mL/min — ABNORMAL LOW (ref >60)
Glucose, Bld: 103 mg/dL — ABNORMAL HIGH (ref 65–99)
Potassium: 3.8 mmol/L (ref 3.5–5.1)
Sodium: 137 mmol/L (ref 135–145)

## 2015-08-14 LAB — CBC
HEMATOCRIT: 37.6 % (ref 35.0–47.0)
Hemoglobin: 13 g/dL (ref 12.0–16.0)
MCH: 29 pg (ref 26.0–34.0)
MCHC: 34.6 g/dL (ref 32.0–36.0)
MCV: 83.9 fL (ref 80.0–100.0)
PLATELETS: 168 10*3/uL (ref 150–440)
RBC: 4.48 MIL/uL (ref 3.80–5.20)
RDW: 12.4 % (ref 11.5–14.5)
WBC: 7.7 10*3/uL (ref 3.6–11.0)

## 2015-08-14 LAB — URINALYSIS COMPLETE WITH MICROSCOPIC (ARMC ONLY)
BILIRUBIN URINE: NEGATIVE
Bacteria, UA: NONE SEEN
Glucose, UA: NEGATIVE mg/dL
KETONES UR: NEGATIVE mg/dL
NITRITE: NEGATIVE
Protein, ur: 30 mg/dL — AB
SPECIFIC GRAVITY, URINE: 1.021 (ref 1.005–1.030)
Trans Epithel, UA: 1
pH: 5 (ref 5.0–8.0)

## 2015-08-14 MED ORDER — TRAMADOL HCL 50 MG PO TABS: 50.0000 mg | ORAL_TABLET | Freq: Four times a day (QID) | ORAL | Status: DC | PRN

## 2015-08-14 MED ORDER — TAMSULOSIN HCL 0.4 MG PO CAPS: 0.4000 mg | ORAL_CAPSULE | Freq: Every day | ORAL | Status: DC

## 2015-08-14 MED ORDER — ONDANSETRON 4 MG PO TBDP
4.0000 mg | ORAL_TABLET | Freq: Once | ORAL | Status: AC
  Administered 2015-08-14: 4 mg via ORAL
  Filled 2015-08-14: qty 1

## 2015-08-14 MED ORDER — ONDANSETRON 4 MG PO TBDP: 4.0000 mg | ORAL_TABLET | Freq: Three times a day (TID) | ORAL | Status: DC | PRN

## 2015-08-14 MED ORDER — CIPROFLOXACIN HCL 500 MG PO TABS: 500.0000 mg | ORAL_TABLET | Freq: Two times a day (BID) | ORAL | Status: AC

## 2015-08-14 MED ORDER — TRAMADOL HCL 50 MG PO TABS
100.0000 mg | ORAL_TABLET | Freq: Once | ORAL | Status: AC
  Administered 2015-08-14: 100 mg via ORAL
  Filled 2015-08-14: qty 2

## 2015-08-14 NOTE — ED Notes (Signed)
Discharge instructions, medications, and follow-up care reviewed with patient. No questions or concerns at this time. Pt stable at discharge. 

## 2015-08-14 NOTE — Discharge Instructions (Signed)
Please call the number provided for urology tomorrow morning to arrange a follow-up appointment as soon as possible. Return to the emergency department for any increased abdominal pain, fever, vomiting, or any other symptom personally concerning to your self.   Kidney Stones Kidney stones (urolithiasis) are solid masses that form inside your kidneys. The intense pain is caused by the stone moving through the kidney, ureter, bladder, and urethra (urinary tract). When the stone moves, the ureter starts to spasm around the stone. The stone is usually passed in your pee (urine).  HOME CARE  Drink enough fluids to keep your pee clear or pale yellow. This helps to get the stone out.  Strain all pee through the provided strainer. Do not pee without peeing through the strainer, not even once. If you pee the stone out, catch it in the strainer. The stone may be as small as a grain of salt. Take this to your doctor. This will help your doctor figure out what you can do to try to prevent more kidney stones.  Only take medicine as told by your doctor.  Follow up with your doctor as told.  Get follow-up X-rays as told by your doctor. GET HELP IF: You have pain that gets worse even if you have been taking pain medicine. GET HELP RIGHT AWAY IF:   Your pain does not get better with medicine.  You have a fever or shaking chills.  Your pain increases and gets worse over 18 hours.  You have new belly (abdominal) pain.  You feel faint or pass out.  You are unable to pee. MAKE SURE YOU:   Understand these instructions.  Will watch your condition.  Will get help right away if you are not doing well or get worse. Document Released: 04/16/2008 Document Revised: 07/01/2013 Document Reviewed: 04/01/2013 [facility] Patient Information 2015 [place], [place]. This information is not intended to replace advice given to you by your health care provider. Make sure you discuss any questions you have with your  health care provider.

## 2015-08-14 NOTE — ED Provider Notes (Signed)
[facility] Emergency Department Provider Note  Time seen: 2:47 PM  I have reviewed the triage vital signs and the nursing notes.   HISTORY  Chief Complaint Abdominal Pain and Flank Pain    HPI Dominique Hall is a 80 y.o. female with a past medical history of arthritis , hypertension, IBS, kidney stones presents the emergency department left flank pain. According to the patient she developed left flank pain yesterday, it has progressively worsened so the patient came to the emergency department today. Denies any dysuria, hematuria, nausea, vomiting, diarrhea. Patient states she is approximately 10 kidney stones in the past but has not required any procedures for removal. Denies fever. Describes her pain as moderate, 6-7/10. Dull/aching pain.    Past Medical History  Diagnosis Date  . Allergy   . Arthritis   . Sleep disturbance   . Diverticulosis   . GERD (gastroesophageal reflux disease)   . Hiatal hernia   . HTN (hypertension)   . Osteopenia   . IBS (irritable bowel syndrome)   . Esophageal dysmotility   . Esophageal ring     Patient Active Problem List   Diagnosis Date Noted  . Advance directive discussed with patient 03/17/2015  . Constipation 02/03/2014  . Atrophic vaginitis 01/29/2014  . Cystocele, grade 1-2 01/29/2014  . Actinic keratosis 02/17/2013  . Fibrocystic breast changes 12/05/2012  . Routine general medical examination at a health care facility 08/14/2011  . IRRITABLE BOWEL SYNDROME 06/08/2008  . Mild mood disorder ([place]) 04/15/2007  . Essential hypertension, benign 04/15/2007  . ALLERGIC RHINITIS 04/15/2007  . GERD 04/15/2007  . DIVERTICULOSIS, COLON 04/15/2007  . OSTEOPENIA 04/15/2007    Past Surgical History  Procedure Laterality Date  . Squamous cell carcinoma excision  6/12    Right leg--Dr Pat Patrick  . Lumpectomy  [phone]    benign right breast  . Appendectomy  1973  . Total abdominal hysterectomy w/ bilateral  salpingoophorectomy  1973  . Kidney surgery  1970  . Esophagogastroduodenoscopy  07-2006    with dilation  . Cholecystectomy    . Abdominal hysterectomy    . Squamous cell carcinoma excision  2012    right calf    Current Outpatient Rx  Name  Route  Sig  Dispense  Refill  . clorazepate (TRANXENE) 7.5 MG tablet      TAKE 1 TABLET BY MOUTH AT BEDTIME   90 tablet   0     Not to exceed 5 additional fills before 06/26/2015 ...   . esomeprazole (NEXIUM) 40 MG capsule   Oral   Take 1 capsule (40 mg total) by mouth daily.   90 capsule   3   . estradiol (ESTRACE) 0.1 MG/GM vaginal cream   Vaginal   Place 1 Applicatorful vaginally 3 (three) times a week.   42.5 g   11   . hydrochlorothiazide (MICROZIDE) 12.5 MG capsule      TAKE 1 CAPSULE EVERY       MORNING   90 capsule   3   . Methylcellulose, Laxative, 500 MG TABS   Oral   Take by mouth daily.         . Multiple Vitamin (MULTIVITAMIN) tablet   Oral   Take 1 tablet by mouth daily.         . Omega-3 Fatty Acids (FISH OIL PO)   Oral   Take 1 capsule by mouth daily.         . Probiotic Product (PHILLIPS  [employer]ON HEALTH) CAPS   Oral   Take by mouth daily.         . ranitidine (ZANTAC) 150 MG tablet   Oral   Take 1 tablet (150 mg total) by mouth at bedtime.   90 tablet   3     Dispense as written 1 (brand name only)     Allergies Penicillins; Rabeprazole sodium; Sulfonamide derivatives; and Cefuroxime  Family History  Problem Relation Age of Onset  . Stroke Mother   . Heart attack Father     Social History Social History  Substance Use Topics  . Smoking status: Former Research scientist (life sciences)  . Smokeless tobacco: Never Used  . Alcohol Use: Yes     Comment: occ    Review of Systems Constitutional: Negative for fever Cardiovascular: Negative for chest pain. Respiratory: Negative for shortness of breath. Gastrointestinal:  Positive left flank pain. Genitourinary: Negative for dysuria. negative  hematuria. Neurological: Negative for headache 10-point ROS otherwise negative.  ____________________________________________   PHYSICAL EXAM:  VITAL SIGNS: ED Triage Vitals  Enc Vitals Group     BP 08/14/15 0828 152/66 mmHg     Pulse Rate 08/14/15 0828 68     Resp 08/14/15 0828 18     Temp 08/14/15 0828 97.5 F (36.4 C)     Temp Source 08/14/15 0828 Oral     SpO2 08/14/15 0828 99 %     Weight 08/14/15 0828 143 lb (64.864 kg)     Height 08/14/15 0828 5\' 5"  (1.651 m)     Head Cir --      Peak Flow --      Pain Score 08/14/15 0828 6     Pain Loc --      Pain Edu? --      Excl. in [place]? --     Constitutional: Alert and oriented. Well appearing and in no distress. Eyes: Normal exam ENT   Head: Normocephalic and atraumatic.   Mouth/Throat: Mucous membranes are moist. Cardiovascular: Normal rate, regular rhythm. No murmur Respiratory: Normal respiratory effort without tachypnea nor retractions. Breath sounds are clear Gastrointestinal:  Soft, mild left tenderness palpation. No rebound or guarding. No CVA tenderness. Musculoskeletal: Nontender with normal range of motion in all extremities.  Neurologic:  Normal speech and language. No gross focal neurologic deficits  Psychiatric: Mood and affect are normal. Speech and behavior are normal.  ____________________________________________    RADIOLOGY  CT consistent with left-sided ureteral stone. With perinephric edema suggesting forniceal rupture.  ____________________________________________    INITIAL IMPRESSION / ASSESSMENT AND PLAN / ED COURSE  Pertinent labs & imaging results that were available during my care of the patient were reviewed by me and considered in my medical decision making (see chart for details).  Patient presents with left flank pain, CT consistent with ureterolithiasis with forniceal rupture. I discussed the CT findings impatient with the on-call urologist. As the patient is afebrile, not  vomiting and pain is controlled he believes the patient is safe to wait for outpatient management. I discussed this with the patient who is agreeable. I discussed strict return precautions for worsening pain, vomiting, fever. Patient agreeable. Patient also with a 5 mm lower lobe nodule, and discussed the patient needs follow-up with a PCP for repeat CT in 6-12 months. Patient is agreeable. I have also printed out a report of the CT scan for the patient to bring to her primary care doctor.  ____________________________________________   FINAL CLINICAL IMPRESSION(S) / ED DIAGNOSES  ureterolithiasis  Harvest Dark, MD 08/14/15 1453

## 2015-08-14 NOTE — ED Notes (Signed)
Pt here with left flank pain and left lower quad pain that began through the night. Pt has a significant hx of kidney stones. Denies burning with urination. Appears in no distress at this time.

## 2015-08-15 ENCOUNTER — Ambulatory Visit (INDEPENDENT_AMBULATORY_CARE_PROVIDER_SITE_OTHER): Payer: Medicare Other | Admitting: Internal Medicine

## 2015-08-15 ENCOUNTER — Encounter: Payer: Self-pay | Admitting: Internal Medicine

## 2015-08-15 ENCOUNTER — Telehealth: Payer: Self-pay

## 2015-08-15 VITALS — BP 128/80 | HR 57 | Temp 97.9°F | Wt 144.0 lb

## 2015-08-15 DIAGNOSIS — N201 Calculus of ureter: Secondary | ICD-10-CM

## 2015-08-15 DIAGNOSIS — N200 Calculus of kidney: Secondary | ICD-10-CM

## 2015-08-15 MED ORDER — PROMETHAZINE HCL 12.5 MG PO TABS: 12.5000 mg | ORAL_TABLET | Freq: Four times a day (QID) | ORAL | Status: DC | PRN

## 2015-08-15 NOTE — Assessment & Plan Note (Signed)
May have passed stone since she had severe pain last night and then it got better Still feels terrible Will give phenergan for nausea Try to avoid the tramadol--may be making her feel funny Has appt at [facility] on Wednesday Increase fluids and start the tamsulosin

## 2015-08-15 NOTE — Progress Notes (Signed)
Pre visit review using our clinic review tool, if applicable. No additional management support is needed unless otherwise documented below in the visit note. 

## 2015-08-15 NOTE — Progress Notes (Signed)
Subjective:    Patient ID: Dominique Hall, female    DOB: December 13, 1934, 80 y.o.   MRN: [id number]  HPI Here with husband for ER follow up Sent here despite the fact that she is now set up with urologist on Wednesday ([facility])  Reviewed ER records Thinks she may have passed stone early this AM---her pain is not as bad Very nauseated still--not able to eat or drink Did get ondansetron--couldn't tolerate due to headache  Feels lousy overall-- "worse than lousy" "I'm like in another world--foggy"  Has tried the tramadol--last night then 1/2 later (~10PM)  Current Outpatient Prescriptions on File Prior to Visit  Medication Sig Dispense Refill  . ciprofloxacin (CIPRO) 500 MG tablet Take 1 tablet (500 mg total) by mouth 2 (two) times daily. 20 tablet 0  . clorazepate (TRANXENE) 7.5 MG tablet TAKE 1 TABLET BY MOUTH AT BEDTIME 90 tablet 0  . esomeprazole (NEXIUM) 40 MG capsule Take 1 capsule (40 mg total) by mouth daily. 90 capsule 3  . estradiol (ESTRACE) 0.1 MG/GM vaginal cream Place 1 Applicatorful vaginally 3 (three) times a week. 42.5 g 11  . hydrochlorothiazide (MICROZIDE) 12.5 MG capsule TAKE 1 CAPSULE EVERY       MORNING 90 capsule 3  . Methylcellulose, Laxative, 500 MG TABS Take by mouth daily.    . Multiple Vitamin (MULTIVITAMIN) tablet Take 1 tablet by mouth daily.    . Omega-3 Fatty Acids (FISH OIL PO) Take 1 capsule by mouth daily.    . ondansetron (ZOFRAN ODT) 4 MG disintegrating tablet Take 1 tablet (4 mg total) by mouth every 8 (eight) hours as needed for nausea or vomiting. 20 tablet 0  . Probiotic Product ([place]) CAPS Take by mouth daily.    . ranitidine (ZANTAC) 150 MG tablet Take 1 tablet (150 mg total) by mouth at bedtime. 90 tablet 3  . tamsulosin (FLOMAX) 0.4 MG CAPS capsule Take 1 capsule (0.4 mg total) by mouth daily. 30 capsule 0  . traMADol (ULTRAM) 50 MG tablet Take 1 tablet (50 mg total) by mouth every 6 (six) hours as needed. 20 tablet 0   No  current facility-administered medications on file prior to visit.    Allergies  Allergen Reactions  . Penicillins     REACTION: unspecified  . Rabeprazole Sodium     REACTION: constipated  . Sulfonamide Derivatives     REACTION: unspecified  . Cefuroxime     diarrhea    Past Medical History  Diagnosis Date  . Allergy   . Arthritis   . Sleep disturbance   . Diverticulosis   . GERD (gastroesophageal reflux disease)   . Hiatal hernia   . HTN (hypertension)   . Osteopenia   . IBS (irritable bowel syndrome)   . Esophageal dysmotility   . Esophageal ring     Past Surgical History  Procedure Laterality Date  . Squamous cell carcinoma excision  6/12    Right leg--Dr Pat Patrick  . Lumpectomy  [phone]    benign right breast  . Appendectomy  1973  . Total abdominal hysterectomy w/ bilateral salpingoophorectomy  1973  . Kidney surgery  1970  . Esophagogastroduodenoscopy  07-2006    with dilation  . Cholecystectomy    . Abdominal hysterectomy    . Squamous cell carcinoma excision  2012    right calf    Family History  Problem Relation Age of Onset  . Stroke Mother   . Heart attack Father  Social History   Social History  . Marital Status: Married    Spouse Name: N/A  . Number of Children: 2  . Years of Education: N/A   Occupational History  . consignment shop    Social History Main Topics  . Smoking status: Former Research scientist (life sciences)  . Smokeless tobacco: Never Used  . Alcohol Use: Yes     Comment: occ  . Drug Use: No  . Sexual Activity: Not on file   Other Topics Concern  . Not on file   Social History Narrative   Has living will   Husband is [place] health care POA   Would accept resuscitation but no prolonged ventilation   Probably would not want tube feedings if cognitively unaware   Review of Systems  Some constipation lately--had one yesterday though No fever that she can tell     Objective:   Physical Exam  Constitutional:  Looks uncomfortable but not in  distress No problems getting up on table  Abdominal: Soft. She exhibits no distension. There is no rebound and no guarding.  Vague left abdominal tenderness  Musculoskeletal:  + left CVA tenderness          Assessment & Plan:

## 2015-08-15 NOTE — Telephone Encounter (Signed)
Order put in.

## 2015-08-15 NOTE — Telephone Encounter (Signed)
Dominique Hall (DPR signed) said pt was seen [facility] ED on 08/14/15 with lt sided kidney stone;pt was advised to schedule appt with Dr Yves Dill the urologist but pts husband said Dr Yves Dill is not taking new pts. Last urologist pt saw was in [place]. Dominique Hall request assistance with referral to urologist ASAP. Pt is not in pain now but is very nauseated. Dominique Hall request cb. Last annual exam 03/17/15.

## 2015-08-15 NOTE — Patient Instructions (Signed)
Please start the tamsulosin. Try to increase your fluids--whatever you like. It might help to try some pedialyte though.

## 2015-08-16 ENCOUNTER — Encounter (HOSPITAL_COMMUNITY): Payer: Self-pay | Admitting: Emergency Medicine

## 2015-08-16 ENCOUNTER — Emergency Department (HOSPITAL_COMMUNITY)
Admission: EM | Admit: 2015-08-16 | Discharge: 2015-08-16 | Discharge status: Against Medical Advice | Payer: Medicare Other | Attending: Emergency Medicine | Admitting: Emergency Medicine

## 2015-08-16 DIAGNOSIS — R109 Unspecified abdominal pain: Secondary | ICD-10-CM | POA: Diagnosis not present

## 2015-08-16 DIAGNOSIS — I10 Essential (primary) hypertension: Secondary | ICD-10-CM | POA: Insufficient documentation

## 2015-08-16 HISTORY — DX: Disorder of kidney and ureter, unspecified: N28.9

## 2015-08-16 LAB — CBC
HCT: 37.8 % (ref 36.0–46.0)
HEMOGLOBIN: 13.1 g/dL (ref 12.0–15.0)
MCH: 29.4 pg (ref 26.0–34.0)
MCHC: 34.7 g/dL (ref 30.0–36.0)
MCV: 84.9 fL (ref 78.0–100.0)
Platelets: 189 10*3/uL (ref 150–400)
RBC: 4.45 MIL/uL (ref 3.87–5.11)
RDW: 12.5 % (ref 11.5–15.5)
WBC: 6.2 10*3/uL (ref 4.0–10.5)

## 2015-08-16 LAB — BASIC METABOLIC PANEL
ANION GAP: 6 (ref 5–15)
BUN: 21 mg/dL — ABNORMAL HIGH (ref 6–20)
CALCIUM: 9.5 mg/dL (ref 8.9–10.3)
CO2: 28 mmol/L (ref 22–32)
Chloride: 105 mmol/L (ref 101–111)
Creatinine, Ser: 0.72 mg/dL (ref 0.44–1.00)
Glucose, Bld: 97 mg/dL (ref 65–99)
POTASSIUM: 3.6 mmol/L (ref 3.5–5.1)
Sodium: 139 mmol/L (ref 135–145)

## 2015-08-16 NOTE — ED Notes (Signed)
Per patient, states she was diagnosed with left kidney stone on Sunday-appointment to have it removed tomorrow

## 2015-08-17 DIAGNOSIS — N201 Calculus of ureter: Secondary | ICD-10-CM | POA: Diagnosis not present

## 2015-08-24 DIAGNOSIS — N201 Calculus of ureter: Secondary | ICD-10-CM | POA: Diagnosis not present

## 2015-09-06 ENCOUNTER — Other Ambulatory Visit: Payer: Self-pay | Admitting: Internal Medicine

## 2015-09-07 ENCOUNTER — Other Ambulatory Visit: Payer: Self-pay | Admitting: Internal Medicine

## 2015-10-04 ENCOUNTER — Other Ambulatory Visit: Payer: Self-pay

## 2015-10-04 DIAGNOSIS — Z1231 Encounter for screening mammogram for malignant neoplasm of breast: Secondary | ICD-10-CM

## 2015-10-09 ENCOUNTER — Encounter: Payer: Self-pay | Admitting: Internal Medicine

## 2015-10-26 ENCOUNTER — Other Ambulatory Visit: Payer: Self-pay | Admitting: Internal Medicine

## 2015-10-27 ENCOUNTER — Other Ambulatory Visit: Payer: Self-pay | Admitting: Internal Medicine

## 2015-10-27 NOTE — Telephone Encounter (Signed)
Approved: #[phone] x 0 

## 2015-10-27 NOTE — Telephone Encounter (Signed)
rx called into pharmacy

## 2015-10-27 NOTE — Telephone Encounter (Signed)
09/06/2015 

## 2015-11-01 ENCOUNTER — Ambulatory Visit (INDEPENDENT_AMBULATORY_CARE_PROVIDER_SITE_OTHER): Payer: Medicare Other | Admitting: Primary Care

## 2015-11-01 ENCOUNTER — Other Ambulatory Visit: Payer: Self-pay | Admitting: Internal Medicine

## 2015-11-01 ENCOUNTER — Encounter: Payer: Self-pay | Admitting: Primary Care

## 2015-11-01 VITALS — BP 116/70 | HR 58 | Temp 97.9°F | Ht 65.0 in | Wt 148.4 lb

## 2015-11-01 DIAGNOSIS — R05 Cough: Secondary | ICD-10-CM | POA: Diagnosis not present

## 2015-11-01 DIAGNOSIS — R059 Cough, unspecified: Secondary | ICD-10-CM

## 2015-11-01 MED ORDER — AZITHROMYCIN 250 MG PO TABS: ORAL_TABLET | ORAL | Status: DC

## 2015-11-01 NOTE — Progress Notes (Signed)
Pre visit review using our clinic review tool, if applicable. No additional management support is needed unless otherwise documented below in the visit note. 

## 2015-11-01 NOTE — Progress Notes (Signed)
Subjective:    Patient ID: Dominique Hall, female    DOB: 09-29-1935, 80 y.o.   MRN: [id number]  HPI  Dominique Hall is a 80 year old female who presents today with a chief complaint of cough. She also reports sore throat, nasal congestion, and sinus pressure. Her symptoms began on Sunday this week with a dry/hackey cough. Yesterday she noticed an increase in congestion, and this morning she noticed the sore throat. She's been gargling whiskey and brown sugar at home for sore throat. Denies fevers, chills. Her most bothersome symptom is her sinus pressure. She is blowing a slight amount of light yellow mucous from her nasal cavity.   Review of Systems  Constitutional: Negative for fever and chills.  HENT: Positive for congestion, sinus pressure and sore throat. Negative for ear pain.   Respiratory: Positive for cough. Negative for shortness of breath.   Cardiovascular: Negative for chest pain.  Gastrointestinal: Negative for nausea.       Past Medical History  Diagnosis Date  . Allergy   . Arthritis   . Sleep disturbance   . Diverticulosis   . GERD (gastroesophageal reflux disease)   . Hiatal hernia   . HTN (hypertension)   . Osteopenia   . IBS (irritable bowel syndrome)   . Esophageal dysmotility   . Esophageal ring   . Renal disorder     Social History   Social History  . Marital Status: Married    Spouse Name: N/A  . Number of Children: 2  . Years of Education: N/A   Occupational History  . consignment shop    Social History Main Topics  . Smoking status: Former Research scientist (life sciences)  . Smokeless tobacco: Never Used  . Alcohol Use: Yes     Comment: occ  . Drug Use: No  . Sexual Activity: Not on file   Other Topics Concern  . Not on file   Social History Narrative   Has living will   Husband is [place] health care POA   Would accept resuscitation but no prolonged ventilation   Probably would not want tube feedings if cognitively unaware    Past Surgical History  Procedure  Laterality Date  . Squamous cell carcinoma excision  6/12    Right leg--Dr Pat Patrick  . Lumpectomy  [phone]    benign right breast  . Appendectomy  1973  . Total abdominal hysterectomy w/ bilateral salpingoophorectomy  1973  . Kidney surgery  1970  . Esophagogastroduodenoscopy  07-2006    with dilation  . Cholecystectomy    . Abdominal hysterectomy    . Squamous cell carcinoma excision  2012    right calf    Family History  Problem Relation Age of Onset  . Stroke Mother   . Heart attack Father     Allergies  Allergen Reactions  . Penicillins     REACTION: unspecified  . Rabeprazole Sodium     REACTION: constipated  . Sulfonamide Derivatives     REACTION: unspecified  . Cefuroxime     diarrhea    Current Outpatient Prescriptions on File Prior to Visit  Medication Sig Dispense Refill  . clorazepate (TRANXENE) 7.5 MG tablet TAKE 1 TABLET BY MOUTH AT BEDTIME 90 tablet 0  . estradiol (ESTRACE) 0.1 MG/GM vaginal cream Place 1 Applicatorful vaginally 3 (three) times a week. 42.5 g 11  . hydrochlorothiazide (MICROZIDE) 12.5 MG capsule TAKE 1 CAPSULE EVERY       MORNING 90 capsule 3  . Multiple  Vitamin (MULTIVITAMIN) tablet Take 1 tablet by mouth daily.    Marland Kitchen NEXIUM 40 MG capsule TAKE 1 CAPSULE DAILY 90 capsule 3  . Omega-3 Fatty Acids (FISH OIL PO) Take 1 capsule by mouth daily.    . ondansetron (ZOFRAN ODT) 4 MG disintegrating tablet Take 1 tablet (4 mg total) by mouth every 8 (eight) hours as needed for nausea or vomiting. 20 tablet 0  . Probiotic Product ([place]) CAPS Take by mouth daily.    Marland Kitchen ZANTAC 150 MG tablet TAKE 1 TABLET AT BEDTIME 90 tablet 3  . promethazine (PHENERGAN) 12.5 MG tablet Take 1 tablet (12.5 mg total) by mouth every 6 (six) hours as needed for nausea or vomiting. (Patient not taking: Reported on 11/01/2015) 20 tablet 0  . traMADol (ULTRAM) 50 MG tablet Take 1 tablet (50 mg total) by mouth every 6 (six) hours as needed. (Patient not taking: Reported  on 11/01/2015) 20 tablet 0   No current facility-administered medications on file prior to visit.    BP 116/70 mmHg  Pulse 58  Temp(Src) 97.9 F (36.6 C) (Oral)  Ht 5\' 5"  (1.651 m)  Wt 148 lb 6.4 oz (67.314 kg)  BMI 24.70 kg/m2    Objective:   Physical Exam  Constitutional: She appears well-nourished.  HENT:  Right Ear: Tympanic membrane and ear canal normal.  Left Ear: Tympanic membrane and ear canal normal.  Nose: Right sinus exhibits maxillary sinus tenderness. Right sinus exhibits no frontal sinus tenderness. Left sinus exhibits maxillary sinus tenderness. Left sinus exhibits no frontal sinus tenderness.  Mouth/Throat: Oropharynx is clear and moist.  Eyes: Conjunctivae are normal. Pupils are equal, round, and reactive to light.  Neck: Neck supple.  Cardiovascular: Normal rate and regular rhythm.   Pulmonary/Chest: Effort normal and breath sounds normal. She has no rales.  Lymphadenopathy:    She has no cervical adenopathy.  Skin: Skin is warm and dry.          Assessment & Plan:  URI:  Cough, sinus pressure, congestion, sore throat x 2 days. Has not tried OTC treatment. Exam unremarkable mostly. Lungs clear, does not appear ill, moderate tenderness to right maxillary sinus.   She is leaving town Thursday and is adamant about receiving an antibiotic for prevention. Discussed that her symptoms are likely viral, and that antibiotics will not be effective.   Since she is leaving town, will print RX for [place] and have her fill Sunday next week if no improvement in symptoms. Will treat her with OTC supportive measures. (flonase, robitussin DM, fluids, rest). Follow up PRN.

## 2015-11-01 NOTE — Patient Instructions (Signed)
Your symptoms are likely related to a viral infection at this point.  Start Azithromycin antibiotics on Sunday if no improvement in symptoms. Take 2 tablets by mouth on day 1, then 1 tablet daily for 4 additional days.  Nasal Congestion: Fluticasone (Flonase) nasal spray. Instill 2 sprays in each nostril once daily.  Cough/Chest congestion: Robitussin DM. This may be purchased over the counter.  Increase consumption of fluids and rest.  Upper Respiratory Infection, Adult Most upper respiratory infections (URIs) are a viral infection of the air passages leading to the lungs. A URI affects the nose, throat, and upper air passages. The most common type of URI is nasopharyngitis and is typically referred to as "the common cold." URIs run their course and usually go away on their own. Most of the time, a URI does not require medical attention, but sometimes a bacterial infection in the upper airways can follow a viral infection. This is called a secondary infection. Sinus and middle ear infections are common types of secondary upper respiratory infections. Bacterial pneumonia can also complicate a URI. A URI can worsen asthma and chronic obstructive pulmonary disease (COPD). Sometimes, these complications can require emergency medical care and may be life threatening.  CAUSES Almost all URIs are caused by viruses. A virus is a type of germ and can spread from one person to another.  RISKS FACTORS You may be at risk for a URI if:   You smoke.   You have chronic heart or lung disease.  You have a weakened defense (immune) system.   You are very young or very old.   You have nasal allergies or asthma.  You work in crowded or poorly ventilated areas.  You work in health care facilities or schools. SIGNS AND SYMPTOMS  Symptoms typically develop 2-3 days after you come in contact with a cold virus. Most viral URIs last 7-10 days. However, viral URIs from the influenza virus (flu virus) can  last 14-18 days and are typically more severe. Symptoms may include:   Runny or stuffy (congested) nose.   Sneezing.   Cough.   Sore throat.   Headache.   Fatigue.   Fever.   Loss of appetite.   Pain in your forehead, behind your eyes, and over your cheekbones (sinus pain).  Muscle aches.  DIAGNOSIS  Your health care provider may diagnose a URI by:  Physical exam.  Tests to check that your symptoms are not due to another condition such as:  Strep throat.  Sinusitis.  Pneumonia.  Asthma. TREATMENT  A URI goes away on its own with time. It cannot be cured with medicines, but medicines may be prescribed or recommended to relieve symptoms. Medicines may help:  Reduce your fever.  Reduce your cough.  Relieve nasal congestion. HOME CARE INSTRUCTIONS   Take medicines only as directed by your health care provider.   Gargle warm saltwater or take cough drops to comfort your throat as directed by your health care provider.  Use a warm mist humidifier or inhale steam from a shower to increase air moisture. This may make it easier to breathe.  Drink enough fluid to keep your urine clear or pale yellow.   Eat soups and other clear broths and maintain good nutrition.   Rest as needed.   Return to work when your temperature has returned to normal or as your health care provider advises. You may need to stay home longer to avoid infecting others. You can also use a face mask  and careful hand washing to prevent spread of the virus.  Increase the usage of your inhaler if you have asthma.   Do not use any tobacco products, including cigarettes, chewing tobacco, or electronic cigarettes. If you need help quitting, ask your health care provider. PREVENTION  The best way to protect yourself from getting a cold is to practice good hygiene.   Avoid oral or hand contact with people with cold symptoms.   Wash your hands often if contact occurs.  There is no  clear evidence that vitamin C, vitamin E, echinacea, or exercise reduces the chance of developing a cold. However, it is always recommended to get plenty of rest, exercise, and practice good nutrition.  SEEK MEDICAL CARE IF:   You are getting worse rather than better.   Your symptoms are not controlled by medicine.   You have chills.  You have worsening shortness of breath.  You have brown or red mucus.  You have yellow or brown nasal discharge.  You have pain in your face, especially when you bend forward.  You have a fever.  You have swollen neck glands.  You have pain while swallowing.  You have white areas in the back of your throat. SEEK IMMEDIATE MEDICAL CARE IF:   You have severe or persistent:  Headache.  Ear pain.  Sinus pain.  Chest pain.  You have chronic lung disease and any of the following:  Wheezing.  Prolonged cough.  Coughing up blood.  A change in your usual mucus.  You have a stiff neck.  You have changes in your:  Vision.  Hearing.  Thinking.  Mood. MAKE SURE YOU:   Understand these instructions.  Will watch your condition.  Will get help right away if you are not doing well or get worse.   This information is not intended to replace advice given to you by your health care provider. Make sure you discuss any questions you have with your health care provider.   Document Released: 04/24/2001 Document Revised: 03/15/2015 Document Reviewed: 02/03/2014 Elsevier Interactive Patient Education [employer].

## 2015-11-09 ENCOUNTER — Ambulatory Visit (INDEPENDENT_AMBULATORY_CARE_PROVIDER_SITE_OTHER): Payer: Medicare Other | Admitting: Internal Medicine

## 2015-11-09 ENCOUNTER — Ambulatory Visit (INDEPENDENT_AMBULATORY_CARE_PROVIDER_SITE_OTHER)
Admission: RE | Admit: 2015-11-09 | Discharge: 2015-11-09 | Disposition: A | Discharge status: Home / Self Care | Payer: Medicare Other | Source: Ambulatory Visit | Attending: Internal Medicine | Admitting: Internal Medicine

## 2015-11-09 ENCOUNTER — Telehealth: Payer: Self-pay

## 2015-11-09 ENCOUNTER — Encounter: Payer: Self-pay | Admitting: Internal Medicine

## 2015-11-09 VITALS — BP 112/60 | HR 78 | Temp 97.9°F | Resp 16 | Wt 147.0 lb

## 2015-11-09 DIAGNOSIS — R0602 Shortness of breath: Secondary | ICD-10-CM | POA: Diagnosis not present

## 2015-11-09 DIAGNOSIS — R05 Cough: Secondary | ICD-10-CM | POA: Diagnosis not present

## 2015-11-09 DIAGNOSIS — R059 Cough, unspecified: Secondary | ICD-10-CM | POA: Insufficient documentation

## 2015-11-09 MED ORDER — CLINDAMYCIN HCL 300 MG PO CAPS: 300.0000 mg | ORAL_CAPSULE | Freq: Three times a day (TID) | ORAL | Status: DC

## 2015-11-09 MED ORDER — LEVOFLOXACIN 500 MG PO TABS: 500.0000 mg | ORAL_TABLET | Freq: Every day | ORAL | Status: DC

## 2015-11-09 NOTE — Assessment & Plan Note (Addendum)
Has been "really sick" with some systemic symptoms and SOB Will check CXR to exclude partially treated pneumonia--though probably just sinusitis CXR looks okay---will await overread Will Rx with clinda--unless the radiologist feels there is some abnormality  Reviewed radiologist's report Will change to levaquin

## 2015-11-09 NOTE — Progress Notes (Signed)
Pre visit review using our clinic review tool, if applicable. No additional management support is needed unless otherwise documented below in the visit note. 

## 2015-11-09 NOTE — Addendum Note (Signed)
Addended by: [name] I on: 11/09/2015 11:36 AM   Modules accepted: Orders, Medications

## 2015-11-09 NOTE — Telephone Encounter (Signed)
Mr Parden called to verify that levaquin is the abx pt is supposed to be taking. Looked at 11/09/15 office note and advised pt abx was changed to levaquin. Mr Cavaness voiced understanding.

## 2015-11-09 NOTE — Progress Notes (Signed)
Subjective:    Patient ID: Dominique Hall, female    DOB: [birth year], 80 y.o.   MRN: [id number]  HPI Here with husband--- still sick "had a rough time"  Did try the z-pak Still having bad cough--though some better Appetite is gone mucinex seems to help--- and taking whiskey and brown sugar  May have had low grade fever---freezing then burning up at night. Some sweats as well Some SOB-- both at rest and with activity (mostly if doing something) No headache Slight left ear pain a few days ago--thinks it was from cough Does have nasal drainage and PND---still yellow  Current Outpatient Prescriptions on File Prior to Visit  Medication Sig Dispense Refill  . clorazepate (TRANXENE) 7.5 MG tablet TAKE 1 TABLET BY MOUTH AT BEDTIME 90 tablet 0  . estradiol (ESTRACE) 0.1 MG/GM vaginal cream Place 1 Applicatorful vaginally 3 (three) times a week. 42.5 g 11  . hydrochlorothiazide (MICROZIDE) 12.5 MG capsule TAKE 1 CAPSULE EVERY       MORNING 90 capsule 3  . Multiple Vitamin (MULTIVITAMIN) tablet Take 1 tablet by mouth daily.    Marland Kitchen NEXIUM 40 MG capsule TAKE 1 CAPSULE DAILY 90 capsule 3  . Omega-3 Fatty Acids (FISH OIL PO) Take 1 capsule by mouth daily.    . ondansetron (ZOFRAN ODT) 4 MG disintegrating tablet Take 1 tablet (4 mg total) by mouth every 8 (eight) hours as needed for nausea or vomiting. 20 tablet 0  . Probiotic Product ([place]) CAPS Take by mouth daily.    Marland Kitchen ZANTAC 150 MG tablet TAKE 1 TABLET AT BEDTIME 90 tablet 3   No current facility-administered medications on file prior to visit.    Allergies  Allergen Reactions  . Penicillins     REACTION: unspecified  . Rabeprazole Sodium     REACTION: constipated  . Sulfonamide Derivatives     REACTION: unspecified  . Cefuroxime     diarrhea    Past Medical History  Diagnosis Date  . Allergy   . Arthritis   . Sleep disturbance   . Diverticulosis   . GERD (gastroesophageal reflux disease)   . Hiatal hernia    . HTN (hypertension)   . Osteopenia   . IBS (irritable bowel syndrome)   . Esophageal dysmotility   . Esophageal ring   . Renal disorder     Past Surgical History  Procedure Laterality Date  . Squamous cell carcinoma excision  6/12    Right leg--Dr Pat Patrick  . Lumpectomy  [phone]    benign right breast  . Appendectomy  1973  . Total abdominal hysterectomy w/ bilateral salpingoophorectomy  1973  . Kidney surgery  1970  . Esophagogastroduodenoscopy  07-2006    with dilation  . Cholecystectomy    . Abdominal hysterectomy    . Squamous cell carcinoma excision  2012    right calf    Family History  Problem Relation Age of Onset  . Stroke Mother   . Heart attack Father     Social History   Social History  . Marital Status: Married    Spouse Name: N/A  . Number of Children: 2  . Years of Education: N/A   Occupational History  . consignment shop    Social History Main Topics  . Smoking status: Former Research scientist (life sciences)  . Smokeless tobacco: Never Used  . Alcohol Use: Yes     Comment: occ  . Drug Use: No  . Sexual Activity: Not on file  Other Topics Concern  . Not on file   Social History Narrative   Has living will   Husband is [place] health care POA   Would accept resuscitation but no prolonged ventilation   Probably would not want tube feedings if cognitively unaware   Review of Systems  No vomiting or diarrhea Able to eat but appetite is off No rash     Objective:   Physical Exam  Constitutional:  Looks slightly tired but no distress  HENT:  Mild maxillary tenderness TMs normal Mild nasal inflammation Pharynx normal  Neck: Normal range of motion. Neck supple.  Non tender bilateral cervical nodes  Pulmonary/Chest: Effort normal and breath sounds normal. No respiratory distress. She has no wheezes. She has no rales.  Skin: No rash noted.          Assessment & Plan:

## 2015-11-11 ENCOUNTER — Encounter: Payer: Self-pay | Admitting: Internal Medicine

## 2015-11-21 ENCOUNTER — Ambulatory Visit: Payer: Medicare Other | Admitting: Internal Medicine

## 2015-11-21 ENCOUNTER — Encounter: Payer: Self-pay | Admitting: Internal Medicine

## 2015-11-21 DIAGNOSIS — J189 Pneumonia, unspecified organism: Secondary | ICD-10-CM

## 2015-11-21 NOTE — Telephone Encounter (Signed)
Please let her know that it is fairly routine to recheck a CXR after a possible pneumonia to make sure it resolves. This should be done at least a month later Please set her up for appt in early February---I already put in the order

## 2015-11-22 ENCOUNTER — Telehealth: Payer: Self-pay | Admitting: Internal Medicine

## 2015-11-22 NOTE — Telephone Encounter (Signed)
.  left message to have patient return my call. Also sent message thru [place]

## 2015-11-22 NOTE — Telephone Encounter (Signed)
See mychart msg 11/20/14

## 2015-11-22 NOTE — Telephone Encounter (Signed)
Patient called back and will come in 12/22/2015 @ 2pm for repeat chest x-ray

## 2015-11-22 NOTE — Telephone Encounter (Signed)
Dee, Pt called returning your message left. You can reach her at [phone].

## 2015-11-28 ENCOUNTER — Ambulatory Visit: Payer: Medicare Other | Admitting: Internal Medicine

## 2015-12-14 DIAGNOSIS — H2513 Age-related nuclear cataract, bilateral: Secondary | ICD-10-CM | POA: Diagnosis not present

## 2015-12-20 ENCOUNTER — Encounter: Payer: Self-pay | Admitting: Internal Medicine

## 2015-12-20 ENCOUNTER — Ambulatory Visit
Admission: RE | Admit: 2015-12-20 | Discharge: 2015-12-20 | Disposition: A | Discharge status: Home / Self Care | Payer: Medicare Other | Source: Ambulatory Visit | Attending: Internal Medicine | Admitting: Internal Medicine

## 2015-12-20 DIAGNOSIS — J189 Pneumonia, unspecified organism: Secondary | ICD-10-CM

## 2015-12-22 ENCOUNTER — Ambulatory Visit (INDEPENDENT_AMBULATORY_CARE_PROVIDER_SITE_OTHER): Payer: Medicare Other | Admitting: Internal Medicine

## 2015-12-22 ENCOUNTER — Encounter: Payer: Self-pay | Admitting: Internal Medicine

## 2015-12-22 ENCOUNTER — Ambulatory Visit (INDEPENDENT_AMBULATORY_CARE_PROVIDER_SITE_OTHER)
Admission: RE | Admit: 2015-12-22 | Discharge: 2015-12-22 | Disposition: A | Discharge status: Home / Self Care | Payer: Medicare Other | Source: Ambulatory Visit | Attending: Internal Medicine | Admitting: Internal Medicine

## 2015-12-22 VITALS — BP 128/78 | HR 69 | Temp 98.2°F | Wt 149.0 lb

## 2015-12-22 DIAGNOSIS — R911 Solitary pulmonary nodule: Secondary | ICD-10-CM | POA: Diagnosis not present

## 2015-12-22 DIAGNOSIS — J189 Pneumonia, unspecified organism: Secondary | ICD-10-CM | POA: Diagnosis not present

## 2015-12-22 DIAGNOSIS — Z23 Encounter for immunization: Secondary | ICD-10-CM | POA: Diagnosis not present

## 2015-12-22 DIAGNOSIS — R05 Cough: Secondary | ICD-10-CM | POA: Diagnosis not present

## 2015-12-22 DIAGNOSIS — R059 Cough, unspecified: Secondary | ICD-10-CM

## 2015-12-22 NOTE — Assessment & Plan Note (Signed)
97mm in LLL Will discuss at next visit whether to repeat CT scan about this

## 2015-12-22 NOTE — Progress Notes (Signed)
Pre visit review using our clinic review tool, if applicable. No additional management support is needed unless otherwise documented below in the visit note. 

## 2015-12-22 NOTE — Assessment & Plan Note (Signed)
With mild CXR findings Symptoms have resolved CXR looks back to normal ---will await radiology overread

## 2015-12-22 NOTE — Progress Notes (Signed)
Subjective:    Patient ID: Dominique Hall, female    DOB: May 15, 1935, 81 y.o.   MRN: [id number]  HPI Here with husband for follow up of apparent pneumonia Cough finally gone No fever Breathing is back to normal  Reviewed 10/16 CT--stone protocol Showed 49mm LLL nodule and recommendation for repeat  Current Outpatient Prescriptions on File Prior to Visit  Medication Sig Dispense Refill  . clorazepate (TRANXENE) 7.5 MG tablet TAKE 1 TABLET BY MOUTH AT BEDTIME 90 tablet 0  . estradiol (ESTRACE) 0.1 MG/GM vaginal cream Place 1 Applicatorful vaginally 3 (three) times a week. 42.5 g 11  . hydrochlorothiazide (MICROZIDE) 12.5 MG capsule TAKE 1 CAPSULE EVERY       MORNING 90 capsule 3  . Multiple Vitamin (MULTIVITAMIN) tablet Take 1 tablet by mouth daily.    Marland Kitchen NEXIUM 40 MG capsule TAKE 1 CAPSULE DAILY 90 capsule 3  . Omega-3 Fatty Acids (FISH OIL PO) Take 1 capsule by mouth daily.    . ondansetron (ZOFRAN ODT) 4 MG disintegrating tablet Take 1 tablet (4 mg total) by mouth every 8 (eight) hours as needed for nausea or vomiting. 20 tablet 0  . Probiotic Product ([place]) CAPS Take by mouth daily.    Marland Kitchen ZANTAC 150 MG tablet TAKE 1 TABLET AT BEDTIME 90 tablet 3   No current facility-administered medications on file prior to visit.    Allergies  Allergen Reactions  . Penicillins     REACTION: unspecified  . Rabeprazole Sodium     REACTION: constipated  . Sulfonamide Derivatives     REACTION: unspecified  . Cefuroxime     diarrhea    Past Medical History  Diagnosis Date  . Allergy   . Arthritis   . Sleep disturbance   . Diverticulosis   . GERD (gastroesophageal reflux disease)   . Hiatal hernia   . HTN (hypertension)   . Osteopenia   . IBS (irritable bowel syndrome)   . Esophageal dysmotility   . Esophageal ring   . Renal disorder     Past Surgical History  Procedure Laterality Date  . Squamous cell carcinoma excision  6/12    Right leg--Dr Pat Patrick  .  Lumpectomy  [phone]    benign right breast  . Appendectomy  1973  . Total abdominal hysterectomy w/ bilateral salpingoophorectomy  1973  . Kidney surgery  1970  . Esophagogastroduodenoscopy  07-2006    with dilation  . Cholecystectomy    . Abdominal hysterectomy    . Squamous cell carcinoma excision  2012    right calf    Family History  Problem Relation Age of Onset  . Stroke Mother   . Heart attack Father     Social History   Social History  . Marital Status: Married    Spouse Name: N/A  . Number of Children: 2  . Years of Education: N/A   Occupational History  . consignment shop    Social History Main Topics  . Smoking status: Former Research scientist (life sciences)  . Smokeless tobacco: Never Used  . Alcohol Use: Yes     Comment: occ  . Drug Use: No  . Sexual Activity: Not on file   Other Topics Concern  . Not on file   Social History Narrative   Has living will   Husband is [place] health care POA   Would accept resuscitation but no prolonged ventilation   Probably would not want tube feedings if cognitively unaware   Review of  Systems  Chronic constipation persists---has used miralax twice and it works Appetite is good Weight is stable---or up slightly Sleeps well     Objective:   Physical Exam  Constitutional: She appears well-developed and well-nourished. No distress.  Neck: Normal range of motion. No thyromegaly present.  Cardiovascular: Normal rate, regular rhythm and normal heart sounds.  Exam reveals no gallop.   No murmur heard. Pulmonary/Chest: Effort normal and breath sounds normal. No respiratory distress. She has no wheezes. She has no rales.  Lymphadenopathy:    She has no cervical adenopathy.          Assessment & Plan:

## 2015-12-22 NOTE — Addendum Note (Signed)
Addended by: Despina Hidden on: 12/22/2015 03:17 PM   Modules accepted: Orders

## 2016-01-23 ENCOUNTER — Ambulatory Visit
Admission: RE | Admit: 2016-01-23 | Discharge: 2016-01-23 | Disposition: A | Discharge status: Home / Self Care | Payer: Medicare Other | Source: Ambulatory Visit

## 2016-01-23 ENCOUNTER — Other Ambulatory Visit: Payer: Self-pay | Admitting: Primary Care

## 2016-01-23 ENCOUNTER — Encounter: Payer: Self-pay | Admitting: Internal Medicine

## 2016-01-23 DIAGNOSIS — Z1231 Encounter for screening mammogram for malignant neoplasm of breast: Secondary | ICD-10-CM | POA: Diagnosis not present

## 2016-01-23 DIAGNOSIS — K219 Gastro-esophageal reflux disease without esophagitis: Secondary | ICD-10-CM

## 2016-01-23 MED ORDER — ZANTAC 150 MG PO TABS: 150.0000 mg | ORAL_TABLET | Freq: Every day | ORAL | Status: DC

## 2016-01-23 NOTE — Telephone Encounter (Signed)
Will forward to another provider to see if they want to do the rx or wait for Dr Silvio Pate to return

## 2016-02-13 ENCOUNTER — Telehealth: Payer: Self-pay

## 2016-02-13 ENCOUNTER — Ambulatory Visit (INDEPENDENT_AMBULATORY_CARE_PROVIDER_SITE_OTHER): Payer: Medicare Other | Admitting: Internal Medicine

## 2016-02-13 ENCOUNTER — Encounter: Payer: Self-pay | Admitting: Internal Medicine

## 2016-02-13 VITALS — BP 110/62 | HR 68 | Temp 98.4°F | Resp 20 | Wt 150.0 lb

## 2016-02-13 DIAGNOSIS — J0100 Acute maxillary sinusitis, unspecified: Secondary | ICD-10-CM | POA: Diagnosis not present

## 2016-02-13 NOTE — Patient Instructions (Signed)
Please call at end of week or next week if you are getting worse.

## 2016-02-13 NOTE — Assessment & Plan Note (Signed)
Fairly recent onset so probably viral Discussed supportive care If worsens by next week, could try empiric antibiotic

## 2016-02-13 NOTE — Progress Notes (Signed)
Pre visit review using our clinic review tool, if applicable. No additional management support is needed unless otherwise documented below in the visit note. 

## 2016-02-13 NOTE — Progress Notes (Signed)
Subjective:    Patient ID: Dominique Hall, female    DOB: [birth year], 81 y.o.   MRN: [id number]  HPI Here due to respiratory illness  Started 2 days ago Achy but this is better Regular cough ---worse now with illness Fever last night Some SOB--with activity Cough is dry Sinuses are draining and has PND Bad sore throat---initial symptom No ear pain  Robitussin DM helps cough. Ibuprofen helps fever  Current Outpatient Prescriptions on File Prior to Visit  Medication Sig Dispense Refill  . clorazepate (TRANXENE) 7.5 MG tablet TAKE 1 TABLET BY MOUTH AT BEDTIME 90 tablet 0  . estradiol (ESTRACE) 0.1 MG/GM vaginal cream Place 1 Applicatorful vaginally 3 (three) times a week. 42.5 g 11  . hydrochlorothiazide (MICROZIDE) 12.5 MG capsule TAKE 1 CAPSULE EVERY       MORNING 90 capsule 3  . Multiple Vitamin (MULTIVITAMIN) tablet Take 1 tablet by mouth daily.    Marland Kitchen NEXIUM 40 MG capsule TAKE 1 CAPSULE DAILY 90 capsule 3  . Omega-3 Fatty Acids (FISH OIL PO) Take 1 capsule by mouth daily.    Marland Kitchen ZANTAC 150 MG tablet Take 1 tablet (150 mg total) by mouth at bedtime. 90 tablet 3   No current facility-administered medications on file prior to visit.    Allergies  Allergen Reactions  . Penicillins     REACTION: unspecified  . Rabeprazole Sodium     REACTION: constipated  . Sulfonamide Derivatives     REACTION: unspecified  . Cefuroxime     diarrhea    Past Medical History  Diagnosis Date  . Allergy   . Arthritis   . Sleep disturbance   . Diverticulosis   . GERD (gastroesophageal reflux disease)   . Hiatal hernia   . HTN (hypertension)   . Osteopenia   . IBS (irritable bowel syndrome)   . Esophageal dysmotility   . Esophageal ring   . Renal disorder     Past Surgical History  Procedure Laterality Date  . Squamous cell carcinoma excision  6/12    Right leg--Dr Pat Patrick  . Lumpectomy  [phone]    benign right breast  . Appendectomy  1973  . Total abdominal hysterectomy w/  bilateral salpingoophorectomy  1973  . Kidney surgery  1970  . Esophagogastroduodenoscopy  07-2006    with dilation  . Cholecystectomy    . Abdominal hysterectomy    . Squamous cell carcinoma excision  2012    right calf    Family History  Problem Relation Age of Onset  . Stroke Mother   . Heart attack Father     Social History   Social History  . Marital Status: Married    Spouse Name: N/A  . Number of Children: 2  . Years of Education: N/A   Occupational History  . consignment shop    Social History Main Topics  . Smoking status: Former Research scientist (life sciences)  . Smokeless tobacco: Never Used  . Alcohol Use: Yes     Comment: occ  . Drug Use: No  . Sexual Activity: Not on file   Other Topics Concern  . Not on file   Social History Narrative   Has living will   Husband is [place] health care POA   Would accept resuscitation but no prolonged ventilation   Probably would not want tube feedings if cognitively unaware   Review of Systems  Has been caregiving for husband who just had TKR No rash  No vomiting or diarrhea Appetite is  fine     Objective:   Physical Exam  Constitutional: She appears well-developed and well-nourished. No distress.  HENT:  No sinus tenderness Moderate nasal swelling TMs normal Slight pharyngeal injection  Neck: Normal range of motion. Neck supple. No thyromegaly present.  Pulmonary/Chest: Effort normal and breath sounds normal. No respiratory distress. She has no wheezes. She has no rales.  Lymphadenopathy:    She has no cervical adenopathy.          Assessment & Plan:

## 2016-02-13 NOTE — Telephone Encounter (Signed)
PLEASE NOTE: All timestamps contained within this report are represented as [place] Standard Time. CONFIDENTIALTY NOTICE: This fax transmission is intended only for the addressee. It contains information that is legally privileged, confidential or otherwise protected from use or disclosure. If you are not the intended recipient, you are strictly prohibited from reviewing, disclosing, copying using or disseminating any of this information or taking any action in reliance on or regarding this information. If you have received this fax in error, please notify us immediately by telephone so that we can arrange for its return to [place]. Phone: [phone], Toll-Free: [phone], Fax: [phone] Page: 1 of 2 Call Id: [id number] [place] Patient Name: Dominique Hall Gender: Female DOB: 11/10/1935 Age: 81 Y 10 M 21 D Return Phone Number: [phone] (Primary) Address: City/State/Zip:  Client [place] Night - Client Client Site [place] Physician [name] Contact Type Call Who Is Calling Patient / Member / Family / Caregiver Call Type Triage / Clinical Relationship To Patient Self Return Phone Number [phone] (Primary) Chief Complaint Sore Throat Reason for Call Symptomatic / Request for Health Information Initial Comment caller states she has a sore throat and congestion PreDisposition Go to Urgent Care/Walk-In Clinic Translation No Nurse Assessment Nurse: Harlow Mares, RN, Suanne Marker Date/Time (Eastern Time): 02/11/2016 9:38:04 AM Confirm and document reason for call. If symptomatic, describe symptoms. You must click the next button to save text entered. ---caller states she has a sore throat and congestion, runny nose. Reports that the last time she had this she got pneumonia. She refuses to go to ED. She is caring for her husband who  had a knee operation. Reports slight fever, hasn't taken temp. Swallowing liquids without difficulty. Symptoms began yest afternoon. Coughing also, productive. (yellow cough). Nasal drainage (slightly yellow). Has the patient traveled out of the country within the last 30 days? ---No Does the patient have any new or worsening symptoms? ---Yes Will a triage be completed? ---Yes Related visit to physician within the last 2 weeks? ---No Does the PT have any chronic conditions? (i.e. diabetes, asthma, etc.) ---No Is this a behavioral health or substance abuse call? ---No Guidelines Guideline Title Affirmed Question Affirmed Notes Nurse Date/Time Eilene Ghazi Time) Common Cold Cold with no complications (all triage questions negative) Harlow Mares, RN, Rhonda 02/11/2016 9:41:53 AM Disp. Time Eilene Ghazi Time) Disposition Final User 02/11/2016 9:49:48 AM Home Care Yes Harlow Mares, RN, Suanne Marker PLEASE NOTE: All timestamps contained within this report are represented as [place] Standard Time. CONFIDENTIALTY NOTICE: This fax transmission is intended only for the addressee. It contains information that is legally privileged, confidential or otherwise protected from use or disclosure. If you are not the intended recipient, you are strictly prohibited from reviewing, disclosing, copying using or disseminating any of this information or taking any action in reliance on or regarding this information. If you have received this fax in error, please notify us immediately by telephone so that we can arrange for its return to [place]. Phone: [phone], Toll-Free: [phone], Fax: [phone] Page: 2 of 2 Call Id: [id number] Caller Understands: Yes Disagree/Comply: Comply Care Advice Given Per Guideline HOME CARE: You should be able to treat this at home. REASSURANCE: * It sounds like an uncomplicated cold that we can treat at home. * Colds are very common and may make you feel uncomfortable. * Colds are caused by viruses,  and no medicine or 'shot' will cure an  uncomplicated cold. * Colds are usually not serious. FOR A STUFFY NOSE - USE NASAL WASHES: * Introduction: Saline (salt water) nasal irrigation (nasal wash) is an effective and simple home remedy for treating stuffy nose and sinus congestion. The nose can be irrigated by pouring, spraying, or squirting salt water into the nose and then letting it run back out. * How it Helps: The salt water rinses out excess mucus, washes out any irritants (dust, allergens) that might be present, and moistens the nasal cavity. * Methods: There are several ways to perform nasal irrigation. You can use a saline nasal spray bottle (available over-the-counter), a rubber ear syringe, a medical syringe without the needle, or a NETI POT. FOR A RUNNY NOSE - BLOW YOUR NOSE: * Nasal mucus and discharge help wash viruses and bacteria out of the nose and sinuses. * Blowing your nose helps clean out your nose. Use a handkerchief or a paper tissue. * If the skin around your nostrils gets irritated, apply a tiny amount of petroleum ointment to the nasal openings once or twice a day. * OXYMETAZOLINE NASAL DROPS (Afrin) are available OTC. Clean out the nose before using. Spray each nostril once, wait one minute for absorption, and then spray a second time. NASAL DECONGESTANTS FOR A VERY STUFFY NOSE: HUMIDIFIER: If the air in your home is dry, use a humidifier. * For muscle aches, headaches, or moderate fever (over 101 degrees F) (38.9 C) use acetaminophen every 4 hours. TREATMENT FOR ASSOCIATED SYMPTOMS OF COLDS: * Sore throat: throat lozenges, hard candy or warm chicken broth. * Cough: use cough drops. * Hydrate: drink extra liquids. * You can return to work or school after the fever is gone and you feel well enough to participate in normal activities. CONTAGIOUSNESS: * Fever 2-3 days EXPECTED COURSE: * Nasal discharge 7-14 days * Cough 2-3 weeks. CALL BACK IF: * Fever lasts over 3 days * You  become short of breath * You become worse. CARE ADVICE given per Colds (Adult) guideline.

## 2016-02-13 NOTE — Telephone Encounter (Signed)
Pt has appt with Dr Silvio Pate 02/13/16 at 4:15.

## 2016-02-13 NOTE — Telephone Encounter (Signed)
Will see at [place] today. Can review things quickly about her husband also

## 2016-02-16 ENCOUNTER — Telehealth: Payer: Self-pay

## 2016-02-16 NOTE — Telephone Encounter (Signed)
Spoke to daughter. She appreciated the call.

## 2016-02-16 NOTE — Telephone Encounter (Signed)
Please let her know that he probably got it from her I don't think preventative tamiflu makes sense for her at this point

## 2016-02-16 NOTE — Telephone Encounter (Signed)
Estill Bamberg pts daughter (no dpr) said that Alcie Ferrell, pts husband is in the hospital and was diagnosed today with the flu; Estill Bamberg wants to know if Dhara should get tamiflu; Enedelia has non prod cough with chest congestion but no fever.[facility]. Estill Bamberg also wanted to know Dr Alla German opinion of what to give Jari Pigg for his horrible cough; advised Estill Bamberg to speak with doctor that has pt admitted or talk with the nurses at the hospital to get message to the doctor. Estill Bamberg voiced understanding.

## 2016-02-17 ENCOUNTER — Other Ambulatory Visit: Payer: Self-pay | Admitting: Internal Medicine

## 2016-02-17 NOTE — Telephone Encounter (Signed)
Approved: #[phone] x 0 

## 2016-02-17 NOTE — Telephone Encounter (Signed)
Pt left v/m requesting refill clorazepate to [facility]. Last annual exam on 03/17/15. Last refilled # 90 on 10/27/15.

## 2016-02-20 MED ORDER — CLORAZEPATE DIPOTASSIUM 7.5 MG PO TABS: 7.5000 mg | ORAL_TABLET | Freq: Every day | ORAL | Status: DC

## 2016-02-20 NOTE — Telephone Encounter (Signed)
Left refill on voice mail at pharmacy  

## 2016-03-19 ENCOUNTER — Encounter: Payer: Self-pay | Admitting: Internal Medicine

## 2016-03-19 ENCOUNTER — Ambulatory Visit (INDEPENDENT_AMBULATORY_CARE_PROVIDER_SITE_OTHER): Payer: Medicare Other | Admitting: Internal Medicine

## 2016-03-19 VITALS — BP 122/70 | HR 68 | Temp 98.2°F | Ht 64.0 in | Wt 148.0 lb

## 2016-03-19 DIAGNOSIS — Z7189 Other specified counseling: Secondary | ICD-10-CM | POA: Diagnosis not present

## 2016-03-19 DIAGNOSIS — N952 Postmenopausal atrophic vaginitis: Secondary | ICD-10-CM | POA: Diagnosis not present

## 2016-03-19 DIAGNOSIS — F39 Unspecified mood [affective] disorder: Secondary | ICD-10-CM

## 2016-03-19 DIAGNOSIS — I10 Essential (primary) hypertension: Secondary | ICD-10-CM | POA: Diagnosis not present

## 2016-03-19 DIAGNOSIS — Z0000 Encounter for general adult medical examination without abnormal findings: Secondary | ICD-10-CM | POA: Diagnosis not present

## 2016-03-19 DIAGNOSIS — Z23 Encounter for immunization: Secondary | ICD-10-CM

## 2016-03-19 LAB — COMPREHENSIVE METABOLIC PANEL
ALK PHOS: 56 U/L (ref 39–117)
ALT: 14 U/L (ref 0–35)
AST: 16 U/L (ref 0–37)
Albumin: 4.2 g/dL (ref 3.5–5.2)
BUN: 24 mg/dL — AB (ref 6–23)
CO2: 30 mEq/L (ref 19–32)
CREATININE: 0.7 mg/dL (ref 0.40–1.20)
Calcium: 9.8 mg/dL (ref 8.4–10.5)
Chloride: 104 mEq/L (ref 96–112)
GFR: 85.8 mL/min (ref >60.00)
GLUCOSE: 86 mg/dL (ref 70–99)
POTASSIUM: 4.1 meq/L (ref 3.5–5.1)
Sodium: 140 mEq/L (ref 135–145)
TOTAL PROTEIN: 6.5 g/dL (ref 6.0–8.3)
Total Bilirubin: 0.7 mg/dL (ref 0.2–1.2)

## 2016-03-19 LAB — CBC WITH DIFFERENTIAL/PLATELET
Basophils Absolute: 0 10*3/uL (ref 0.0–0.1)
Basophils Relative: 0.1 % (ref 0.0–3.0)
EOS PCT: 2.3 % (ref 0.0–5.0)
Eosinophils Absolute: 0.2 10*3/uL (ref 0.0–0.7)
HCT: 37.6 % (ref 36.0–46.0)
Hemoglobin: 12.6 g/dL (ref 12.0–15.0)
LYMPHS ABS: 1.1 10*3/uL (ref 0.7–4.0)
Lymphocytes Relative: 16.2 % (ref 12.0–46.0)
MCHC: 33.4 g/dL (ref 30.0–36.0)
MCV: 85.6 fl (ref 78.0–100.0)
MONO ABS: 0.4 10*3/uL (ref 0.1–1.0)
Monocytes Relative: 6.2 % (ref 3.0–12.0)
NEUTROS PCT: 75.2 % (ref 43.0–77.0)
Neutro Abs: 5 10*3/uL (ref 1.4–7.7)
Platelets: 176 10*3/uL (ref 150.0–400.0)
RBC: 4.4 Mil/uL (ref 3.87–5.11)
RDW: 13.5 % (ref 11.5–15.5)
WBC: 6.7 10*3/uL (ref 4.0–10.5)

## 2016-03-19 LAB — T4, FREE: FREE T4: 0.81 ng/dL (ref 0.60–1.60)

## 2016-03-19 NOTE — Assessment & Plan Note (Signed)
Rarely needs the tranxene

## 2016-03-19 NOTE — Progress Notes (Signed)
Subjective:    Patient ID: Dominique Hall, female    DOB: 1935-02-18, 81 y.o.   MRN: [id number]  HPI Here for Medicare wellness and follow up of chronic medical conditions Reviewed form and advanced directives Reviewed other doctors Drinks 1 glass of wine daily. Rare mixed drink No tobacco Vision is fine. Ongoing hearing problems---not considering aides at this point No falls No depression or anhedonia Walks--but discussed pushing it more Independent in instrumental ADLs No apparent cognitive problems  She has no new concerns or questions Only medical need this year was her kidney stone--she passed spontaneously  Still on HCTZ Will occasionally check BP---usually 120-138/60's No headaches No chest pain Slight DOE--- if she pushes it. This is stable over a long period of time No dizziness or syncope  Uses the estrogen cream only rarely If she feels really dry---only every 2 months or so  Heartburn controlled with PPI No swallowing problems Hasn't tolerating going off  Stress with husband's surgery and illnesses Uses tranxene if she gets worked up a lot Occasionally will use it to help sleep  Current Outpatient Prescriptions on File Prior to Visit  Medication Sig Dispense Refill  . clorazepate (TRANXENE) 7.5 MG tablet Take 1 tablet (7.5 mg total) by mouth at bedtime. 90 tablet 0  . estradiol (ESTRACE) 0.1 MG/GM vaginal cream Place 1 Applicatorful vaginally 3 (three) times a week. 42.5 g 11  . hydrochlorothiazide (MICROZIDE) 12.5 MG capsule TAKE 1 CAPSULE EVERY       MORNING 90 capsule 3  . Multiple Vitamin (MULTIVITAMIN) tablet Take 1 tablet by mouth daily.    Marland Kitchen NEXIUM 40 MG capsule TAKE 1 CAPSULE DAILY 90 capsule 3  . Omega-3 Fatty Acids (FISH OIL PO) Take 1 capsule by mouth daily.    Marland Kitchen ZANTAC 150 MG tablet Take 1 tablet (150 mg total) by mouth at bedtime. 90 tablet 3   No current facility-administered medications on file prior to visit.    Allergies  Allergen  Reactions  . Penicillins     REACTION: unspecified  . Rabeprazole Sodium     REACTION: constipated  . Sulfonamide Derivatives     REACTION: unspecified  . Cefuroxime     diarrhea    Past Medical History  Diagnosis Date  . Allergy   . Arthritis   . Sleep disturbance   . Diverticulosis   . GERD (gastroesophageal reflux disease)   . Hiatal hernia   . HTN (hypertension)   . Osteopenia   . IBS (irritable bowel syndrome)   . Esophageal dysmotility   . Esophageal ring   . Renal disorder     Past Surgical History  Procedure Laterality Date  . Squamous cell carcinoma excision  6/12    Right leg--Dr Pat Patrick  . Lumpectomy  [phone]    benign right breast  . Appendectomy  1973  . Total abdominal hysterectomy w/ bilateral salpingoophorectomy  1973  . Kidney surgery  1970  . Esophagogastroduodenoscopy  07-2006    with dilation  . Cholecystectomy    . Abdominal hysterectomy    . Squamous cell carcinoma excision  2012    right calf    Family History  Problem Relation Age of Onset  . Stroke Mother   . Heart attack Father     Social History   Social History  . Marital Status: Married    Spouse Name: N/A  . Number of Children: 2  . Years of Education: N/A   Occupational History  .  consignment shop    Social History Main Topics  . Smoking status: Former Research scientist (life sciences)  . Smokeless tobacco: Never Used  . Alcohol Use: Yes     Comment: occ  . Drug Use: No  . Sexual Activity: Not on file   Other Topics Concern  . Not on file   Social History Narrative   Has living will   Husband is [place] health care POA   Would accept resuscitation but no prolonged ventilation   Probably would not want tube feedings if cognitively unaware   Review of Systems Appetite is fine Weight is stable Generally sleeps well Wears seat belt Teeth fine---keeps up with dentist Bowels are fine. No blood No trouble with voiding. Occasional mild stress incontinence---no pads needed Keeps up with Dr  Roseanne Reno cancerous lesion on forehead removed    Objective:   Physical Exam  Constitutional: She is oriented to person, place, and time.  HENT:  Mouth/Throat: Oropharynx is clear and moist. No oropharyngeal exudate.  Neck: Normal range of motion. Neck supple. No thyromegaly present.  Cardiovascular: Normal rate, regular rhythm, normal heart sounds and intact distal pulses.  Exam reveals no gallop.   No murmur heard. Pulmonary/Chest: Effort normal and breath sounds normal. No respiratory distress. She has no wheezes. She has no rales.  Abdominal: Soft. There is no tenderness.  Musculoskeletal: She exhibits no edema or tenderness.  Lymphadenopathy:    She has no cervical adenopathy.  Neurological: She is alert and oriented to person, place, and time.  President-- "Trump, Obama, Clinton----?" [phone] [id number] Recall-- 2/3  Skin: No rash noted. No erythema.  Mycotic left great toenail  Psychiatric: She has a normal mood and affect. Her behavior is normal.          Assessment & Plan:

## 2016-03-19 NOTE — Progress Notes (Signed)
Pre visit review using our clinic review tool, if applicable. No additional management support is needed unless otherwise documented below in the visit note. 

## 2016-03-19 NOTE — Assessment & Plan Note (Signed)
I have personally reviewed the Medicare Annual Wellness questionnaire and have noted 1. The patient's medical and social history 2. Their use of alcohol, tobacco or illicit drugs 3. Their current medications and supplements 4. The patient's functional ability including ADL's, fall risks, home safety risks and hearing or visual             impairment. 5. Diet and physical activities 6. Evidence for depression or mood disorders  The patients weight, height, BMI and visual acuity have been recorded in the chart I have made referrals, counseling and provided education to the patient based review of the above and I have provided the pt with a written personalized care plan for preventive services.  I have provided you with a copy of your personalized plan for preventive services. Please take the time to review along with your updated medication list.  Will accept prevnar 1 more mammogram in 2 years Will reconsider colon screening in 2019--probably not Discussed picking up pace in walking

## 2016-03-19 NOTE — Assessment & Plan Note (Signed)
BP Readings from Last 3 Encounters:  03/19/16 122/70  02/13/16 110/62  12/22/15 128/78   Good control No change needed

## 2016-03-19 NOTE — Assessment & Plan Note (Signed)
See social history 

## 2016-03-19 NOTE — Assessment & Plan Note (Signed)
Rarely uses the estrogen now

## 2016-03-19 NOTE — Addendum Note (Signed)
Addended by: Pilar Grammes on: 03/19/2016 12:44 PM   Modules accepted: Orders

## 2016-04-01 ENCOUNTER — Encounter: Payer: Self-pay | Admitting: Internal Medicine

## 2016-04-02 NOTE — Telephone Encounter (Signed)
Have you done this refill? Why did it come back to me?

## 2016-04-02 NOTE — Telephone Encounter (Signed)
Okay to do the refill #90 x 0 Check what happened with the April one that seems to have been approved

## 2016-04-02 NOTE — Telephone Encounter (Signed)
Back to you

## 2016-04-02 NOTE — Telephone Encounter (Signed)
Spoke to [place] at the pharmacy. He said she only got #30 on the 10th of April. She has #60 left. I advised him to go ahead and release another #30 for her. I advised the patient on MyChart

## 2016-04-09 ENCOUNTER — Telehealth: Payer: Medicare Other | Admitting: Family

## 2016-04-09 DIAGNOSIS — L237 Allergic contact dermatitis due to plants, except food: Secondary | ICD-10-CM

## 2016-04-09 MED ORDER — PREDNISONE 10 MG (21) PO TBPK: 10.0000 mg | ORAL_TABLET | Freq: Every day | ORAL | Status: DC

## 2016-04-09 NOTE — Progress Notes (Signed)

## 2016-04-10 ENCOUNTER — Ambulatory Visit: Payer: Self-pay | Admitting: Internal Medicine

## 2016-04-10 DIAGNOSIS — L239 Allergic contact dermatitis, unspecified cause: Secondary | ICD-10-CM | POA: Diagnosis not present

## 2016-04-10 DIAGNOSIS — L821 Other seborrheic keratosis: Secondary | ICD-10-CM | POA: Diagnosis not present

## 2016-04-10 DIAGNOSIS — R21 Rash and other nonspecific skin eruption: Secondary | ICD-10-CM | POA: Diagnosis not present

## 2016-04-10 DIAGNOSIS — R208 Other disturbances of skin sensation: Secondary | ICD-10-CM | POA: Diagnosis not present

## 2016-05-03 ENCOUNTER — Telehealth: Payer: Self-pay

## 2016-05-03 NOTE — Telephone Encounter (Signed)
We received a fax from [place] stating Name Brand Zantac 150mg  is on backorder and wanted to know if we would authorize a change to the generic. I called the patient to see what she wanted to do. She said she had plenty of the name brand right now. If the backorder is still in effect when she needs a refill, she may change to the generic.

## 2016-05-22 ENCOUNTER — Other Ambulatory Visit: Payer: Self-pay | Admitting: Internal Medicine

## 2016-05-22 NOTE — Telephone Encounter (Signed)
Rx sent electronically.  

## 2016-08-20 ENCOUNTER — Other Ambulatory Visit: Payer: Self-pay | Admitting: Internal Medicine

## 2016-08-20 MED ORDER — CLORAZEPATE DIPOTASSIUM 7.5 MG PO TABS: 7.5000 mg | ORAL_TABLET | Freq: Every day | ORAL | 0 refills | Status: DC

## 2016-08-20 NOTE — Telephone Encounter (Signed)
Clorazepate last filled 02-20-16 #90 Last OV 03-19-16 Next OV 03-28-17  Forwarding to Dr Danise Mina in Dr Alla German absence. Please send back to [facility] when approved/denied.

## 2016-08-20 NOTE — Telephone Encounter (Signed)
plz phone in. 

## 2016-08-21 NOTE — Telephone Encounter (Signed)
Left refill on voice mail at pharmacy  

## 2016-09-03 ENCOUNTER — Encounter: Payer: Self-pay | Admitting: Internal Medicine

## 2016-09-04 MED ORDER — CLORAZEPATE DIPOTASSIUM 7.5 MG PO TABS: 7.5000 mg | ORAL_TABLET | Freq: Every day | ORAL | 0 refills | Status: DC

## 2016-09-04 NOTE — Telephone Encounter (Signed)
See MyChart Messages: Due to a shortage on Clorazepate, the pt will need a written prescription so she can take it with her when she finds a pharmacy with it in [place]. She is asking for #60. I have printed the prescription for signature.

## 2016-09-04 NOTE — Addendum Note (Signed)
Addended by: Pilar Grammes on: 09/04/2016 11:01 AM   Modules accepted: Orders

## 2016-09-07 ENCOUNTER — Ambulatory Visit (INDEPENDENT_AMBULATORY_CARE_PROVIDER_SITE_OTHER): Payer: Medicare Other

## 2016-09-07 DIAGNOSIS — Z23 Encounter for immunization: Secondary | ICD-10-CM

## 2016-09-19 ENCOUNTER — Ambulatory Visit (INDEPENDENT_AMBULATORY_CARE_PROVIDER_SITE_OTHER): Payer: Medicare Other | Admitting: Family Medicine

## 2016-09-19 ENCOUNTER — Encounter: Payer: Self-pay | Admitting: Family Medicine

## 2016-09-19 VITALS — BP 120/80 | HR 74 | Temp 98.0°F | Wt 151.0 lb

## 2016-09-19 DIAGNOSIS — J069 Acute upper respiratory infection, unspecified: Secondary | ICD-10-CM | POA: Diagnosis not present

## 2016-09-19 MED ORDER — AZITHROMYCIN 250 MG PO TABS: ORAL_TABLET | ORAL | 0 refills | Status: DC

## 2016-09-19 NOTE — Patient Instructions (Signed)
If you are not feeling better in 2-3 days then you can start the [place].  Please let us know if you get worse or have persistent symptoms after taking antibiotic.

## 2016-09-19 NOTE — Progress Notes (Signed)
Subjective:    Patient ID: Dominique Hall, female    DOB: Jan 31, 1935, 81 y.o.   MRN: [id number]  HPI This is a 81 yo female who presents today with sore throat and cough for 2 days. Woke up feeling poorly yesterday and started to feel worse as the day went on. No fever or chills. Clear nasal drainage, some yellow. Horrible cough, non productive. Usually tolerates Zpack with good results. No otc remedies. Not usually bothered by fall allergies. No aches. Some fatigue and SOB with activity.   Past Medical History:  Diagnosis Date  . Allergy   . Arthritis   . Diverticulosis   . Esophageal dysmotility   . Esophageal ring   . GERD (gastroesophageal reflux disease)   . Hiatal hernia   . HTN (hypertension)   . IBS (irritable bowel syndrome)   . Osteopenia   . Renal disorder   . Sleep disturbance    Past Surgical History:  Procedure Laterality Date  . ABDOMINAL HYSTERECTOMY    . APPENDECTOMY  1973  . CHOLECYSTECTOMY    . ESOPHAGOGASTRODUODENOSCOPY  07-2006   with dilation  . [place]  . lumpectomy  [phone]   benign right breast  . SQUAMOUS CELL CARCINOMA EXCISION  6/12   Right leg--Dr Pat Patrick  . SQUAMOUS CELL CARCINOMA EXCISION  2012   right calf  . TOTAL ABDOMINAL HYSTERECTOMY W/ BILATERAL SALPINGOOPHORECTOMY  1973   Family History  Problem Relation Age of Onset  . Stroke Mother   . Heart attack Father    Social History  Substance Use Topics  . Smoking status: Former Research scientist (life sciences)  . Smokeless tobacco: Never Used  . Alcohol use Yes     Comment: occ      Review of Systems Per HPI    Objective:   Physical Exam  Constitutional: She is oriented to person, place, and time. She appears well-developed and well-nourished. No distress.  HENT:  Head: Normocephalic and atraumatic.  Right Ear: Tympanic membrane, external ear and ear canal normal.  Left Ear: Tympanic membrane, external ear and ear canal normal.  Nose: Mucosal edema and rhinorrhea present.    Mouth/Throat: Uvula is midline and mucous membranes are normal. No oropharyngeal exudate, posterior oropharyngeal edema or posterior oropharyngeal erythema.  Cardiovascular: Normal rate, regular rhythm and normal heart sounds.   Pulmonary/Chest: Effort normal and breath sounds normal.  Occasional, non productive cough.   Neurological: She is alert and oriented to person, place, and time.  Skin: Skin is warm and dry. She is not diaphoretic.  Psychiatric: She has a normal mood and affect. Her behavior is normal. Judgment and thought content normal.  Vitals reviewed.     BP 120/80   Pulse 74   Temp 98 F (36.7 C)   Wt 151 lb (68.5 kg)   SpO2 99%   BMI 25.92 kg/m  Wt Readings from Last 3 Encounters:  09/19/16 151 lb (68.5 kg)  03/19/16 148 lb (67.1 kg)  02/13/16 150 lb (68 kg)       Assessment & Plan:  1. Upper respiratory infection with cough and congestion - likely viral, discussed with patient and encouraged symptomatic treatment, provided wait and see antibiotic that she can start if not improved in 5-7 days - increase fluids, rest - azithromycin (ZITHROMAX) 250 MG tablet; Take two tablets today then one a day until finished  Dispense: 6 tablet; Refill: 0   Clarene Reamer, FNP-BC  [facility]  09/19/2016 3:01 PM

## 2016-10-11 ENCOUNTER — Other Ambulatory Visit: Payer: Self-pay | Admitting: Internal Medicine

## 2016-11-19 ENCOUNTER — Other Ambulatory Visit: Payer: Self-pay | Admitting: Internal Medicine

## 2016-11-19 MED ORDER — CLORAZEPATE DIPOTASSIUM 7.5 MG PO TABS: 7.5000 mg | ORAL_TABLET | Freq: Every day | ORAL | 0 refills | Status: DC

## 2016-11-19 NOTE — Telephone Encounter (Signed)
Last filled 09-04-16 #30 Last OV 03-19-16 Next OV 03-28-17  Forwarding to Dr Glori Bickers in Dr Alla German absence

## 2016-11-19 NOTE — Telephone Encounter (Signed)
Px written for call in   Please refill times one in Dr Alla German absence

## 2016-11-19 NOTE — Telephone Encounter (Signed)
Medication phoned to pharmacy.  

## 2016-12-12 ENCOUNTER — Other Ambulatory Visit: Payer: Self-pay | Admitting: Internal Medicine

## 2016-12-12 DIAGNOSIS — Z1231 Encounter for screening mammogram for malignant neoplasm of breast: Secondary | ICD-10-CM

## 2016-12-14 ENCOUNTER — Encounter: Payer: Self-pay | Admitting: Internal Medicine

## 2016-12-14 ENCOUNTER — Ambulatory Visit (INDEPENDENT_AMBULATORY_CARE_PROVIDER_SITE_OTHER): Payer: Medicare Other | Admitting: Internal Medicine

## 2016-12-14 VITALS — BP 130/72 | HR 56 | Temp 97.5°F | Wt 152.0 lb

## 2016-12-14 DIAGNOSIS — M25562 Pain in left knee: Secondary | ICD-10-CM

## 2016-12-14 NOTE — Progress Notes (Signed)
Subjective:    Patient ID: Dominique Hall, female    DOB: 07/27/1935, 82 y.o.   MRN: [id number]  HPI Here due to left knee pain  Started ~ Christmas time but not bad No improvement since then--though not much worse Really hurts if she turns a certain way--like moving onto the brake in car quickly Bad with full flexion--aggravating  Normal walking is not a problem Trouble getting in and out of tub Has to back into driver's side--no problem on passenger side Stairs-- 1 step at a time No weakness--actually leads with left foot going down steps  No apparent swelling No obvious injury No Rx thus far--other than ibuprofen (which helps--400mg  a day)  Current Outpatient Prescriptions on File Prior to Visit  Medication Sig Dispense Refill  . clorazepate (TRANXENE) 7.5 MG tablet Take 1 tablet (7.5 mg total) by mouth at bedtime. 30 tablet 0  . estradiol (ESTRACE) 0.1 MG/GM vaginal cream Place 1 Applicatorful vaginally 3 (three) times a week. 42.5 g 11  . hydrochlorothiazide (MICROZIDE) 12.5 MG capsule TAKE 1 CAPSULE EVERY       MORNING 90 capsule 3  . Multiple Vitamin (MULTIVITAMIN) tablet Take 1 tablet by mouth daily.    Marland Kitchen NEXIUM 40 MG capsule TAKE 1 CAPSULE DAILY 90 capsule 1  . Omega-3 Fatty Acids (FISH OIL PO) Take 1 capsule by mouth daily.    Marland Kitchen ZANTAC 150 MG tablet Take 1 tablet (150 mg total) by mouth at bedtime. 90 tablet 3   No current facility-administered medications on file prior to visit.     Allergies  Allergen Reactions  . Penicillins     REACTION: unspecified  . Rabeprazole Sodium     REACTION: constipated  . Sulfonamide Derivatives     REACTION: unspecified  . Cefuroxime     diarrhea    Past Medical History:  Diagnosis Date  . Allergy   . Arthritis   . Diverticulosis   . Esophageal dysmotility   . Esophageal ring   . GERD (gastroesophageal reflux disease)   . Hiatal hernia   . HTN (hypertension)   . IBS (irritable bowel syndrome)   . Osteopenia   .  Renal disorder   . Sleep disturbance     Past Surgical History:  Procedure Laterality Date  . ABDOMINAL HYSTERECTOMY    . APPENDECTOMY  1973  . CHOLECYSTECTOMY    . ESOPHAGOGASTRODUODENOSCOPY  07-2006   with dilation  . [place]  . lumpectomy  [phone]   benign right breast  . SQUAMOUS CELL CARCINOMA EXCISION  6/12   Right leg--Dr Pat Patrick  . SQUAMOUS CELL CARCINOMA EXCISION  2012   right calf  . TOTAL ABDOMINAL HYSTERECTOMY W/ BILATERAL SALPINGOOPHORECTOMY  1973    Family History  Problem Relation Age of Onset  . Stroke Mother   . Heart attack Father     Social History   Social History  . Marital status: Married    Spouse name: N/A  . Number of children: 2  . Years of education: N/A   Occupational History  . consignment shop    Social History Main Topics  . Smoking status: Former Research scientist (life sciences)  . Smokeless tobacco: Never Used  . Alcohol use Yes     Comment: occ  . Drug use: No  . Sexual activity: Not on file   Other Topics Concern  . Not on file   Social History Narrative   Has living will   Husband is [place] health care  POA   Would accept resuscitation but no prolonged ventilation   Probably would not want tube feedings if cognitively unaware   Review of Systems  Not sick No fevers     Objective:   Physical Exam  Musculoskeletal:  Left knee--- not swollen No ligament instability MacMurray's positive with lateral stress  Neurological:  Mildly antalgic gait No weakness in legs          Assessment & Plan:

## 2016-12-14 NOTE — Progress Notes (Signed)
Pre visit review using our clinic review tool, if applicable. No additional management support is needed unless otherwise documented below in the visit note. 

## 2016-12-14 NOTE — Patient Instructions (Signed)
I think you have a mild meniscus tear in your knee. Let me know if you want to see Dr Marry Guan about this--I will set it up.

## 2016-12-14 NOTE — Assessment & Plan Note (Signed)
History and exam consistent with mild meniscus injury She is not ready to consider surgery yet Discussed occasional ibuprofen May feel better with sleeve brace Will set up with Dr Marry Guan (her choice) if worsens

## 2016-12-17 ENCOUNTER — Encounter: Payer: Self-pay | Admitting: Internal Medicine

## 2016-12-17 DIAGNOSIS — M23209 Derangement of unspecified meniscus due to old tear or injury, unspecified knee: Secondary | ICD-10-CM

## 2016-12-19 ENCOUNTER — Encounter: Payer: Self-pay | Admitting: Internal Medicine

## 2016-12-19 NOTE — Telephone Encounter (Signed)
Please see if you can get her in with a different ortho sooner

## 2016-12-20 NOTE — Telephone Encounter (Signed)
Great, thanks

## 2016-12-24 DIAGNOSIS — G8929 Other chronic pain: Secondary | ICD-10-CM | POA: Diagnosis not present

## 2016-12-24 DIAGNOSIS — M7122 Synovial cyst of popliteal space [Baker], left knee: Secondary | ICD-10-CM | POA: Diagnosis not present

## 2016-12-24 DIAGNOSIS — M25562 Pain in left knee: Secondary | ICD-10-CM | POA: Diagnosis not present

## 2016-12-25 ENCOUNTER — Encounter: Payer: Self-pay | Admitting: Internal Medicine

## 2017-01-24 ENCOUNTER — Ambulatory Visit
Admission: RE | Admit: 2017-01-24 | Discharge: 2017-01-24 | Disposition: A | Discharge status: Home / Self Care | Payer: Medicare Other | Source: Ambulatory Visit | Attending: Internal Medicine | Admitting: Internal Medicine

## 2017-01-24 DIAGNOSIS — Z1231 Encounter for screening mammogram for malignant neoplasm of breast: Secondary | ICD-10-CM | POA: Diagnosis not present

## 2017-02-18 DIAGNOSIS — L853 Xerosis cutis: Secondary | ICD-10-CM | POA: Diagnosis not present

## 2017-02-18 DIAGNOSIS — L821 Other seborrheic keratosis: Secondary | ICD-10-CM | POA: Diagnosis not present

## 2017-02-18 DIAGNOSIS — L820 Inflamed seborrheic keratosis: Secondary | ICD-10-CM | POA: Diagnosis not present

## 2017-03-07 DIAGNOSIS — H2513 Age-related nuclear cataract, bilateral: Secondary | ICD-10-CM | POA: Diagnosis not present

## 2017-03-19 DIAGNOSIS — J019 Acute sinusitis, unspecified: Secondary | ICD-10-CM | POA: Diagnosis not present

## 2017-03-19 DIAGNOSIS — J3089 Other allergic rhinitis: Secondary | ICD-10-CM | POA: Diagnosis not present

## 2017-03-28 ENCOUNTER — Ambulatory Visit (INDEPENDENT_AMBULATORY_CARE_PROVIDER_SITE_OTHER): Payer: Medicare Other | Admitting: Internal Medicine

## 2017-03-28 ENCOUNTER — Encounter: Payer: Self-pay | Admitting: Internal Medicine

## 2017-03-28 VITALS — BP 122/84 | HR 59 | Temp 97.7°F | Ht 64.0 in | Wt 152.0 lb

## 2017-03-28 DIAGNOSIS — K219 Gastro-esophageal reflux disease without esophagitis: Secondary | ICD-10-CM

## 2017-03-28 DIAGNOSIS — I10 Essential (primary) hypertension: Secondary | ICD-10-CM

## 2017-03-28 DIAGNOSIS — F39 Unspecified mood [affective] disorder: Secondary | ICD-10-CM

## 2017-03-28 DIAGNOSIS — M25562 Pain in left knee: Secondary | ICD-10-CM | POA: Diagnosis not present

## 2017-03-28 DIAGNOSIS — Z0000 Encounter for general adult medical examination without abnormal findings: Secondary | ICD-10-CM | POA: Diagnosis not present

## 2017-03-28 DIAGNOSIS — N952 Postmenopausal atrophic vaginitis: Secondary | ICD-10-CM

## 2017-03-28 MED ORDER — ESTRADIOL 0.1 MG/GM VA CREA: 1.0000 | TOPICAL_CREAM | VAGINAL | 1 refills | Status: DC

## 2017-03-28 MED ORDER — CLORAZEPATE DIPOTASSIUM 7.5 MG PO TABS: 7.5000 mg | ORAL_TABLET | Freq: Every day | ORAL | 0 refills | Status: DC

## 2017-03-28 MED ORDER — MELOXICAM 7.5 MG PO TABS: 7.5000 mg | ORAL_TABLET | Freq: Every day | ORAL | 1 refills | Status: DC

## 2017-03-28 NOTE — Assessment & Plan Note (Signed)
BP Readings from Last 3 Encounters:  03/28/17 122/84  12/14/16 130/72  09/19/16 120/80   Good control Due for labs

## 2017-03-28 NOTE — Assessment & Plan Note (Signed)
Will use the meloxicam prn only

## 2017-03-28 NOTE — Assessment & Plan Note (Signed)
Trying to take the PPI less---this is fine as long as symptoms are controlled

## 2017-03-28 NOTE — Assessment & Plan Note (Signed)
I have personally reviewed the Medicare Annual Wellness questionnaire and have noted 1. The patient's medical and social history 2. Their use of alcohol, tobacco or illicit drugs 3. Their current medications and supplements 4. The patient's functional ability including ADL's, fall risks, home safety risks and hearing or visual             impairment. 5. Diet and physical activities 6. Evidence for depression or mood disorders  The patients weight, height, BMI and visual acuity have been recorded in the chart I have made referrals, counseling and provided education to the patient based review of the above and I have provided the pt with a written personalized care plan for preventive services.  I have provided you with a copy of your personalized plan for preventive services. Please take the time to review along with your updated medication list.  No cancer screening due to age Yearly flu vaccine Prefers no tetanus Walks regularly

## 2017-03-28 NOTE — Patient Instructions (Signed)
Please set up an appointment with [facility] to evaluate your hearing.

## 2017-03-28 NOTE — Progress Notes (Signed)
Subjective:    Patient ID: Dominique Hall, female    DOB: November 16, 1934, 82 y.o.   MRN: [id number]  HPI Here for Medicare wellness visit and follow up of chronic health conditions Reviewed form and advanced directives Reviewed other doctors--Dr Norman Herrlich (dentist), Dr Thomasene Ripple (ophtho), Dr Nehemiah Massed (derm) Still enjoys a glass of wine nightly No tobacco Tries to walk regularly No falls Independent with instrumental ADLs No sig memory problems Vision is fine  Having hearing problems Has had decline lately Considering hearing aides  She is concerned about the PPI daily She is now taking it every other day--- ranitidine one day and nexium the other No heartburn or dysphagia on this regimen Has had some "spells" of burning feeling from right neck all the way down to abdomen--didn't know if this was acid related Didn't tolerate going off the PPI for a week not too long ago  Has had some panic feelings Uses the clorazepate prn No depression or anhedonia  Still uses the estrogen--but very rarely Probably every 2 weeks--due to excessive dryness  Ran out of the meloxicam--got from [employer] Had Baker's cyst meloxicam really calmed it down  No chest pain No SOB No dizziness or sycnope Trace edema only Rare palpitation--like if climbing steps  Current Outpatient Prescriptions on File Prior to Visit  Medication Sig Dispense Refill  . clorazepate (TRANXENE) 7.5 MG tablet Take 1 tablet (7.5 mg total) by mouth at bedtime. 30 tablet 0  . estradiol (ESTRACE) 0.1 MG/GM vaginal cream Place 1 Applicatorful vaginally 3 (three) times a week. 42.5 g 11  . hydrochlorothiazide (MICROZIDE) 12.5 MG capsule TAKE 1 CAPSULE EVERY       MORNING 90 capsule 3  . Multiple Vitamin (MULTIVITAMIN) tablet Take 1 tablet by mouth daily.    Marland Kitchen NEXIUM 40 MG capsule TAKE 1 CAPSULE DAILY 90 capsule 1  . Omega-3 Fatty Acids (FISH OIL PO) Take 1 capsule by mouth daily.    Marland Kitchen ZANTAC 150 MG tablet Take 1 tablet  (150 mg total) by mouth at bedtime. 90 tablet 3   No current facility-administered medications on file prior to visit.     Allergies  Allergen Reactions  . Penicillins     REACTION: unspecified  . Rabeprazole Sodium     REACTION: constipated  . Sulfonamide Derivatives     REACTION: unspecified  . Cefuroxime     diarrhea    Past Medical History:  Diagnosis Date  . Allergy   . Arthritis   . Diverticulosis   . Esophageal dysmotility   . Esophageal ring   . GERD (gastroesophageal reflux disease)   . Hiatal hernia   . HTN (hypertension)   . IBS (irritable bowel syndrome)   . Osteopenia   . Renal disorder   . Sleep disturbance     Past Surgical History:  Procedure Laterality Date  . ABDOMINAL HYSTERECTOMY    . APPENDECTOMY  1973  . BREAST EXCISIONAL BIOPSY Right   . CHOLECYSTECTOMY    . ESOPHAGOGASTRODUODENOSCOPY  07-2006   with dilation  . [place]  . lumpectomy  [phone]   benign right breast  . SQUAMOUS CELL CARCINOMA EXCISION  6/12   Right leg--Dr Pat Patrick  . SQUAMOUS CELL CARCINOMA EXCISION  2012   right calf  . TOTAL ABDOMINAL HYSTERECTOMY W/ BILATERAL SALPINGOOPHORECTOMY  1973    Family History  Problem Relation Age of Onset  . Heart attack Father   . Stroke Mother     Social History  Social History  . Marital status: Married    Spouse name: N/A  . Number of children: 2  . Years of education: N/A   Occupational History  . consignment shop    Social History Main Topics  . Smoking status: Former Research scientist (life sciences)  . Smokeless tobacco: Never Used  . Alcohol use Yes     Comment: occ  . Drug use: No  . Sexual activity: Not on file   Other Topics Concern  . Not on file   Social History Narrative   Has living will   Husband is [place] health care POA   Would accept resuscitation but no prolonged ventilation   Probably would not want tube feedings if cognitively unaware   Review of Systems No headaches or very rare Appetite is good Weight is  stable Wears seat belt Teeth are fine--recent exam Skin is okay--sees derm regularly Bowels are slow--but nothing persistent. No blood Voids okay. No incontinence No other significant joint pains    Objective:   Physical Exam  Constitutional: She is oriented to person, place, and time. She appears well-nourished. No distress.  HENT:  Mouth/Throat: Oropharynx is clear and moist. No oropharyngeal exudate.  Neck: No thyromegaly present.  Cardiovascular: Normal rate, regular rhythm, normal heart sounds and intact distal pulses.  Exam reveals no gallop.   No murmur heard. Pulmonary/Chest: Effort normal and breath sounds normal. No respiratory distress. She has no wheezes. She has no rales.  Abdominal: Soft. There is no tenderness.  Musculoskeletal: She exhibits no edema or tenderness.  Lymphadenopathy:    She has no cervical adenopathy.  Neurological: She is alert and oriented to person, place, and time.  President--- "Milinda Pointer, ?, Bush, Clinton" [id number] [id number] Recall 3/3  Skin: No rash noted. No erythema.  Psychiatric: She has a normal mood and affect. Her behavior is normal.          Assessment & Plan:

## 2017-03-28 NOTE — Assessment & Plan Note (Signed)
Uses the estrogen cream about twice a month

## 2017-03-28 NOTE — Assessment & Plan Note (Signed)
Anxiety at times Has the prn clorazepate

## 2017-03-29 LAB — CBC WITH DIFFERENTIAL/PLATELET
BASOS PCT: 0.3 % (ref 0.0–3.0)
Basophils Absolute: 0 10*3/uL (ref 0.0–0.1)
EOS PCT: 4.4 % (ref 0.0–5.0)
Eosinophils Absolute: 0.2 10*3/uL (ref 0.0–0.7)
HEMATOCRIT: 37.4 % (ref 36.0–46.0)
HEMOGLOBIN: 12.9 g/dL (ref 12.0–15.0)
LYMPHS PCT: 19.6 % (ref 12.0–46.0)
Lymphs Abs: 1.1 10*3/uL (ref 0.7–4.0)
MCHC: 34.4 g/dL (ref 30.0–36.0)
MCV: 84.6 fl (ref 78.0–100.0)
MONO ABS: 0.4 10*3/uL (ref 0.1–1.0)
Monocytes Relative: 6.8 % (ref 3.0–12.0)
Neutro Abs: 3.9 10*3/uL (ref 1.4–7.7)
Neutrophils Relative %: 68.9 % (ref 43.0–77.0)
Platelets: 189 10*3/uL (ref 150.0–400.0)
RBC: 4.42 Mil/uL (ref 3.87–5.11)
RDW: 12.7 % (ref 11.5–15.5)
WBC: 5.6 10*3/uL (ref 4.0–10.5)

## 2017-03-29 LAB — COMPREHENSIVE METABOLIC PANEL
ALT: 15 U/L (ref 0–35)
AST: 18 U/L (ref 0–37)
Albumin: 4.5 g/dL (ref 3.5–5.2)
Alkaline Phosphatase: 62 U/L (ref 39–117)
BUN: 22 mg/dL (ref 6–23)
CALCIUM: 10 mg/dL (ref 8.4–10.5)
CO2: 30 meq/L (ref 19–32)
Chloride: 104 mEq/L (ref 96–112)
Creatinine, Ser: 0.77 mg/dL (ref 0.40–1.20)
GFR: 76.66 mL/min (ref >60.00)
GLUCOSE: 86 mg/dL (ref 70–99)
Potassium: 3.7 mEq/L (ref 3.5–5.1)
Sodium: 141 mEq/L (ref 135–145)
TOTAL PROTEIN: 6.9 g/dL (ref 6.0–8.3)
Total Bilirubin: 0.8 mg/dL (ref 0.2–1.2)

## 2017-04-01 ENCOUNTER — Encounter: Payer: Self-pay | Admitting: *Deleted

## 2017-04-30 ENCOUNTER — Encounter: Payer: Self-pay | Admitting: Internal Medicine

## 2017-05-01 ENCOUNTER — Other Ambulatory Visit: Payer: Self-pay | Admitting: Internal Medicine

## 2017-05-01 MED ORDER — ESOMEPRAZOLE MAGNESIUM 40 MG PO CPDR: 40.0000 mg | DELAYED_RELEASE_CAPSULE | Freq: Every day | ORAL | 3 refills | Status: DC

## 2017-05-01 MED ORDER — CLORAZEPATE DIPOTASSIUM 7.5 MG PO TABS: ORAL_TABLET | ORAL | 0 refills | Status: DC

## 2017-05-01 NOTE — Telephone Encounter (Signed)
Left refill on voice mail at pharmacy  

## 2017-05-01 NOTE — Telephone Encounter (Signed)
Approved: #60 x 0 if they will approve. Likely would need to put 1-2 at bedtime and tell her to still just take 1

## 2017-05-01 NOTE — Telephone Encounter (Signed)
Clorazepate last filled 03-28-17 #30. Pt is asking for #60 to save money.

## 2017-05-06 ENCOUNTER — Other Ambulatory Visit: Payer: Self-pay | Admitting: Internal Medicine

## 2017-05-21 ENCOUNTER — Other Ambulatory Visit: Payer: Self-pay | Admitting: Internal Medicine

## 2017-06-25 DIAGNOSIS — H2513 Age-related nuclear cataract, bilateral: Secondary | ICD-10-CM | POA: Diagnosis not present

## 2017-07-01 ENCOUNTER — Other Ambulatory Visit: Payer: Self-pay

## 2017-07-01 MED ORDER — NEXIUM 40 MG PO CPDR: 40.0000 mg | DELAYED_RELEASE_CAPSULE | Freq: Every day | ORAL | 3 refills | Status: DC

## 2017-07-01 NOTE — Telephone Encounter (Signed)
Dominique Hall needs a new prescription for Nexium 40MG  and it needs to have "dispense as written" added to the script in order for me to get the "real Nexium" and not the generic. If this is not on the script, I will not only get the generic but it will not be cost effective for insurance purposes. Any questions, we can be reached at [phone] (home) or [phone] (cell)!

## 2017-07-01 NOTE — Telephone Encounter (Signed)
Rx sent electronically.  

## 2017-08-12 ENCOUNTER — Encounter: Payer: Self-pay | Admitting: Internal Medicine

## 2017-08-15 ENCOUNTER — Ambulatory Visit (INDEPENDENT_AMBULATORY_CARE_PROVIDER_SITE_OTHER): Payer: Medicare Other

## 2017-08-15 DIAGNOSIS — Z23 Encounter for immunization: Secondary | ICD-10-CM | POA: Diagnosis not present

## 2017-08-27 DIAGNOSIS — H903 Sensorineural hearing loss, bilateral: Secondary | ICD-10-CM | POA: Diagnosis not present

## 2017-09-06 ENCOUNTER — Ambulatory Visit (INDEPENDENT_AMBULATORY_CARE_PROVIDER_SITE_OTHER): Payer: Medicare Other | Admitting: Family Medicine

## 2017-09-06 ENCOUNTER — Encounter: Payer: Self-pay | Admitting: Family Medicine

## 2017-09-06 VITALS — BP 136/72 | HR 86 | Temp 98.6°F | Wt 154.5 lb

## 2017-09-06 DIAGNOSIS — N3090 Cystitis, unspecified without hematuria: Secondary | ICD-10-CM | POA: Diagnosis not present

## 2017-09-06 DIAGNOSIS — R1033 Periumbilical pain: Secondary | ICD-10-CM

## 2017-09-06 DIAGNOSIS — R110 Nausea: Secondary | ICD-10-CM | POA: Diagnosis not present

## 2017-09-06 DIAGNOSIS — G4700 Insomnia, unspecified: Secondary | ICD-10-CM

## 2017-09-06 LAB — POCT URINALYSIS DIPSTICK
Bilirubin, UA: NEGATIVE
Glucose, UA: NEGATIVE
Ketones, UA: NEGATIVE
Nitrite, UA: NEGATIVE
PROTEIN UA: NEGATIVE
RBC UA: 10
SPEC GRAV UA: 1.015 (ref 1.010–1.025)
UROBILINOGEN UA: 0.2 U/dL
pH, UA: 6 (ref 5.0–8.0)

## 2017-09-06 MED ORDER — CEPHALEXIN 500 MG PO CAPS: 500.0000 mg | ORAL_CAPSULE | Freq: Two times a day (BID) | ORAL | 0 refills | Status: DC

## 2017-09-06 MED ORDER — ZOFRAN 4 MG PO TABS: 4.0000 mg | ORAL_TABLET | Freq: Three times a day (TID) | ORAL | 0 refills | Status: DC | PRN

## 2017-09-06 MED ORDER — CLORAZEPATE DIPOTASSIUM 7.5 MG PO TABS: ORAL_TABLET | ORAL | 0 refills | Status: DC

## 2017-09-06 NOTE — Progress Notes (Signed)
[id number]

## 2017-09-06 NOTE — Progress Notes (Signed)
Subjective:    Patient ID: Dominique Hall, female    DOB: 31-Jul-1935, 82 y.o.   MRN: [id number]  HPI This is an 82 yo female, accompanied by her husband. She woke up in the middle of the night with severe mid abdominal pain. Used a heating pad with good relief. Has felt nauseous but has been able to eat and drink without difficulty. No pain now. Pain was stabbing. Chronic constipation, has had no BM for 3 days. Does not take anything for constipation.  No dysuria, no frequency. Felt warm during episode of pain.  She requests some Zofran to have on hand as well as refill of her tranxene.   Past Medical History:  Diagnosis Date  . Allergy   . Arthritis   . Diverticulosis   . Esophageal dysmotility   . Esophageal ring   . GERD (gastroesophageal reflux disease)   . Hiatal hernia   . HTN (hypertension)   . IBS (irritable bowel syndrome)   . Osteopenia   . Renal disorder   . Sleep disturbance    Past Surgical History:  Procedure Laterality Date  . ABDOMINAL HYSTERECTOMY    . APPENDECTOMY  1973  . BREAST EXCISIONAL BIOPSY Right   . CHOLECYSTECTOMY    . ESOPHAGOGASTRODUODENOSCOPY  07-2006   with dilation  . [place]  . lumpectomy  [phone]   benign right breast  . SQUAMOUS CELL CARCINOMA EXCISION  6/12   Right leg--Dr Pat Patrick  . SQUAMOUS CELL CARCINOMA EXCISION  2012   right calf  . TOTAL ABDOMINAL HYSTERECTOMY W/ BILATERAL SALPINGOOPHORECTOMY  1973   Family History  Problem Relation Age of Onset  . Heart attack Father   . Stroke Mother   . Melanoma Daughter    Social History  Substance Use Topics  . Smoking status: Former Research scientist (life sciences)  . Smokeless tobacco: Never Used  . Alcohol use Yes     Comment: occ      Review of Systems Per HPI    Objective:   Physical Exam  Constitutional: She is oriented to person, place, and time. She appears well-developed and well-nourished. No distress.  HENT:  Head: Normocephalic and atraumatic.  Eyes: Conjunctivae are normal.   Cardiovascular: Normal rate, regular rhythm and normal heart sounds.   Pulmonary/Chest: Effort normal and breath sounds normal.  Abdominal: Soft. Bowel sounds are normal. She exhibits no distension. There is no tenderness. There is no rebound, no guarding and no CVA tenderness.  Musculoskeletal: She exhibits no edema.  Neurological: She is alert and oriented to person, place, and time.  Skin: Skin is warm and dry. She is not diaphoretic.  Psychiatric: She has a normal mood and affect. Her behavior is normal. Judgment and thought content normal.  Vitals reviewed.     BP 136/72 (BP Location: Right Arm, Patient Position: Sitting, Cuff Size: Normal)   Pulse 86   Temp 98.6 F (37 C) (Oral)   Wt 154 lb 8 oz (70.1 kg)   SpO2 94%   BMI 26.52 kg/m  Wt Readings from Last 3 Encounters:  09/06/17 154 lb 8 oz (70.1 kg)  03/28/17 152 lb (68.9 kg)  12/14/16 152 lb (68.9 kg)   Results for orders placed or performed in visit on 09/06/17  POCT urinalysis dipstick  Result Value Ref Range   Color, UA yellow    Clarity, UA clear    Glucose, UA neg    Bilirubin, UA neg    Ketones, UA neg  Spec Grav, UA 1.015 1.010 - 1.025   Blood, UA 10    pH, UA 6.0 5.0 - 8.0   Protein, UA neg    Urobilinogen, UA 0.2 0.2 or 1.0 E.U./dL   Nitrite, UA neg    Leukocytes, UA Moderate (2+) (A) Negative       Assessment & Plan:  1. Periumbilical abdominal pain - resolved spontaneously - POCT urinalysis dipstick  2. Cystitis - urine with 2+ leukocytes, will go ahead and treat pending culture results - Urine Culture - cephALEXin (KEFLEX) 500 MG capsule; Take 1 capsule (500 mg total) by mouth 2 (two) times daily.  Dispense: 14 capsule; Refill: 0  3. Nausea - ZOFRAN 4 MG tablet; Take 1 tablet (4 mg total) by mouth every 8 (eight) hours as needed for nausea or vomiting.  Dispense: 20 tablet; Refill: 0- discussed possible worsening of constipation with use  4. Insomnia, unspecified type - clorazepate  (TRANXENE) 7.5 MG tablet; Take 1 to 2 tablets at bedtime as needed.  Dispense: 60 tablet; Refill: 0  5. Constipation - encouraged increased fluids, Miralax prn  Clarene Reamer, FNP-BC   [facility] at [facility], [place]  09/10/2017 8:42 AM

## 2017-09-06 NOTE — Patient Instructions (Addendum)
I will notify you of culture results next week  Urinary Tract Infection, Adult A urinary tract infection (UTI) is an infection of any part of the urinary tract, which includes the kidneys, ureters, bladder, and urethra. These organs make, store, and get rid of urine in the body. UTI can be a bladder infection (cystitis) or kidney infection (pyelonephritis). What are the causes? This infection may be caused by fungi, viruses, or bacteria. Bacteria are the most common cause of UTIs. This condition can also be caused by repeated incomplete emptying of the bladder during urination. What increases the risk? This condition is more likely to develop if:  You ignore your need to urinate or hold urine for long periods of time.  You do not empty your bladder completely during urination.  You wipe back to front after urinating or having a bowel movement, if you are female.  You are uncircumcised, if you are female.  You are constipated.  You have a urinary catheter that stays in place (indwelling).  You have a weak defense (immune) system.  You have a medical condition that affects your bowels, kidneys, or bladder.  You have diabetes.  You take antibiotic medicines frequently or for long periods of time, and the antibiotics no longer work well against certain types of infections (antibiotic resistance).  You take medicines that irritate your urinary tract.  You are exposed to chemicals that irritate your urinary tract.  You are female.  What are the signs or symptoms? Symptoms of this condition include:  Fever.  Frequent urination or passing small amounts of urine frequently.  Needing to urinate urgently.  Pain or burning with urination.  Urine that smells bad or unusual.  Cloudy urine.  Pain in the lower abdomen or back.  Trouble urinating.  Blood in the urine.  Vomiting or being less hungry than normal.  Diarrhea or abdominal pain.  Vaginal discharge, if you are  female.  How is this diagnosed? This condition is diagnosed with a medical history and physical exam. You will also need to provide a urine sample to test your urine. Other tests may be done, including:  Blood tests.  Sexually transmitted disease (STD) testing.  If you have had more than one UTI, a cystoscopy or imaging studies may be done to determine the cause of the infections. How is this treated? Treatment for this condition often includes a combination of two or more of the following:  Antibiotic medicine.  Other medicines to treat less common causes of UTI.  Over-the-counter medicines to treat pain.  Drinking enough water to stay hydrated.  Follow these instructions at home:  Take over-the-counter and prescription medicines only as told by your health care provider.  If you were prescribed an antibiotic, take it as told by your health care provider. Do not stop taking the antibiotic even if you start to feel better.  Avoid alcohol, caffeine, tea, and carbonated beverages. They can irritate your bladder.  Drink enough fluid to keep your urine clear or pale yellow.  Keep all follow-up visits as told by your health care provider. This is important.  Make sure to: ? Empty your bladder often and completely. Do not hold urine for long periods of time. ? Empty your bladder before and after sex. ? Wipe from front to back after a bowel movement if you are female. Use each tissue one time when you wipe. Contact a health care provider if:  You have back pain.  You have a fever.  You feel nauseous or vomit.  Your symptoms do not get better after 3 days.  Your symptoms go away and then return. Get help right away if:  You have severe back pain or lower abdominal pain.  You are vomiting and cannot keep down any medicines or water. This information is not intended to replace advice given to you by your health care provider. Make sure you discuss any questions you have  with your health care provider. Document Released: 08/08/2005 Document Revised: 04/11/2016 Document Reviewed: 09/19/2015 Elsevier Interactive Patient Education  2017 [employer].

## 2017-09-07 LAB — URINE CULTURE
MICRO NUMBER: 81202748
SPECIMEN QUALITY:: ADEQUATE

## 2017-09-30 ENCOUNTER — Ambulatory Visit (INDEPENDENT_AMBULATORY_CARE_PROVIDER_SITE_OTHER): Payer: Medicare Other | Admitting: Internal Medicine

## 2017-09-30 ENCOUNTER — Encounter: Payer: Self-pay | Admitting: Internal Medicine

## 2017-09-30 VITALS — BP 118/80 | HR 66 | Temp 98.2°F | Wt 154.0 lb

## 2017-09-30 DIAGNOSIS — S51011A Laceration without foreign body of right elbow, initial encounter: Secondary | ICD-10-CM

## 2017-09-30 DIAGNOSIS — S51019A Laceration without foreign body of unspecified elbow, initial encounter: Secondary | ICD-10-CM | POA: Insufficient documentation

## 2017-09-30 IMAGING — DX DG CHEST 2V
2 series · 2 of 2 positions shown · non-contrast
Comparison: PA and lateral chest x-ray November 09, 2015

CLINICAL DATA: Follow-up of recent abnormal chest x-ray; currently
asymptomatic; history of asthma and COPD, previous smoker.

EXAM:
CHEST  2 VIEW

[chest pa]
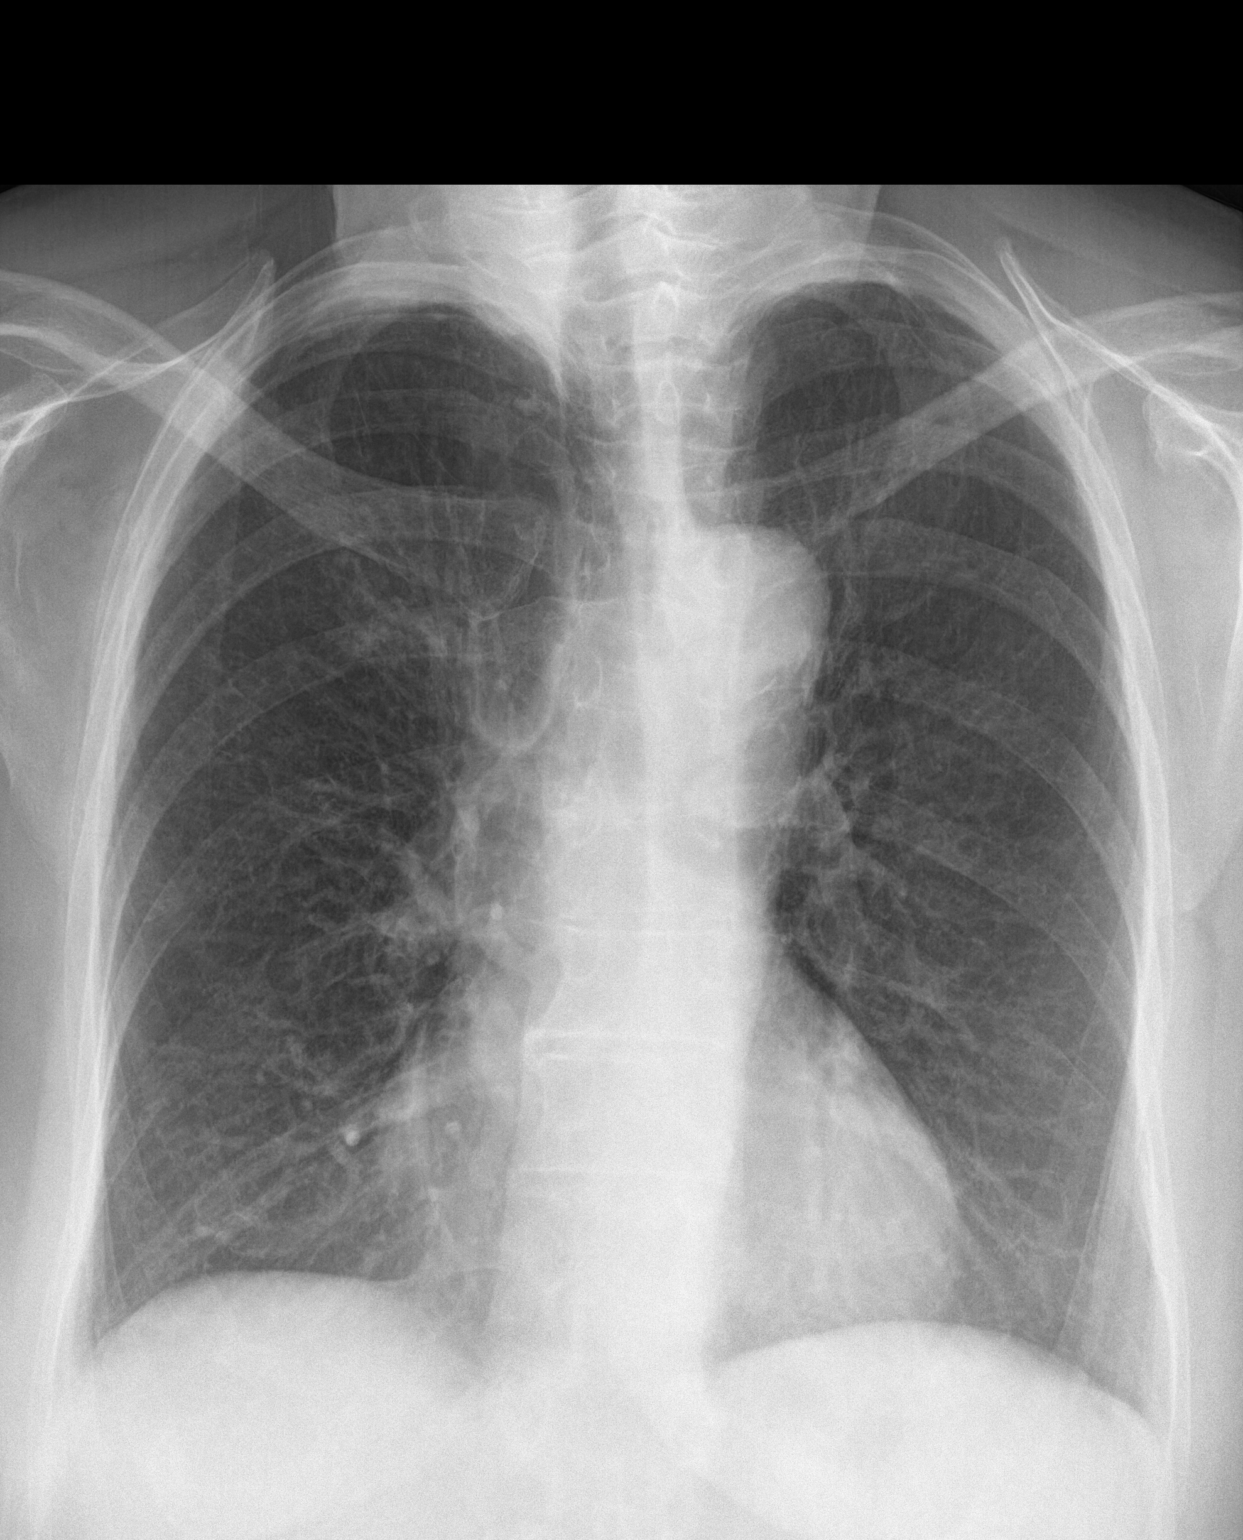

[chest lat]
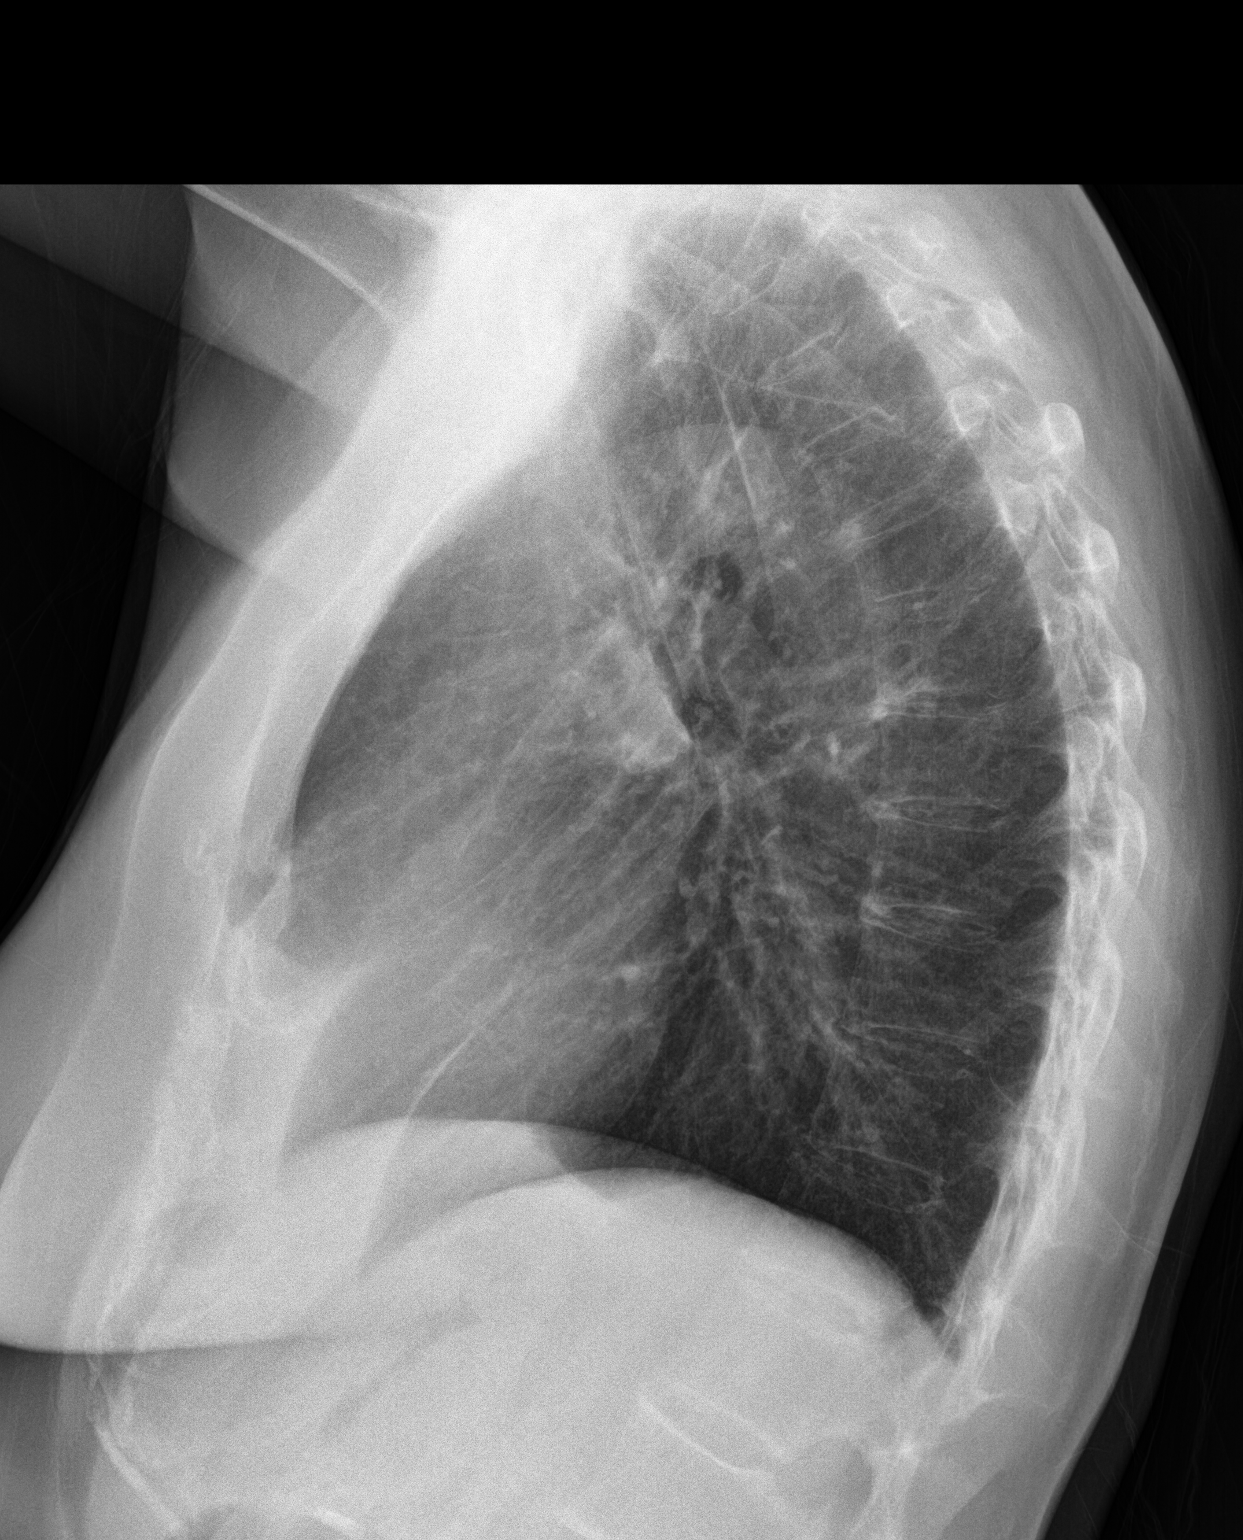

[2 of 2 positions shown; findings below may reference images not displayed]

FINDINGS: The lungs are mildly hyperinflated and clear. Patchy bibasilar
infiltrates have resolved. There is stable biapical pleural
thickening. The heart and pulmonary vascularity are normal. The
mediastinum is normal in width. The bony thorax exhibits no acute
abnormality.
IMPRESSION: Interval resolution of pneumonia.  Stable findings of COPD.

## 2017-09-30 NOTE — Progress Notes (Signed)
Subjective:    Patient ID: Dominique Hall, female    DOB: 12/14/1934, 82 y.o.   MRN: [id number]  HPI Here with husband---scraped right elbow last week  Lost power a week ago Has 6 steps out--- missed last step on the way out Noonday onto the concrete Hit right elbow and skinned it  Just put polysporin and bandaid since then No other Rx  Has had some bleeding No pain  Current Outpatient Medications on File Prior to Visit  Medication Sig Dispense Refill  . clorazepate (TRANXENE) 7.5 MG tablet Take 1 to 2 tablets at bedtime as needed. 60 tablet 0  . estradiol (ESTRACE) 0.1 MG/GM vaginal cream Place 1 Applicatorful vaginally 3 (three) times a week. 42.5 g 1  . hydrochlorothiazide (MICROZIDE) 12.5 MG capsule TAKE 1 CAPSULE EVERY       MORNING 90 capsule 3  . meloxicam (MOBIC) 7.5 MG tablet TAKE 1 TABLET BY MOUTH EVERY DAY 30 tablet 5  . Multiple Vitamin (MULTIVITAMIN) tablet Take 1 tablet by mouth daily.    Marland Kitchen NEXIUM 40 MG capsule Take 1 capsule (40 mg total) by mouth daily. 90 capsule 3  . Omega-3 Fatty Acids (FISH OIL PO) Take 1 capsule by mouth daily.    Marland Kitchen ZANTAC 150 MG tablet Take 1 tablet (150 mg total) by mouth at bedtime. 90 tablet 3  . ZOFRAN 4 MG tablet Take 1 tablet (4 mg total) by mouth every 8 (eight) hours as needed for nausea or vomiting. 20 tablet 0   No current facility-administered medications on file prior to visit.     Allergies  Allergen Reactions  . Penicillins     REACTION: unspecified  . Rabeprazole Sodium     REACTION: constipated  . Sulfonamide Derivatives     REACTION: unspecified  . Cefuroxime     diarrhea    Past Medical History:  Diagnosis Date  . Allergy   . Arthritis   . Diverticulosis   . Esophageal dysmotility   . Esophageal ring   . GERD (gastroesophageal reflux disease)   . Hiatal hernia   . HTN (hypertension)   . IBS (irritable bowel syndrome)   . Osteopenia   . Renal disorder   . Sleep disturbance     Past Surgical History:    Procedure Laterality Date  . ABDOMINAL HYSTERECTOMY    . APPENDECTOMY  1973  . BREAST EXCISIONAL BIOPSY Right   . CHOLECYSTECTOMY    . ESOPHAGOGASTRODUODENOSCOPY  07-2006   with dilation  . [place]  . lumpectomy  [phone]   benign right breast  . SQUAMOUS CELL CARCINOMA EXCISION  6/12   Right leg--Dr Pat Patrick  . SQUAMOUS CELL CARCINOMA EXCISION  2012   right calf  . TOTAL ABDOMINAL HYSTERECTOMY W/ BILATERAL SALPINGOOPHORECTOMY  1973    Family History  Problem Relation Age of Onset  . Heart attack Father   . Stroke Mother   . Melanoma Daughter     Social History   Socioeconomic History  . Marital status: Married    Spouse name: Not on file  . Number of children: 2  . Years of education: Not on file  . Highest education level: Not on file  Social Needs  . Financial resource strain: Not on file  . Food insecurity - worry: Not on file  . Food insecurity - inability: Not on file  . Transportation needs - medical: Not on file  . Transportation needs - non-medical: Not on file  Occupational History  . Occupation: Lawyer  Tobacco Use  . Smoking status: Former Research scientist (life sciences)  . Smokeless tobacco: Never Used  Substance and Sexual Activity  . Alcohol use: Yes    Comment: occ  . Drug use: No  . Sexual activity: Not on file  Other Topics Concern  . Not on file  Social History Narrative   Has living will   Husband is [employer] [facility] POA   Would accept resuscitation but no prolonged ventilation   Probably would not want tube feedings if cognitively unaware   Review of Systems  No fever Not sick No problem moving elbow     Objective:   Physical Exam  Constitutional: No distress.  Skin:  Small triangular skin tear---superficial Devitalized skin on lower portion (triangle) ---- removed with sterile scissors No inflammation           Assessment & Plan:

## 2017-09-30 NOTE — Assessment & Plan Note (Signed)
No infection Debrided small piece of devitalized skin Covered with neosporin/bandaid Discussed cleaning with soap and water and covering/keeping moist daily

## 2017-10-25 ENCOUNTER — Other Ambulatory Visit: Payer: Self-pay | Admitting: Family Medicine

## 2017-10-25 DIAGNOSIS — G4700 Insomnia, unspecified: Secondary | ICD-10-CM

## 2017-10-25 MED ORDER — CLORAZEPATE DIPOTASSIUM 7.5 MG PO TABS: ORAL_TABLET | ORAL | 0 refills | Status: DC

## 2017-10-25 NOTE — Telephone Encounter (Signed)
Last filled 09-06-17 #60 Last OV 09-30-17 Next OV 03-31-18

## 2017-10-25 NOTE — Telephone Encounter (Signed)
Left refill on voice mail at pharmacy  

## 2017-10-25 NOTE — Telephone Encounter (Signed)
Approved: #60 x 0 

## 2017-11-20 ENCOUNTER — Other Ambulatory Visit: Payer: Self-pay | Admitting: Internal Medicine

## 2017-11-20 NOTE — Telephone Encounter (Signed)
Approved: #30 x 5 Remind her that it is safer if she doesn't take this every day

## 2017-11-20 NOTE — Telephone Encounter (Signed)
Refill requested for meloxicam. Last refilled # 30 x 5 on 05/21/17. Last annual 03/28/17.

## 2017-11-21 ENCOUNTER — Emergency Department: Payer: Medicare Other

## 2017-11-21 ENCOUNTER — Other Ambulatory Visit: Payer: Self-pay

## 2017-11-21 ENCOUNTER — Ambulatory Visit: Payer: Self-pay

## 2017-11-21 ENCOUNTER — Inpatient Hospital Stay
Admission: EM | Admit: 2017-11-21 | Discharge: 2017-11-23 | DRG: 446 | Disposition: A | Discharge status: Home / Self Care | Payer: Medicare Other | Attending: Internal Medicine | Admitting: Internal Medicine

## 2017-11-21 ENCOUNTER — Encounter: Payer: Self-pay | Admitting: Emergency Medicine

## 2017-11-21 DIAGNOSIS — R112 Nausea with vomiting, unspecified: Secondary | ICD-10-CM | POA: Diagnosis not present

## 2017-11-21 DIAGNOSIS — K8020 Calculus of gallbladder without cholecystitis without obstruction: Secondary | ICD-10-CM | POA: Diagnosis not present

## 2017-11-21 DIAGNOSIS — Z87891 Personal history of nicotine dependence: Secondary | ICD-10-CM

## 2017-11-21 DIAGNOSIS — R109 Unspecified abdominal pain: Secondary | ICD-10-CM | POA: Diagnosis not present

## 2017-11-21 DIAGNOSIS — R1013 Epigastric pain: Secondary | ICD-10-CM | POA: Diagnosis not present

## 2017-11-21 DIAGNOSIS — K449 Diaphragmatic hernia without obstruction or gangrene: Secondary | ICD-10-CM | POA: Diagnosis not present

## 2017-11-21 DIAGNOSIS — Z8249 Family history of ischemic heart disease and other diseases of the circulatory system: Secondary | ICD-10-CM | POA: Diagnosis not present

## 2017-11-21 DIAGNOSIS — R17 Unspecified jaundice: Secondary | ICD-10-CM | POA: Diagnosis not present

## 2017-11-21 DIAGNOSIS — K219 Gastro-esophageal reflux disease without esophagitis: Secondary | ICD-10-CM | POA: Diagnosis not present

## 2017-11-21 DIAGNOSIS — K838 Other specified diseases of biliary tract: Principal | ICD-10-CM | POA: Diagnosis present

## 2017-11-21 DIAGNOSIS — E876 Hypokalemia: Secondary | ICD-10-CM | POA: Diagnosis not present

## 2017-11-21 DIAGNOSIS — F419 Anxiety disorder, unspecified: Secondary | ICD-10-CM | POA: Diagnosis present

## 2017-11-21 DIAGNOSIS — Z90722 Acquired absence of ovaries, bilateral: Secondary | ICD-10-CM | POA: Diagnosis not present

## 2017-11-21 DIAGNOSIS — K831 Obstruction of bile duct: Secondary | ICD-10-CM | POA: Diagnosis not present

## 2017-11-21 DIAGNOSIS — Z808 Family history of malignant neoplasm of other organs or systems: Secondary | ICD-10-CM

## 2017-11-21 DIAGNOSIS — R945 Abnormal results of liver function studies: Secondary | ICD-10-CM | POA: Diagnosis not present

## 2017-11-21 DIAGNOSIS — Z9049 Acquired absence of other specified parts of digestive tract: Secondary | ICD-10-CM | POA: Diagnosis not present

## 2017-11-21 DIAGNOSIS — M1990 Unspecified osteoarthritis, unspecified site: Secondary | ICD-10-CM | POA: Diagnosis not present

## 2017-11-21 DIAGNOSIS — R7989 Other specified abnormal findings of blood chemistry: Secondary | ICD-10-CM | POA: Diagnosis present

## 2017-11-21 DIAGNOSIS — I10 Essential (primary) hypertension: Secondary | ICD-10-CM | POA: Diagnosis not present

## 2017-11-21 DIAGNOSIS — Z90710 Acquired absence of both cervix and uterus: Secondary | ICD-10-CM | POA: Diagnosis not present

## 2017-11-21 DIAGNOSIS — K8050 Calculus of bile duct without cholangitis or cholecystitis without obstruction: Secondary | ICD-10-CM

## 2017-11-21 DIAGNOSIS — R1011 Right upper quadrant pain: Secondary | ICD-10-CM | POA: Diagnosis not present

## 2017-11-21 LAB — COMPREHENSIVE METABOLIC PANEL
ALBUMIN: 4 g/dL (ref 3.5–5.0)
ALK PHOS: 265 U/L — AB (ref 38–126)
ALT: 234 U/L — AB (ref 14–54)
ANION GAP: 8 (ref 5–15)
AST: 430 U/L — AB (ref 15–41)
BILIRUBIN TOTAL: 2.3 mg/dL — AB (ref 0.3–1.2)
BUN: 27 mg/dL — AB (ref 6–20)
CALCIUM: 9 mg/dL (ref 8.9–10.3)
CO2: 26 mmol/L (ref 22–32)
CREATININE: 0.71 mg/dL (ref 0.44–1.00)
Chloride: 106 mmol/L (ref 101–111)
GFR calc Af Amer: >60 mL/min (ref >60)
GFR calc non Af Amer: >60 mL/min (ref >60)
GLUCOSE: 147 mg/dL — AB (ref 65–99)
Potassium: 3.5 mmol/L (ref 3.5–5.1)
SODIUM: 140 mmol/L (ref 135–145)
TOTAL PROTEIN: 6.5 g/dL (ref 6.5–8.1)

## 2017-11-21 LAB — HEPATIC FUNCTION PANEL
ALBUMIN: 3.9 g/dL (ref 3.5–5.0)
ALT: 234 U/L — ABNORMAL HIGH (ref 14–54)
AST: 444 U/L — AB (ref 15–41)
Alkaline Phosphatase: 244 U/L — ABNORMAL HIGH (ref 38–126)
BILIRUBIN TOTAL: 2.4 mg/dL — AB (ref 0.3–1.2)
Bilirubin, Direct: 1.3 mg/dL — ABNORMAL HIGH (ref 0.1–0.5)
Indirect Bilirubin: 1.1 mg/dL — ABNORMAL HIGH (ref 0.3–0.9)
Total Protein: 6.5 g/dL (ref 6.5–8.1)

## 2017-11-21 LAB — CBC
HCT: 38.3 % (ref 35.0–47.0)
Hemoglobin: 13.2 g/dL (ref 12.0–16.0)
MCH: 29.1 pg (ref 26.0–34.0)
MCHC: 34.6 g/dL (ref 32.0–36.0)
MCV: 84.2 fL (ref 80.0–100.0)
Platelets: 165 10*3/uL (ref 150–440)
RBC: 4.55 MIL/uL (ref 3.80–5.20)
RDW: 13.3 % (ref 11.5–14.5)
WBC: 7.5 10*3/uL (ref 3.6–11.0)

## 2017-11-21 LAB — LIPASE, BLOOD: Lipase: 55 U/L — ABNORMAL HIGH (ref 11–51)

## 2017-11-21 MED ORDER — ACETAMINOPHEN 325 MG PO TABS: 650.0000 mg | ORAL_TABLET | Freq: Four times a day (QID) | ORAL | Status: DC | PRN

## 2017-11-21 MED ORDER — OXYCODONE-ACETAMINOPHEN 5-325 MG PO TABS
1.0000 | ORAL_TABLET | ORAL | Status: DC | PRN
  Filled 2017-11-21: qty 1

## 2017-11-21 MED ORDER — FAMOTIDINE 20 MG PO TABS
20.0000 mg | ORAL_TABLET | Freq: Every day | ORAL | Status: DC
  Administered 2017-11-21 – 2017-11-22 (×2): 20 mg via ORAL
  Filled 2017-11-21 (×2): qty 1

## 2017-11-21 MED ORDER — HYDROCHLOROTHIAZIDE 12.5 MG PO CAPS
12.5000 mg | ORAL_CAPSULE | Freq: Every morning | ORAL | Status: DC
  Administered 2017-11-23: 12.5 mg via ORAL
  Filled 2017-11-21: qty 1

## 2017-11-21 MED ORDER — CLORAZEPATE DIPOTASSIUM 7.5 MG PO TABS
7.5000 mg | ORAL_TABLET | Freq: Two times a day (BID) | ORAL | Status: DC | PRN
  Administered 2017-11-22: 7.5 mg via ORAL
  Filled 2017-11-21: qty 1

## 2017-11-21 MED ORDER — ONDANSETRON HCL 4 MG/2ML IJ SOLN: 4.0000 mg | Freq: Four times a day (QID) | INTRAMUSCULAR | Status: DC | PRN

## 2017-11-21 MED ORDER — IOPAMIDOL (ISOVUE-300) INJECTION 61%
100.0000 mL | Freq: Once | INTRAVENOUS | Status: AC | PRN
  Administered 2017-11-21: 100 mL via INTRAVENOUS

## 2017-11-21 MED ORDER — ONDANSETRON HCL 4 MG PO TABS
4.0000 mg | ORAL_TABLET | Freq: Four times a day (QID) | ORAL | Status: DC | PRN
Start: 2017-11-21 — End: 2017-11-23

## 2017-11-21 MED ORDER — OXYCODONE HCL 5 MG PO TABS
5.0000 mg | ORAL_TABLET | ORAL | Status: DC | PRN
  Administered 2017-11-22 (×2): 5 mg via ORAL
  Filled 2017-11-21 (×2): qty 1

## 2017-11-21 MED ORDER — ONDANSETRON HCL 4 MG/2ML IJ SOLN
4.0000 mg | Freq: Once | INTRAMUSCULAR | Status: AC
  Administered 2017-11-21: 4 mg via INTRAVENOUS
  Filled 2017-11-21: qty 2

## 2017-11-21 MED ORDER — ACETAMINOPHEN 650 MG RE SUPP: 650.0000 mg | Freq: Four times a day (QID) | RECTAL | Status: DC | PRN

## 2017-11-21 MED ORDER — IOPAMIDOL (ISOVUE-300) INJECTION 61%
30.0000 mL | Freq: Once | INTRAVENOUS | Status: AC | PRN
  Administered 2017-11-21: 30 mL via ORAL

## 2017-11-21 MED ORDER — PANTOPRAZOLE SODIUM 40 MG PO TBEC
40.0000 mg | DELAYED_RELEASE_TABLET | Freq: Every day | ORAL | Status: DC
Start: 2017-11-22 — End: 2017-11-23
  Administered 2017-11-22 – 2017-11-23 (×2): 40 mg via ORAL
  Filled 2017-11-21 (×2): qty 1

## 2017-11-21 NOTE — H&P (Signed)
[facility] NAME: Dominique Hall    MR#:  [id number]  DATE OF BIRTH:  04/26/1935  DATE OF ADMISSION:  11/21/2017  PRIMARY CARE PHYSICIAN: Venia Carbon, MD   REQUESTING/REFERRING PHYSICIAN: Dr Arta Silence  CHIEF COMPLAINT:   Chief Complaint  Patient presents with  . Abdominal Pain    HISTORY OF PRESENT ILLNESS:  Dominique Hall  is a 83 y.o. female presented with severe pain across her abdomen.  She states the pain was 20 out of 10 in intensity.  It lasted 5 or 6 hours.  She had some nausea vomiting.  The patient did have some chills.  No constipation or diarrhea.  When the nurse went to get some medication and came back her pain was gone.  She was found to have elevated liver function tests.  An ultrasound of the abdomen and CAT scan were done that showed dilated biliary ducts.  The ER physician contacted the gastroenterologist for potential for ERCP tomorrow morning.  PAST MEDICAL HISTORY:   Past Medical History:  Diagnosis Date  . Allergy   . Arthritis   . Diverticulosis   . Esophageal dysmotility   . Esophageal ring   . GERD (gastroesophageal reflux disease)   . Hiatal hernia   . HTN (hypertension)   . IBS (irritable bowel syndrome)   . Osteopenia   . Renal disorder   . Sleep disturbance     PAST SURGICAL HISTORY:   Past Surgical History:  Procedure Laterality Date  . ABDOMINAL HYSTERECTOMY    . APPENDECTOMY  1973  . BREAST EXCISIONAL BIOPSY Right   . CHOLECYSTECTOMY    . ESOPHAGOGASTRODUODENOSCOPY  07-2006   with dilation  . [place]  . lumpectomy  [phone]   benign right breast  . SQUAMOUS CELL CARCINOMA EXCISION  6/12   Right leg--Dr Pat Patrick  . SQUAMOUS CELL CARCINOMA EXCISION  2012   right calf  . TOTAL ABDOMINAL HYSTERECTOMY W/ BILATERAL SALPINGOOPHORECTOMY  1973    SOCIAL HISTORY:   Social History   Tobacco Use  . Smoking status: Former Research scientist (life sciences)  . Smokeless tobacco:  Never Used  Substance Use Topics  . Alcohol use: Yes    Comment: occ    FAMILY HISTORY:   Family History  Problem Relation Age of Onset  . Heart attack Father   . Stroke Mother   . Melanoma Daughter     DRUG ALLERGIES:   Allergies  Allergen Reactions  . Penicillins     REACTION: unspecified  . Rabeprazole Sodium     REACTION: constipated  . Sulfonamide Derivatives     REACTION: unspecified  . Cefuroxime     diarrhea    REVIEW OF SYSTEMS:  CONSTITUTIONAL: No fever, positive for chills.  Positive for weight loss.  No fatigue or weakness.  EYES: No blurred or double vision.  Wears glasses. EARS, NOSE, AND THROAT: No tinnitus or ear pain. No sore throat.  Wears hearing aids. RESPIRATORY: No cough, shortness of breath, wheezing or hemoptysis.  CARDIOVASCULAR: No chest pain, orthopnea, edema.  GASTROINTESTINAL: Positive for nausea, vomiting, and abdominal pain. No blood in bowel movements GENITOURINARY: No dysuria, hematuria.  ENDOCRINE: No polyuria, nocturia,  HEMATOLOGY: No anemia, easy bruising or bleeding SKIN: No rash or lesion. MUSCULOSKELETAL: No joint pain or arthritis.   NEUROLOGIC: Passed out a few times in the ER waiting room PSYCHIATRY: No anxiety or depression.   MEDICATIONS AT HOME:   Prior to  Admission medications   Medication Sig Start Date End Date Taking? Authorizing Provider  clorazepate (TRANXENE) 7.5 MG tablet Take 1 to 2 tablets at bedtime as needed. 10/25/17  Yes Venia Carbon, MD  estradiol (ESTRACE) 0.1 MG/GM vaginal cream Place 1 Applicatorful vaginally 3 (three) times a week. 03/29/17  Yes [name] I, MD  hydrochlorothiazide (MICROZIDE) 12.5 MG capsule TAKE 1 CAPSULE EVERY       MORNING 05/06/17  Yes [name] I, MD  meloxicam (MOBIC) 7.5 MG tablet TAKE 1 TABLET BY MOUTH EVERY DAY 11/20/17  Yes Venia Carbon, MD  Multiple Vitamin (MULTIVITAMIN) tablet Take 1 tablet by mouth daily.   Yes [provider]  NEXIUM 40 MG  capsule Take 1 capsule (40 mg total) by mouth daily. 07/01/17  Yes Venia Carbon, MD  Omega-3 Fatty Acids (FISH OIL PO) Take 1 capsule by mouth daily.   Yes [provider]  vitamin C (ASCORBIC ACID) 500 MG tablet Take 500 mg by mouth daily.   Yes [provider]  ZANTAC 150 MG tablet Take 1 tablet (150 mg total) by mouth at bedtime. 01/23/16  Yes Pleas Koch, NP  ZOFRAN 4 MG tablet Take 1 tablet (4 mg total) by mouth every 8 (eight) hours as needed for nausea or vomiting. 09/06/17  Yes Elby Beck, FNP      VITAL SIGNS:  Blood pressure 112/75, pulse 77, temperature (!) 97.4 F (36.3 C), temperature source Oral, resp. rate 20, weight 69.9 kg (154 lb), SpO2 99 %.  PHYSICAL EXAMINATION:  GENERAL:  83 y.o.-year-old patient lying in the bed with no acute distress.  EYES: Pupils equal, round, reactive to light and accommodation. No scleral icterus. Extraocular muscles intact.  HEENT: Head atraumatic, normocephalic. Oropharynx and nasopharynx clear.  NECK:  Supple, no jugular venous distention. No thyroid enlargement, no tenderness.  LUNGS: Normal breath sounds bilaterally, no wheezing, rales,rhonchi or crepitation. No use of accessory muscles of respiration.  CARDIOVASCULAR: S1, S2 normal. No murmurs, rubs, or gallops.  ABDOMEN: Soft, nontender, nondistended. Bowel sounds present. No organomegaly or mass.  EXTREMITIES: No pedal edema, cyanosis, or clubbing.  NEUROLOGIC: Cranial nerves II through XII are intact. Muscle strength 5/5 in all extremities. Sensation intact. Gait not checked.  PSYCHIATRIC: The patient is alert and oriented x 3.  SKIN: No rash, lesion, or ulcer.   LABORATORY PANEL:   CBC Recent Labs  Lab 11/21/17 1327  WBC 7.5  HGB 13.2  HCT 38.3  PLT 165   ------------------------------------------------------------------------------------------------------------------  Chemistries  Recent Labs  Lab 11/21/17 1327  NA 140  K 3.5  CL  106  CO2 26  GLUCOSE 147*  BUN 27*  CREATININE 0.71  CALCIUM 9.0  AST 430*  ALT 234*  ALKPHOS 265*  BILITOT 2.3*   ------------------------------------------------------------------------------------------------------------------    RADIOLOGY:  Ct Abdomen Pelvis W Contrast  Result Date: 11/21/2017 CLINICAL DATA:  Upper abdominal pain. EXAM: CT ABDOMEN AND PELVIS WITH CONTRAST TECHNIQUE: Multidetector CT imaging of the abdomen and pelvis was performed using the standard protocol following bolus administration of intravenous contrast. CONTRAST:  173mL ISOVUE-300 IOPAMIDOL (ISOVUE-300) INJECTION 61% COMPARISON:  Right upper quadrant abdominal ultrasound earlier today. CT of the abdomen and pelvis without contrast on 08/14/2015. FINDINGS: Lower chest: No acute abnormality. Hepatobiliary: 1.8 cm caudate and 1.6 cm inferior right lobe hepatic cysts have a benign appearance. Status post cholecystectomy with dilatation of the common bile duct up to 10 mm near the liver. The common bile duct  appears tortuous. On the coronal reconstructions, there is suggestion of some potential subtle filling defects in the posterior aspect of the common bile duct. However, this may be secondary to bile duct tortuosity rather than choledocholithiasis. Correlation suggested with liver function tests and symptoms. No evidence of intrahepatic biliary ductal dilatation. Pancreas: Unremarkable. No pancreatic ductal dilatation or surrounding inflammatory changes. Spleen: Normal in size without focal abnormality. Adrenals/Urinary Tract: Adrenal glands are unremarkable. Stable bilateral renal cysts. 7 mm calculus in the left lower kidney. No hydronephrosis. Bladder is unremarkable. Stomach/Bowel: Small hiatal hernia present. No evidence of bowel obstruction or inflammation. No lesions identified. Diverticulosis of the sigmoid colon present without evidence of diverticulitis. Vascular/Lymphatic: No significant vascular findings are  present. No enlarged abdominal or pelvic lymph nodes. Reproductive: Status post hysterectomy. No adnexal masses. Other: No abdominal wall hernia or abnormality. No abdominopelvic ascites. Musculoskeletal: No acute or significant osseous findings. IMPRESSION: Dilated extrahepatic bile ducts measuring up to 10 mm without intrahepatic biliary dilatation. This may be secondary to cholecystectomy, but there may be some subtle filling defects in the common bile duct which also appears tortuous. Correlation suggested with liver function tests. Further evaluation with MRI/MRCP versus ERCP may be helpful if there is clinical suspicion of choledocholithiasis. Electronically Signed   By: Aletta Edouard M.D.   On: 11/21/2017 18:25   US Abdomen Limited Ruq  Result Date: 11/21/2017 CLINICAL DATA:  Epigastric abdominal pain. EXAM: ULTRASOUND ABDOMEN LIMITED RIGHT UPPER QUADRANT COMPARISON:  Body CT 08/14/2015 FINDINGS: Gallbladder: Surgically absent. Common bile duct: Diameter: 8 mm Liver: Within normal limits in parenchymal echogenicity. There is a hypoechoic circumscribed mass in the inferior pole of the right lobe of the liver measuring 1.5 x 1.6 x 1.6 cm. When compared to body CT dated October 2016, when it measured 1.3 cm in diameter, this lesion has enlarged. Portal vein is patent on color Doppler imaging with normal direction of blood flow towards the liver. IMPRESSION: Mildly enlarged hypoechoic lesion in the right hepatic lobe, measuring 1.6 cm. The water density and lack of internal blood flow suggest a benign lesion. Electronically Signed   By: Fidela Salisbury M.D.   On: 11/21/2017 17:24    EKG:   Sinus rhythm with first-degree AV block  IMPRESSION AND PLAN:   1.  Elevated liver function test and severe abdominal pain with nausea vomiting and dilated ducts seen on imaging.  Check liver function test tomorrow morning.  N.p.o. after midnight.  Since the patient is having no abdominal pain right now she  may have passed a stone.  ERCP if worsening liver function tests.  Patient also had slightly elevated lipase. 2.  GERD continue PPI and H2 blocker 3.  History of anxiety on Tranxene 4.  Essential hypertension on hydrochlorothiazide  All the records are reviewed and case discussed with ED provider. Management plans discussed with the patient, family and they are in agreement.  CODE STATUS: Full code  TOTAL TIME TAKING CARE OF THIS PATIENT: 50 minutes.    Loletha Grayer M.D on 11/21/2017 at 9:14 PM  Between 7am to 6pm - Pager - [phone]  After 6pm call admission pager [phone]  [facility]  [phone]  CC: Primary care physician; Venia Carbon, MD

## 2017-11-21 NOTE — ED Notes (Signed)
Patient transported to CT 

## 2017-11-21 NOTE — ED Notes (Signed)
Pt in subwait and had put her call light on. She told me she was in terrible pain--upper abdomen, and was saying "help me"  I offered her a percocet, and went and got the med.  When I brought it to the room the patient said her pain was gone now for the first time since this am.  She declines the med at this time, and has her call light in case it returns.

## 2017-11-21 NOTE — ED Notes (Addendum)
Dr. Leslye Peer in to see patient at this time.

## 2017-11-21 NOTE — ED Notes (Signed)
Pt is taken to floor in wheelchair. VSS. NAD. Report called to RN. All questions answered.

## 2017-11-21 NOTE — ED Notes (Signed)
Pt sitting up in room at this time. NAD. Denies any pain. Will continue to monitor for changes.

## 2017-11-21 NOTE — Telephone Encounter (Signed)
Will review status after that evaluation

## 2017-11-21 NOTE — Telephone Encounter (Signed)
   Reason for Disposition . [1] SEVERE pain (e.g., excruciating) AND [2] present > 1 hour  Answer Assessment - Initial Assessment Questions 1. LOCATION: "Where does it hurt?"      Midline above the naval 2. RADIATION: "Does the pain shoot anywhere else?" (e.g., chest, back)     Hurts into her back 3. ONSET: "When did the pain begin?" (e.g., minutes, hours or days ago)      Started this morning 4. SUDDEN: "Gradual or sudden onset?"     Sudden 5. PATTERN "Does the pain come and go, or is it constant?"    - If constant: "Is it getting better, staying the same, or worsening?"      (Note: Constant means the pain never goes away completely; most serious pain is constant and it progresses)     - If intermittent: "How long does it last?" "Do you have pain now?"     (Note: Intermittent means the pain goes away completely between bouts)     Maalox helped a little bit 6. SEVERITY: "How bad is the pain?"  (e.g., Scale 1-10; mild, moderate, or severe)   - MILD (1-3): doesn't interfere with normal activities, abdomen soft and not tender to touch    - MODERATE (4-7): interferes with normal activities or awakens from sleep, tender to touch    - SEVERE (8-10): excruciating pain, doubled over, unable to do any normal activities      10 7. RECURRENT SYMPTOM: "Have you ever had this type of abdominal pain before?" If so, ask: "When was the last time?" and "What happened that time?"      No 8. CAUSE: "What do you think is causing the abdominal pain?"     Unsure 9. RELIEVING/AGGRAVATING FACTORS: "What makes it better or worse?" (e.g., movement, antacids, bowel movement)     Maalox  10. OTHER SYMPTOMS: "Has there been any vomiting, diarrhea, constipation, or urine problems?"       No 11. PREGNANCY: "Is there any chance you are pregnant?" "When was your last menstrual period?"       No  Protocols used: ABDOMINAL PAIN - FEMALE-A-AH  Pt. [facility] go to ED at [facility]

## 2017-11-21 NOTE — ED Provider Notes (Signed)
[facility] Emergency Department Provider Note ____________________________________________   First MD Initiated Contact with Patient 11/21/17 1608     (approximate)  I have reviewed the triage vital signs and the nursing notes.   HISTORY  Chief Complaint Abdominal Pain    HPI Dominique Hall is a 83 y.o. female with past medical history as noted below who presents with epigastric abdominal pain, gradual onset since last night and worse since this morning, constant, and associated with nausea and one episode of vomiting.  Patient denies any change in her bowel movements or any fever or urinary symptoms.  She states that she got a Percocet in the waiting room and this has relieved the pain but she still feels nauseous.  She reports a prior bout of similar pain about 1 month ago that was shorter in time course and resolved on its own.  She reports having had a hysterectomy and having her gallbladder taken out several years ago.  Past Medical History:  Diagnosis Date  . Allergy   . Arthritis   . Diverticulosis   . Esophageal dysmotility   . Esophageal ring   . GERD (gastroesophageal reflux disease)   . Hiatal hernia   . HTN (hypertension)   . IBS (irritable bowel syndrome)   . Osteopenia   . Renal disorder   . Sleep disturbance     Patient Active Problem List   Diagnosis Date Noted  . Skin tear of elbow without complication 12/75/1700  . Left knee pain 12/14/2016  . Solitary pulmonary nodule 12/22/2015  . Advance directive discussed with patient 03/17/2015  . Atrophic vaginitis 01/29/2014  . Cystocele, grade 1-2 01/29/2014  . Fibrocystic breast changes 12/05/2012  . Routine general medical examination at a health care facility 08/14/2011  . Mild mood disorder ([place]) 04/15/2007  . Essential hypertension, benign 04/15/2007  . ALLERGIC RHINITIS 04/15/2007  . GERD 04/15/2007  . DIVERTICULOSIS, COLON 04/15/2007  . OSTEOPENIA 04/15/2007    Past  Surgical History:  Procedure Laterality Date  . ABDOMINAL HYSTERECTOMY    . APPENDECTOMY  1973  . BREAST EXCISIONAL BIOPSY Right   . CHOLECYSTECTOMY    . ESOPHAGOGASTRODUODENOSCOPY  07-2006   with dilation  . [place]  . lumpectomy  [phone]   benign right breast  . SQUAMOUS CELL CARCINOMA EXCISION  6/12   Right leg--Dr Pat Patrick  . SQUAMOUS CELL CARCINOMA EXCISION  2012   right calf  . TOTAL ABDOMINAL HYSTERECTOMY W/ BILATERAL SALPINGOOPHORECTOMY  1973    Prior to Admission medications   Medication Sig Start Date End Date Taking? Authorizing Provider  clorazepate (TRANXENE) 7.5 MG tablet Take 1 to 2 tablets at bedtime as needed. 10/25/17  Yes Venia Carbon, MD  estradiol (ESTRACE) 0.1 MG/GM vaginal cream Place 1 Applicatorful vaginally 3 (three) times a week. 03/29/17  Yes [name] I, MD  hydrochlorothiazide (MICROZIDE) 12.5 MG capsule TAKE 1 CAPSULE EVERY       MORNING 05/06/17  Yes [name] I, MD  meloxicam (MOBIC) 7.5 MG tablet TAKE 1 TABLET BY MOUTH EVERY DAY 11/20/17  Yes Venia Carbon, MD  Multiple Vitamin (MULTIVITAMIN) tablet Take 1 tablet by mouth daily.   Yes [provider]  NEXIUM 40 MG capsule Take 1 capsule (40 mg total) by mouth daily. 07/01/17  Yes Venia Carbon, MD  Omega-3 Fatty Acids (FISH OIL PO) Take 1 capsule by mouth daily.   Yes [provider]  vitamin C (ASCORBIC ACID) 500  MG tablet Take 500 mg by mouth daily.   Yes [provider]  ZANTAC 150 MG tablet Take 1 tablet (150 mg total) by mouth at bedtime. 01/23/16  Yes Pleas Koch, NP  ZOFRAN 4 MG tablet Take 1 tablet (4 mg total) by mouth every 8 (eight) hours as needed for nausea or vomiting. 09/06/17  Yes Elby Beck, FNP    Allergies Penicillins; Rabeprazole sodium; Sulfonamide derivatives; and Cefuroxime  Family History  Problem Relation Age of Onset  . Heart attack Father   . Stroke Mother   . Melanoma Daughter     Social  History Social History   Tobacco Use  . Smoking status: Former Research scientist (life sciences)  . Smokeless tobacco: Never Used  Substance Use Topics  . Alcohol use: Yes    Comment: occ  . Drug use: No    Review of Systems  Constitutional: No fever.  Eyes: No redness. ENT: No sore throat. Cardiovascular: Denies chest pain. Respiratory: Denies shortness of breath. Gastrointestinal: Positive for nausea.  Genitourinary: Negative for dysuria.  Musculoskeletal: Negative for back pain. Skin: Negative for rash. Neurological: Negative for headache.   ____________________________________________   PHYSICAL EXAM:  VITAL SIGNS: ED Triage Vitals [11/21/17 1324]  Enc Vitals Group     BP 100/77     Pulse Rate 76     Resp 18     Temp (!) 97.4 F (36.3 C)     Temp Source Oral     SpO2 98 %     Weight 154 lb (69.9 kg)     Height      Head Circumference      Peak Flow      Pain Score 10     Pain Loc      Pain Edu?      Excl. in [place]?     Constitutional: Alert and oriented. Well appearing and in no acute distress. Eyes: Conjunctivae are normal.  No scleral icterus. Head: Atraumatic. Nose: No congestion/rhinnorhea. Mouth/Throat: Mucous membranes are moist.   Neck: Normal range of motion.  Cardiovascular: Normal rate, regular rhythm. Grossly normal heart sounds.  Good peripheral circulation. Respiratory: Normal respiratory effort.  No retractions. Lungs CTAB. Gastrointestinal: Soft with mild bilateral upper quadrant and epigastric tenderness. No distention.  Genitourinary: No flank tenderness. Musculoskeletal: No lower extremity edema.  Extremities warm and well perfused.  Neurologic:  Normal speech and language. No gross focal neurologic deficits are appreciated.  Skin:  Skin is warm and dry. No rash noted. Psychiatric: Mood and affect are normal. Speech and behavior are normal.  ____________________________________________   LABS (all labs ordered are listed, but only abnormal results are  displayed)  Labs Reviewed  LIPASE, BLOOD - Abnormal; Notable for the following components:      Result Value   Lipase 55 (*)    All other components within normal limits  COMPREHENSIVE METABOLIC PANEL - Abnormal; Notable for the following components:   Glucose, Bld 147 (*)    BUN 27 (*)    AST 430 (*)    ALT 234 (*)    Alkaline Phosphatase 265 (*)    Total Bilirubin 2.3 (*)    All other components within normal limits  CBC  URINALYSIS, COMPLETE (UACMP) WITH MICROSCOPIC   ____________________________________________  EKG  ED ECG REPORT I, Arta Silence, the attending physician, personally viewed and interpreted this ECG.  Date: 11/21/2017 EKG Time: 1325 Rate: 79 Rhythm: normal sinus rhythm QRS Axis: normal Intervals: First-degree AV block ST/T Wave  abnormalities: normal Narrative Interpretation: no evidence of acute ischemia  ____________________________________________  RADIOLOGY  RUQ [place]: Hypoechoic lesion in the right hepatic lobe CT abdomen: extrahepatic and intrahepatic biliary dilatation  ____________________________________________   PROCEDURES  Procedure(s) performed: No    Critical Care performed: No ____________________________________________   INITIAL IMPRESSION / ASSESSMENT AND PLAN / ED COURSE  Pertinent labs & imaging results that were available during my care of the patient were reviewed by me and considered in my medical decision making (see chart for details).  83 year old female with past medical history as noted above presents with worsening constant epigastric and bilateral upper quadrant pain since last night, associated with nausea and one episode of vomiting.  On exam, the patient is relatively comfortable appearing, the vital signs are normal, and she has mild tenderness to bilateral upper quadrants and the epigastric area.  Past medical records reviewed in Epic and are noncontributory.  Initial labs show the patient's LFTs  and alk phos are elevated.  Given this finding and the fact the patient is status post cholecystectomy, differential includes acute hepatitis, biliary tract disease, retained stone, pancreatitis.  Lower suspicion for gastritis or PUD given the abnormal LFTs.  Plan for right upper quadrant ultrasound and if nondiagnostic, CT abdomen.  Clinical Course as of Nov 21 2000  Thu Nov 21, 2017  1736 WBC: 7.5 [SS]    Clinical Course User Index [SS] Arta Silence, MD   ----------------------------------------- 8:00 PM on 11/21/2017 -----------------------------------------  Ultrasound did not show any significant findings except for previously seen hypoechoic liver lesion which is likely benign.  We proceeded with a CT, which shows bile duct dilatation and is somewhat concerning for choledocholithiasis in the context of patient's elevated LFTs and bilirubin.  I consulted Dr. Bonna Gains from GI about the findings.  Given that the patient has normal vital signs, and is afebrile, she recommended that the patient be admitted overnight for planned ERCP in the morning.  She recommends that the patient be n.p.o. after midnight and that if she develops a fever she should get antibiotics and may need an MRCP during the night.  I explained the findings of the workup and the plan of care to the patient and her husband, and they expressed understanding.  We will proceed with admission and I will sign the patient out to the hospitalist.  ____________________________________________   FINAL CLINICAL IMPRESSION(S) / ED DIAGNOSES  Final diagnoses:  Epigastric abdominal pain  Common bile duct dilation      NEW MEDICATIONS STARTED DURING THIS VISIT:  New Prescriptions   No medications on file     Note:  This document was prepared using Dragon voice recognition software and may include unintentional dictation errors.    Arta Silence, MD 11/21/17 2003

## 2017-11-21 NOTE — ED Triage Notes (Signed)
Pt to ed with c/o upper abd pain and epigastric pain acute onset this am,  States she took maalox and felt better for a little while but then pain returned more severely.  EKG done at triage.

## 2017-11-21 NOTE — Telephone Encounter (Signed)
Per chart review tab pt went to [facility] ED. 

## 2017-11-21 NOTE — ED Notes (Signed)
Pt returned from CT at this time.  

## 2017-11-21 NOTE — ED Notes (Signed)
Pt drinking CT contrast at this time.

## 2017-11-22 ENCOUNTER — Inpatient Hospital Stay: Payer: Medicare Other | Admitting: Certified Registered Nurse Anesthetist

## 2017-11-22 ENCOUNTER — Inpatient Hospital Stay: Payer: Medicare Other

## 2017-11-22 ENCOUNTER — Encounter: Payer: Self-pay | Admitting: Certified Registered Nurse Anesthetist

## 2017-11-22 ENCOUNTER — Encounter
Admission: EM | Disposition: A | Discharge status: Home / Self Care | Payer: Self-pay | Source: Home / Self Care | Attending: Internal Medicine

## 2017-11-22 DIAGNOSIS — Z87891 Personal history of nicotine dependence: Secondary | ICD-10-CM | POA: Diagnosis not present

## 2017-11-22 DIAGNOSIS — K219 Gastro-esophageal reflux disease without esophagitis: Secondary | ICD-10-CM | POA: Diagnosis present

## 2017-11-22 DIAGNOSIS — F419 Anxiety disorder, unspecified: Secondary | ICD-10-CM | POA: Diagnosis present

## 2017-11-22 DIAGNOSIS — R109 Unspecified abdominal pain: Secondary | ICD-10-CM | POA: Diagnosis present

## 2017-11-22 DIAGNOSIS — I10 Essential (primary) hypertension: Secondary | ICD-10-CM | POA: Diagnosis present

## 2017-11-22 DIAGNOSIS — Z808 Family history of malignant neoplasm of other organs or systems: Secondary | ICD-10-CM | POA: Diagnosis not present

## 2017-11-22 DIAGNOSIS — R1013 Epigastric pain: Secondary | ICD-10-CM | POA: Diagnosis present

## 2017-11-22 DIAGNOSIS — R945 Abnormal results of liver function studies: Secondary | ICD-10-CM | POA: Diagnosis not present

## 2017-11-22 DIAGNOSIS — Z90710 Acquired absence of both cervix and uterus: Secondary | ICD-10-CM | POA: Diagnosis not present

## 2017-11-22 DIAGNOSIS — E876 Hypokalemia: Secondary | ICD-10-CM | POA: Diagnosis present

## 2017-11-22 DIAGNOSIS — Z8249 Family history of ischemic heart disease and other diseases of the circulatory system: Secondary | ICD-10-CM | POA: Diagnosis not present

## 2017-11-22 DIAGNOSIS — R17 Unspecified jaundice: Secondary | ICD-10-CM | POA: Diagnosis not present

## 2017-11-22 DIAGNOSIS — K449 Diaphragmatic hernia without obstruction or gangrene: Secondary | ICD-10-CM | POA: Diagnosis present

## 2017-11-22 DIAGNOSIS — Z9049 Acquired absence of other specified parts of digestive tract: Secondary | ICD-10-CM | POA: Diagnosis not present

## 2017-11-22 DIAGNOSIS — K838 Other specified diseases of biliary tract: Secondary | ICD-10-CM | POA: Diagnosis present

## 2017-11-22 DIAGNOSIS — Z90722 Acquired absence of ovaries, bilateral: Secondary | ICD-10-CM | POA: Diagnosis not present

## 2017-11-22 HISTORY — PX: ERCP: SHX5425

## 2017-11-22 LAB — BASIC METABOLIC PANEL
ANION GAP: 9 (ref 5–15)
BUN: 23 mg/dL — AB (ref 6–20)
CHLORIDE: 103 mmol/L (ref 101–111)
CO2: 27 mmol/L (ref 22–32)
Calcium: 8.8 mg/dL — ABNORMAL LOW (ref 8.9–10.3)
Creatinine, Ser: 0.66 mg/dL (ref 0.44–1.00)
GFR calc Af Amer: >60 mL/min (ref >60)
Glucose, Bld: 119 mg/dL — ABNORMAL HIGH (ref 65–99)
POTASSIUM: 3.1 mmol/L — AB (ref 3.5–5.1)
SODIUM: 139 mmol/L (ref 135–145)

## 2017-11-22 LAB — HEPATIC FUNCTION PANEL
ALK PHOS: 240 U/L — AB (ref 38–126)
ALT: 692 U/L — ABNORMAL HIGH (ref 14–54)
AST: 639 U/L — ABNORMAL HIGH (ref 15–41)
Albumin: 3.6 g/dL (ref 3.5–5.0)
BILIRUBIN INDIRECT: 2 mg/dL — AB (ref 0.3–0.9)
Bilirubin, Direct: 3 mg/dL — ABNORMAL HIGH (ref 0.1–0.5)
TOTAL PROTEIN: 6 g/dL — AB (ref 6.5–8.1)
Total Bilirubin: 5 mg/dL — ABNORMAL HIGH (ref 0.3–1.2)

## 2017-11-22 LAB — CBC
HEMATOCRIT: 33.8 % — AB (ref 35.0–47.0)
HEMOGLOBIN: 11.8 g/dL — AB (ref 12.0–16.0)
MCH: 29.4 pg (ref 26.0–34.0)
MCHC: 35 g/dL (ref 32.0–36.0)
MCV: 83.9 fL (ref 80.0–100.0)
Platelets: 146 10*3/uL — ABNORMAL LOW (ref 150–440)
RBC: 4.02 MIL/uL (ref 3.80–5.20)
RDW: 13.3 % (ref 11.5–14.5)
WBC: 6.9 10*3/uL (ref 3.6–11.0)

## 2017-11-22 LAB — URINALYSIS, COMPLETE (UACMP) WITH MICROSCOPIC
GLUCOSE, UA: 100 mg/dL — AB
Ketones, ur: 15 mg/dL — AB
NITRITE: NEGATIVE
PROTEIN: 30 mg/dL — AB
SPECIFIC GRAVITY, URINE: 1.02 (ref 1.005–1.030)
pH: 6 (ref 5.0–8.0)

## 2017-11-22 LAB — LIPASE, BLOOD: LIPASE: 30 U/L (ref 11–51)

## 2017-11-22 SURGERY — ERCP, WITH INTERVENTION IF INDICATED
Anesthesia: General

## 2017-11-22 MED ORDER — LIDOCAINE HCL (CARDIAC) 20 MG/ML IV SOLN
INTRAVENOUS | Status: DC | PRN
  Administered 2017-11-22: 50 mg via INTRAVENOUS

## 2017-11-22 MED ORDER — LIDOCAINE HCL (PF) 2 % IJ SOLN
INTRAMUSCULAR | Status: AC
  Filled 2017-11-22: qty 10

## 2017-11-22 MED ORDER — ONDANSETRON HCL 4 MG/2ML IJ SOLN: 4.0000 mg | Freq: Once | INTRAMUSCULAR | Status: DC | PRN

## 2017-11-22 MED ORDER — SODIUM CHLORIDE 0.9 % IV SOLN
INTRAVENOUS | Status: DC
  Administered 2017-11-22: 1000 mL via INTRAVENOUS

## 2017-11-22 MED ORDER — ONDANSETRON HCL 4 MG/2ML IJ SOLN
INTRAMUSCULAR | Status: AC
Start: 2017-11-22 — End: 2017-11-22
  Filled 2017-11-22: qty 2

## 2017-11-22 MED ORDER — FENTANYL CITRATE (PF) 100 MCG/2ML IJ SOLN: 25.0000 ug | INTRAMUSCULAR | Status: DC | PRN

## 2017-11-22 MED ORDER — SODIUM CHLORIDE 0.9 % IV SOLN
INTRAVENOUS | Status: DC
  Administered 2017-11-22: 11:00:00 via INTRAVENOUS

## 2017-11-22 MED ORDER — SUCCINYLCHOLINE CHLORIDE 20 MG/ML IJ SOLN
INTRAMUSCULAR | Status: DC | PRN
  Administered 2017-11-22: 80 mg via INTRAVENOUS

## 2017-11-22 MED ORDER — PHENYLEPHRINE HCL 10 MG/ML IJ SOLN
INTRAMUSCULAR | Status: DC | PRN
  Administered 2017-11-22: 100 ug via INTRAVENOUS

## 2017-11-22 MED ORDER — INDOMETHACIN 50 MG RE SUPP
RECTAL | Status: AC
  Administered 2017-11-22: 15:00:00
  Filled 2017-11-22: qty 2

## 2017-11-22 MED ORDER — INDOMETHACIN 50 MG RE SUPP
100.0000 mg | Freq: Once | RECTAL | Status: AC
  Administered 2017-11-22: 100 mg via RECTAL

## 2017-11-22 MED ORDER — PROPOFOL 10 MG/ML IV BOLUS
INTRAVENOUS | Status: DC | PRN
  Administered 2017-11-22: 110 mg via INTRAVENOUS

## 2017-11-22 MED ORDER — ONDANSETRON HCL 4 MG/2ML IJ SOLN
INTRAMUSCULAR | Status: DC | PRN
  Administered 2017-11-22: 4 mg via INTRAVENOUS

## 2017-11-22 MED ORDER — SUCCINYLCHOLINE CHLORIDE 20 MG/ML IJ SOLN
INTRAMUSCULAR | Status: AC
  Filled 2017-11-22: qty 1

## 2017-11-22 MED ORDER — POTASSIUM CHLORIDE 10 MEQ/100ML IV SOLN
10.0000 meq | INTRAVENOUS | Status: AC
  Administered 2017-11-22 (×2): 10 meq via INTRAVENOUS
  Filled 2017-11-22 (×3): qty 100

## 2017-11-22 MED ORDER — PROPOFOL 500 MG/50ML IV EMUL
INTRAVENOUS | Status: AC
  Filled 2017-11-22: qty 50

## 2017-11-22 NOTE — Anesthesia Procedure Notes (Signed)
Procedure Name: Intubation Date/Time: 11/22/2017 1:49 PM Performed by: Eben Burow, CRNA Pre-anesthesia Checklist: Patient identified, Emergency Drugs available, Suction available, Patient being monitored and Timeout performed Patient Re-evaluated:Patient Re-evaluated prior to induction Oxygen Delivery Method: Circle system utilized Preoxygenation: Pre-oxygenation with 100% oxygen Induction Type: IV induction Ventilation: Mask ventilation without difficulty Laryngoscope Size: Mac and 3 Grade View: Grade II Tube type: Oral Tube size: 7.0 mm Number of attempts: 1 Airway Equipment and Method: Stylet Placement Confirmation: ETT inserted through vocal cords under direct vision,  positive ETCO2 and breath sounds checked- equal and bilateral Secured at: 22 cm Tube secured with: Tape Dental Injury: Teeth and Oropharynx as per pre-operative assessment

## 2017-11-22 NOTE — Consult Note (Signed)
Dominique Darby, MD [address]  [place]  [place], [place] [address]  Main: [phone]  Fax: [phone] Pager: [phone]   Consultation  Referring Provider:     No ref. provider found Primary Care Physician:  Venia Carbon, MD Primary Gastroenterologist:  None       Reason for Consultation:     Choledocholithiasis  Date of Admission:  11/21/2017 Date of Consultation:  11/22/2017         HPI:   Dominique Hall is a 83 y.o. female with no significant past medical history presents with sudden onset of upper abdominal pain that started yesterday morning. She took Maalox which provided some relief but pain recurred. She had cholecystectomy in 2005 secondary to symptomatic cholelithiasis. In the ER, she is found to have elevated LFTs concerning for biliary obstruction. Initially her total bilirubin was 2.3, increased to 5 today, predominantly direct bilirubin which was 3. Her alkaline phosphatase 240, AST 639, ALT 692. She also underwent CT A/P which revealed dilated CBD to 10 mm and subtle filling defects in the CBD. Therefore, GI is consulted for evaluation of the need for ERCP. During my encounter, patient denies abdominal pain, nausea, vomiting. She denies fevers, chills. She denies having similar episode in the past since cholecystectomy in 2005. She denies herbal supplements, alcohol use or Excess Tylenol use, no recent antibiotics either.   NSAIDs: Meloxicam 7.5 mg daily  Antiplts/Anticoagulants/Anti thrombotics: None  GI Procedures:  Reviewed under procedures  Past Medical History:  Diagnosis Date  . Allergy   . Arthritis   . Diverticulosis   . Esophageal dysmotility   . Esophageal ring   . GERD (gastroesophageal reflux disease)   . Hiatal hernia   . HTN (hypertension)   . IBS (irritable bowel syndrome)   . Osteopenia   . Renal disorder   . Sleep disturbance     Past Surgical History:  Procedure Laterality Date  . ABDOMINAL HYSTERECTOMY    .  APPENDECTOMY  1973  . BREAST EXCISIONAL BIOPSY Right   . CHOLECYSTECTOMY    . ESOPHAGOGASTRODUODENOSCOPY  07-2006   with dilation  . [place]  . lumpectomy  [phone]   benign right breast  . SQUAMOUS CELL CARCINOMA EXCISION  6/12   Right leg--Dr Pat Patrick  . SQUAMOUS CELL CARCINOMA EXCISION  2012   right calf  . TOTAL ABDOMINAL HYSTERECTOMY W/ BILATERAL SALPINGOOPHORECTOMY  1973    Prior to Admission medications   Medication Sig Start Date End Date Taking? Authorizing Provider  clorazepate (TRANXENE) 7.5 MG tablet Take 1 to 2 tablets at bedtime as needed. 10/25/17  Yes Venia Carbon, MD  estradiol (ESTRACE) 0.1 MG/GM vaginal cream Place 1 Applicatorful vaginally 3 (three) times a week. 03/29/17  Yes [name] I, MD  hydrochlorothiazide (MICROZIDE) 12.5 MG capsule TAKE 1 CAPSULE EVERY       MORNING 05/06/17  Yes [name] I, MD  meloxicam (MOBIC) 7.5 MG tablet TAKE 1 TABLET BY MOUTH EVERY DAY 11/20/17  Yes Venia Carbon, MD  Multiple Vitamin (MULTIVITAMIN) tablet Take 1 tablet by mouth daily.   Yes [provider]  NEXIUM 40 MG capsule Take 1 capsule (40 mg total) by mouth daily. 07/01/17  Yes Venia Carbon, MD  Omega-3 Fatty Acids (FISH OIL PO) Take 1 capsule by mouth daily.   Yes [provider]  vitamin C (ASCORBIC ACID) 500 MG tablet Take 500 mg by mouth daily.   Yes  [provider]  ZANTAC 150 MG tablet Take 1 tablet (150 mg total) by mouth at bedtime. 01/23/16  Yes Pleas Koch, NP  ZOFRAN 4 MG tablet Take 1 tablet (4 mg total) by mouth every 8 (eight) hours as needed for nausea or vomiting. 09/06/17  Yes Elby Beck, FNP    Family History  Problem Relation Age of Onset  . Heart attack Father   . Stroke Mother   . Melanoma Daughter      Social History   Tobacco Use  . Smoking status: Former Research scientist (life sciences)  . Smokeless tobacco: Never Used  Substance Use Topics  . Alcohol use: Yes    Comment: occ  . Drug use: No     Allergies as of 11/21/2017 - Review Complete 11/21/2017  Allergen Reaction Noted  . Penicillins  10/31/2006  . Rabeprazole sodium  10/31/2006  . Sulfonamide derivatives  10/31/2006  . Cefuroxime  10/30/2012    Review of Systems:    All systems reviewed and negative except where noted in HPI.   Physical Exam:  Vital signs in last 24 hours: Temp:  [98 F (36.7 C)-98.7 F (37.1 C)] 98.3 F (36.8 C) (01/11 1217) Pulse Rate:  [71-86] 73 (01/11 1217) Resp:  [17-20] 17 (01/11 1217) BP: (101-125)/(41-75) 101/66 (01/11 1217) SpO2:  [94 %-100 %] 96 % (01/11 1217) Last BM Date: 11/21/17 General:   Pleasant, cooperative in NAD Head:  Normocephalic and atraumatic. Eyes:   No icterus.   Conjunctiva pink. PERRLA. Ears:  Normal auditory acuity. Neck:  Supple; no masses or thyroidomegaly Lungs: Respirations even and unlabored. Lungs clear to auscultation bilaterally.   No wheezes, crackles, or rhonchi.  Heart:  Regular rate and rhythm;  Without murmur, clicks, rubs or gallops Abdomen:  Soft, nondistended, nontender. Normal bowel sounds. No appreciable masses or hepatomegaly.  No rebound or guarding.  Rectal:  Not performed. Msk:  Symmetrical without gross deformities.  Strength normal  Extremities:  Without edema, cyanosis or clubbing. Neurologic:  Alert and oriented x3;  grossly normal neurologically. Skin:  Intact without significant lesions or rashes. Cervical Nodes:  No significant cervical adenopathy. Psych:  Alert and cooperative. Normal affect.  LAB RESULTS: CBC Latest Ref Rng & Units 11/22/2017 11/21/2017 03/28/2017  WBC 3.6 - 11.0 K/uL 6.9 7.5 5.6  Hemoglobin 12.0 - 16.0 g/dL 11.8(L) 13.2 12.9  Hematocrit 35.0 - 47.0 % 33.8(L) 38.3 37.4  Platelets 150 - 440 K/uL 146(L) 165 189.0    BMET BMP Latest Ref Rng & Units 11/22/2017 11/21/2017 03/28/2017  Glucose 65 - 99 mg/dL 119(H) 147(H) 86  BUN 6 - 20 mg/dL 23(H) 27(H) 22  Creatinine 0.44 - 1.00 mg/dL 0.66 0.71 0.77  Sodium 135  - 145 mmol/L 139 140 141  Potassium 3.5 - 5.1 mmol/L 3.1(L) 3.5 3.7  Chloride 101 - 111 mmol/L 103 106 104  CO2 22 - 32 mmol/L '27 26 30  ' Calcium 8.9 - 10.3 mg/dL 8.8(L) 9.0 10.0    LFT Hepatic Function Latest Ref Rng & Units 11/22/2017 11/21/2017 11/21/2017  Total Protein 6.5 - 8.1 g/dL 6.0(L) 6.5 6.5  Albumin 3.5 - 5.0 g/dL 3.6 3.9 4.0  AST 15 - 41 U/L 639(H) 444(H) 430(H)  ALT 14 - 54 U/L 692(H) 234(H) 234(H)  Alk Phosphatase 38 - 126 U/L 240(H) 244(H) 265(H)  Total Bilirubin 0.3 - 1.2 mg/dL 5.0(H) 2.4(H) 2.3(H)  Bilirubin, Direct 0.1 - 0.5 mg/dL 3.0(H) 1.3(H) -     STUDIES: Ct Abdomen Pelvis W Contrast  Result Date: 11/21/2017 CLINICAL DATA:  Upper abdominal pain. EXAM: CT ABDOMEN AND PELVIS WITH CONTRAST TECHNIQUE: Multidetector CT imaging of the abdomen and pelvis was performed using the standard protocol following bolus administration of intravenous contrast. CONTRAST:  147m ISOVUE-300 IOPAMIDOL (ISOVUE-300) INJECTION 61% COMPARISON:  Right upper quadrant abdominal ultrasound earlier today. CT of the abdomen and pelvis without contrast on 08/14/2015. FINDINGS: Lower chest: No acute abnormality. Hepatobiliary: 1.8 cm caudate and 1.6 cm inferior right lobe hepatic cysts have a benign appearance. Status post cholecystectomy with dilatation of the common bile duct up to 10 mm near the liver. The common bile duct appears tortuous. On the coronal reconstructions, there is suggestion of some potential subtle filling defects in the posterior aspect of the common bile duct. However, this may be secondary to bile duct tortuosity rather than choledocholithiasis. Correlation suggested with liver function tests and symptoms. No evidence of intrahepatic biliary ductal dilatation. Pancreas: Unremarkable. No pancreatic ductal dilatation or surrounding inflammatory changes. Spleen: Normal in size without focal abnormality. Adrenals/Urinary Tract: Adrenal glands are unremarkable. Stable bilateral renal  cysts. 7 mm calculus in the left lower kidney. No hydronephrosis. Bladder is unremarkable. Stomach/Bowel: Small hiatal hernia present. No evidence of bowel obstruction or inflammation. No lesions identified. Diverticulosis of the sigmoid colon present without evidence of diverticulitis. Vascular/Lymphatic: No significant vascular findings are present. No enlarged abdominal or pelvic lymph nodes. Reproductive: Status post hysterectomy. No adnexal masses. Other: No abdominal wall hernia or abnormality. No abdominopelvic ascites. Musculoskeletal: No acute or significant osseous findings. IMPRESSION: Dilated extrahepatic bile ducts measuring up to 10 mm without intrahepatic biliary dilatation. This may be secondary to cholecystectomy, but there may be some subtle filling defects in the common bile duct which also appears tortuous. Correlation suggested with liver function tests. Further evaluation with MRI/MRCP versus ERCP may be helpful if there is clinical suspicion of choledocholithiasis. Electronically Signed   By: GAletta EdouardM.D.   On: 11/21/2017 18:25   [place]domen Limited Ruq  Result Date: 11/21/2017 CLINICAL DATA:  Epigastric abdominal pain. EXAM: ULTRASOUND ABDOMEN LIMITED RIGHT UPPER QUADRANT COMPARISON:  Body CT 08/14/2015 FINDINGS: Gallbladder: Surgically absent. Common bile duct: Diameter: 8 mm Liver: Within normal limits in parenchymal echogenicity. There is a hypoechoic circumscribed mass in the inferior pole of the right lobe of the liver measuring 1.5 x 1.6 x 1.6 cm. When compared to body CT dated October 2016, when it measured 1.3 cm in diameter, this lesion has enlarged. Portal vein is patent on color Doppler imaging with normal direction of blood flow towards the liver. IMPRESSION: Mildly enlarged hypoechoic lesion in the right hepatic lobe, measuring 1.6 cm. The water density and lack of internal blood flow suggest a benign lesion. Electronically Signed   By: DFidela SalisburyM.D.   On:  11/21/2017 17:24      Impression / Plan:   Dominique BORELis a 83y.o. female with history of hypertension, status post cholecystectomy 2005 secondary to cholelithiasis presents with acute onset of epigastric pain, biochemical and imaging features are consistent with choledocholithiasis. She does not have gallstone pancreatitis or acute cholangitis.  - Maintain nothing by mouth  - ERCP today by Dr. WAllen Norris- Check PT/INR - Monitor LFTs daily - Monitor closely for signs of cholangitis  I have discussed alternative options, risks & benefits,  which include, but are not limited to, bleeding, infection, perforation,respiratory complication & drug reaction.  The patient agrees with this plan & written consent will be obtained.  Thank you for involving me in the care of this patient.      LOS: 0 days   Sherri Sear, MD  11/22/2017, 2:29 PM   Note: This dictation was prepared with Dragon dictation along with smaller phrase technology. Any transcriptional errors that result from this process are unintentional.

## 2017-11-22 NOTE — Progress Notes (Signed)
[facility] [place] NAME: Dominique Hall    MR#:  [id number]  DATE OF BIRTH:  06/05/1935  SUBJECTIVE:   Patient here with abdominal pain NPO right now Waiting for GI consultant  REVIEW OF SYSTEMS:    Review of Systems  Constitutional: Negative for fever, chills weight loss HENT: Negative for ear pain, nosebleeds, congestion, facial swelling, rhinorrhea, neck pain, neck stiffness and ear discharge.   Respiratory: Negative for cough, shortness of breath, wheezing  Cardiovascular: Negative for chest pain, palpitations and leg swelling.  Gastrointestinal: Negative for heartburn, positive abdominal pain, NO vomiting, diarrhea or consitpation Genitourinary: Negative for dysuria, urgency, frequency, hematuria Musculoskeletal: Negative for back pain or joint pain Neurological: Negative for dizziness, seizures, syncope, focal weakness,  numbness and headaches.  Hematological: Does not bruise/bleed easily.  Psychiatric/Behavioral: Negative for hallucinations, confusion, dysphoric mood    Tolerating Diet: NPO      DRUG ALLERGIES:   Allergies  Allergen Reactions  . Penicillins     REACTION: unspecified  . Rabeprazole Sodium     REACTION: constipated  . Sulfonamide Derivatives     REACTION: unspecified  . Cefuroxime     diarrhea    VITALS:  Blood pressure (!) 124/41, pulse 71, temperature 98.3 F (36.8 C), temperature source Oral, resp. rate 19, weight 69.9 kg (154 lb), SpO2 94 %.  PHYSICAL EXAMINATION:  Constitutional: Appears well-developed and well-nourished. No distress. HENT: Normocephalic. Marland Kitchen Oropharynx is clear and moist.  Eyes: Conjunctivae and EOM are normal. PERRLA, no scleral icterus.  Neck: Normal ROM. Neck supple. No JVD. No tracheal deviation. CVS: RRR, S1/S2 +, no murmurs, no gallops, no carotid bruit.  Pulmonary: Effort and breath sounds normal, no stridor, rhonchi, wheezes, rales.  Abdominal: Soft. BS +,  RUQ  tenderness no rebound or guarding.  Musculoskeletal: Normal range of motion. No edema and no tenderness.  Neuro: Alert. CN 2-12 grossly intact. No focal deficits. Skin: Skin is warm and dry. No rash noted. Psychiatric: Normal mood and affect.      LABORATORY PANEL:   CBC Recent Labs  Lab 11/22/17 0335  WBC 6.9  HGB 11.8*  HCT 33.8*  PLT 146*   ------------------------------------------------------------------------------------------------------------------  Chemistries  Recent Labs  Lab 11/22/17 0335  NA 139  K 3.1*  CL 103  CO2 27  GLUCOSE 119*  BUN 23*  CREATININE 0.66  CALCIUM 8.8*  AST 639*  ALT 692*  ALKPHOS 240*  BILITOT 5.0*   ------------------------------------------------------------------------------------------------------------------  Cardiac Enzymes No results for input(s): TROPONINI in the last 168 hours. ------------------------------------------------------------------------------------------------------------------  RADIOLOGY:  Ct Abdomen Pelvis W Contrast  Result Date: 11/21/2017 CLINICAL DATA:  Upper abdominal pain. EXAM: CT ABDOMEN AND PELVIS WITH CONTRAST TECHNIQUE: Multidetector CT imaging of the abdomen and pelvis was performed using the standard protocol following bolus administration of intravenous contrast. CONTRAST:  123mL ISOVUE-300 IOPAMIDOL (ISOVUE-300) INJECTION 61% COMPARISON:  Right upper quadrant abdominal ultrasound earlier today. CT of the abdomen and pelvis without contrast on 08/14/2015. FINDINGS: Lower chest: No acute abnormality. Hepatobiliary: 1.8 cm caudate and 1.6 cm inferior right lobe hepatic cysts have a benign appearance. Status post cholecystectomy with dilatation of the common bile duct up to 10 mm near the liver. The common bile duct appears tortuous. On the coronal reconstructions, there is suggestion of some potential subtle filling defects in the posterior aspect of the common bile duct. However, this may be  secondary to bile duct tortuosity rather than choledocholithiasis. Correlation suggested with liver function  tests and symptoms. No evidence of intrahepatic biliary ductal dilatation. Pancreas: Unremarkable. No pancreatic ductal dilatation or surrounding inflammatory changes. Spleen: Normal in size without focal abnormality. Adrenals/Urinary Tract: Adrenal glands are unremarkable. Stable bilateral renal cysts. 7 mm calculus in the left lower kidney. No hydronephrosis. Bladder is unremarkable. Stomach/Bowel: Small hiatal hernia present. No evidence of bowel obstruction or inflammation. No lesions identified. Diverticulosis of the sigmoid colon present without evidence of diverticulitis. Vascular/Lymphatic: No significant vascular findings are present. No enlarged abdominal or pelvic lymph nodes. Reproductive: Status post hysterectomy. No adnexal masses. Other: No abdominal wall hernia or abnormality. No abdominopelvic ascites. Musculoskeletal: No acute or significant osseous findings. IMPRESSION: Dilated extrahepatic bile ducts measuring up to 10 mm without intrahepatic biliary dilatation. This may be secondary to cholecystectomy, but there may be some subtle filling defects in the common bile duct which also appears tortuous. Correlation suggested with liver function tests. Further evaluation with MRI/MRCP versus ERCP may be helpful if there is clinical suspicion of choledocholithiasis. Electronically Signed   By: Aletta Edouard M.D.   On: 11/21/2017 18:25   US Abdomen Limited Ruq  Result Date: 11/21/2017 CLINICAL DATA:  Epigastric abdominal pain. EXAM: ULTRASOUND ABDOMEN LIMITED RIGHT UPPER QUADRANT COMPARISON:  Body CT 08/14/2015 FINDINGS: Gallbladder: Surgically absent. Common bile duct: Diameter: 8 mm Liver: Within normal limits in parenchymal echogenicity. There is a hypoechoic circumscribed mass in the inferior pole of the right lobe of the liver measuring 1.5 x 1.6 x 1.6 cm. When compared to body CT  dated October 2016, when it measured 1.3 cm in diameter, this lesion has enlarged. Portal vein is patent on color Doppler imaging with normal direction of blood flow towards the liver. IMPRESSION: Mildly enlarged hypoechoic lesion in the right hepatic lobe, measuring 1.6 cm. The water density and lack of internal blood flow suggest a benign lesion. Electronically Signed   By: Fidela Salisbury M.D.   On: 11/21/2017 17:24     ASSESSMENT AND PLAN:    83 year old female with history of essential hypertension who presented to the emergency room complaining of abdominal pain and was found to have elevated liver function tests.  1. Acute abdominal pain with CT scan showing Dilated extrahepatic bile ducts measuring up to 10 mm without intrahepatic biliary dilatation.  I have called GI and waiting to hear about plan with possible ERCP today. Continue nothing by mouth status Start IV fluids When necessary pain medications  2. Elevated LFTs: Liver function tests are increasing Patient will most likely need ERCP or MRCP Await GI consult and for further recommendations  3. Essential hypertension: Continue HCTZ  4. Anxiety: Continue Tranxene  5. Hypokalemia: Replete and recheck in a.m.  6. GERD: Continue PPI    Management plans discussed with the patient and she is in agreement.  CODE STATUS: full  TOTAL TIME TAKING CARE OF THIS PATIENT: 30 minutes.     POSSIBLE D/C 2 days, DEPENDING ON CLINICAL CONDITION.   Nohelia Valenza M.D on 11/22/2017 at 10:17 AM  Between 7am to 6pm - Pager - [phone] After 6pm go to [email or web address] - password [email or web address] [facility]  Office  [phone]  CC: Primary care physician; Venia Carbon, MD  Note: This dictation was prepared with Dragon dictation along with smaller phrase technology. Any transcriptional errors that result from this process are unintentional.

## 2017-11-22 NOTE — Anesthesia Post-op Follow-up Note (Signed)
Anesthesia QCDR form completed.        

## 2017-11-22 NOTE — Anesthesia Postprocedure Evaluation (Signed)
Anesthesia Post Note  Patient: Dominique Hall  Procedure(s) Performed: ENDOSCOPIC RETROGRADE CHOLANGIOPANCREATOGRAPHY (ERCP) (N/A )  Patient location during evaluation: Endoscopy Anesthesia Type: General Level of consciousness: awake and alert Pain management: pain level controlled Vital Signs Assessment: post-procedure vital signs reviewed and stable Respiratory status: spontaneous breathing, nonlabored ventilation, respiratory function stable and patient connected to nasal cannula oxygen Cardiovascular status: blood pressure returned to baseline and stable Postop Assessment: no apparent nausea or vomiting Anesthetic complications: no     Last Vitals:  Vitals:   11/22/17 1436 11/22/17 1453  BP: (!) 150/64 (!) 148/64  Pulse: 71 66  Resp: 19 17  Temp: 36.6 C   SpO2: 100% 100%    Last Pain:  Vitals:   11/22/17 1436  TempSrc:   PainSc: 0-No pain                 Elnor Renovato S

## 2017-11-22 NOTE — Transfer of Care (Signed)
Immediate Anesthesia Transfer of Care Note  Patient: CHAUNTE HORNBECK  Procedure(s) Performed: ENDOSCOPIC RETROGRADE CHOLANGIOPANCREATOGRAPHY (ERCP) (N/A )  Patient Location: PACU  Anesthesia Type:General  Level of Consciousness: awake, drowsy and patient cooperative  Airway & Oxygen Therapy: Patient Spontanous Breathing and Patient connected to nasal cannula oxygen  Post-op Assessment: Report given to RN and Post -op Vital signs reviewed and stable  Post vital signs: Reviewed and stable  Last Vitals:  Vitals:   11/22/17 1217 11/22/17 1436  BP: 101/66 (!) 150/64  Pulse: 73 71  Resp: 17 19  Temp: 36.8 C 36.6 C  SpO2: 96% 100%    Last Pain:  Vitals:   11/22/17 1436  TempSrc:   PainSc: 0-No pain         Complications: No apparent anesthesia complications

## 2017-11-22 NOTE — Op Note (Signed)
[facility] Patient Name: Dominique Hall Procedure Date: 11/22/2017 1:24 PM MRN: [id number] Account #: [id number] Date of Birth: 1934/11/18 Admit Type: Inpatient Age: 83 Room[facility] ENDO ROOM 4 Gender: Female Note Status: Finalized Procedure:            ERCP Indications:          Jaundice, Elevated liver enzymes Providers:            Lucilla Lame MD, MD Referring MD:         Venia Carbon (Referring MD) Medicines:            General Anesthesia Complications:        No immediate complications. Procedure:            Pre-Anesthesia Assessment:                       - Prior to the procedure, a History and Physical was                        performed, and patient medications and allergies were                        reviewed. The patient's tolerance of previous                        anesthesia was also reviewed. The risks and benefits of                        the procedure and the sedation options and risks were                        discussed with the patient. All questions were                        answered, and informed consent was obtained. Prior                        Anticoagulants: The patient has taken no previous                        anticoagulant or antiplatelet agents. ASA Grade                        Assessment: II - A patient with mild systemic disease.                        After reviewing the risks and benefits, the patient was                        deemed in satisfactory condition to undergo the                        procedure.                       After obtaining informed consent, the scope was passed                        under direct vision. Throughout the procedure, the  patient's blood pressure, pulse, and oxygen saturations                        were monitored continuously. The ERCP was introduced                        through the mouth, and used to inject contrast into and         used to inject contrast into the bile duct. The ERCP                        was accomplished without difficulty. The patient                        tolerated the procedure well. Findings:      A scout film of the abdomen was obtained. Surgical clips, consistent       with a previous cholecystectomy, were seen in the area of the right       upper quadrant of the abdomen. The major papilla was located entirely       within a diverticulum. The bile duct was deeply cannulated with the       short-nosed traction sphincterotome. Contrast was injected. I personally       interpreted the bile duct images. There was brisk flow of contrast       through the ducts. Image quality was excellent. Contrast extended to the       entire biliary tree. The main bile duct was diffusely dilated, acquired.       A wire was passed into the biliary tree. A 10 mm biliary sphincterotomy       was made with a traction (standard) sphincterotome using ERBE       electrocautery. There was no post-sphincterotomy bleeding. The biliary       tree was swept with a 15 mm balloon starting at the bifurcation. Sludge       was swept from the duct. Impression:           - The major papilla was located entirely within a                        diverticulum.                       - The entire main bile duct was dilated, acquired.                       - A biliary sphincterotomy was performed.                       - The biliary tree was swept and sludge was found. Recommendation:       - Return patient to hospital ward for ongoing care.                       - Watch for pancreatitis, bleeding, perforation, and                        cholangitis.                       - Continue present medications. Procedure Code(s):    --- Professional ---  [phone], Endoscopic retrograde cholangiopancreatography                        (ERCP); with removal of calculi/debris from                        biliary/pancreatic  duct(s)                       43262, Endoscopic retrograde cholangiopancreatography                        (ERCP); with sphincterotomy/papillotomy                       [phone], Endoscopic catheterization of the biliary ductal                        system, radiological supervision and interpretation Diagnosis Code(s):    --- Professional ---                       R17, Unspecified jaundice                       R74.8, Abnormal levels of other serum enzymes                       K83.8, Other specified diseases of biliary tract CPT copyright 2016 [employer]. All rights reserved. The codes documented in this report are preliminary and upon coder review may  be revised to meet current compliance requirements. Lucilla Lame MD, MD 11/22/2017 2:29:01 PM This report has been signed electronically. Number of Addenda: 0 Note Initiated On: 11/22/2017 1:24 PM      [facility]

## 2017-11-22 NOTE — Anesthesia Preprocedure Evaluation (Addendum)
Anesthesia Evaluation  Patient identified by MRN, date of birth, ID band Patient awake    Reviewed: Allergy & Precautions, NPO status , Patient's Chart, lab work & pertinent test results, reviewed documented beta blocker date and time   Airway Mallampati: II  TM Distance: >3 FB     Dental  (+) Chipped, Missing   Pulmonary former smoker,           Cardiovascular hypertension, Pt. on medications      Neuro/Psych PSYCHIATRIC DISORDERS Anxiety  Neuromuscular disease    GI/Hepatic hiatal hernia, GERD  ,  Endo/Other    Renal/GU Renal disease     Musculoskeletal  (+) Arthritis ,   Abdominal   Peds  Hematology   Anesthesia Other Findings K 3.1.  Reproductive/Obstetrics                            Anesthesia Physical Anesthesia Plan  ASA: III  Anesthesia Plan: General   Post-op Pain Management:    Induction: Intravenous  PONV Risk Score and Plan:   Airway Management Planned: Oral ETT  Additional Equipment:   Intra-op Plan:   Post-operative Plan:   Informed Consent: I have reviewed the patients History and Physical, chart, labs and discussed the procedure including the risks, benefits and alternatives for the proposed anesthesia with the patient or authorized representative who has indicated his/her understanding and acceptance.     Plan Discussed with: CRNA  Anesthesia Plan Comments:         Anesthesia Quick Evaluation

## 2017-11-23 LAB — COMPREHENSIVE METABOLIC PANEL
ALT: 406 U/L — AB (ref 14–54)
AST: 192 U/L — AB (ref 15–41)
Albumin: 3.3 g/dL — ABNORMAL LOW (ref 3.5–5.0)
Alkaline Phosphatase: 205 U/L — ABNORMAL HIGH (ref 38–126)
Anion gap: 9 (ref 5–15)
BILIRUBIN TOTAL: 4.9 mg/dL — AB (ref 0.3–1.2)
BUN: 23 mg/dL — ABNORMAL HIGH (ref 6–20)
CALCIUM: 8.6 mg/dL — AB (ref 8.9–10.3)
CO2: 25 mmol/L (ref 22–32)
CREATININE: 0.7 mg/dL (ref 0.44–1.00)
Chloride: 105 mmol/L (ref 101–111)
GFR calc Af Amer: >60 mL/min (ref >60)
Glucose, Bld: 81 mg/dL (ref 65–99)
POTASSIUM: 3.3 mmol/L — AB (ref 3.5–5.1)
Sodium: 139 mmol/L (ref 135–145)
TOTAL PROTEIN: 5.9 g/dL — AB (ref 6.5–8.1)

## 2017-11-23 MED ORDER — POTASSIUM CHLORIDE CRYS ER 20 MEQ PO TBCR
40.0000 meq | EXTENDED_RELEASE_TABLET | Freq: Once | ORAL | Status: AC
  Administered 2017-11-23: 40 meq via ORAL
  Filled 2017-11-23: qty 2

## 2017-11-23 NOTE — Discharge Summary (Signed)
[place] at [facility], 83 y.o., DOB Apr 05, 1935, MRN [id number]. Admission date: 11/21/2017 Discharge Date 11/23/2017 Primary MD Venia Carbon, MD Admitting Physician Loletha Grayer, MD  Admission Diagnosis  Epigastric abdominal pain [R10.13] Common bile duct dilation [K83.8]  Discharge Diagnosis   Active Problems:   Elevated LFTs   Abdominal pain   Jaundice   Common bile duct dilation   Essential hypertension   Anxiety   Hypokalemia   gerd          Hospital Course  Patient is a 83 year old female with history of essential hypertension presented to the emergency room complaining of abdominal pain and was found to have elevated liver function tests.  She had a CT scan which showed dilated common bile duct.  Therefore GI consult was obtained.  Patient underwent the ERCP and had sphincterectomy done.  Her liver function tests are trending down.  She is very anxious to go home she needs outpatient GI follow-up with repeat LFTs.  And follow-up on rest of the blood work.            Consults  GI  Significant Tests:  See full reports for all details     Ct Abdomen Pelvis W Contrast  Result Date: 11/21/2017 CLINICAL DATA:  Upper abdominal pain. EXAM: CT ABDOMEN AND PELVIS WITH CONTRAST TECHNIQUE: Multidetector CT imaging of the abdomen and pelvis was performed using the standard protocol following bolus administration of intravenous contrast. CONTRAST:  193mL ISOVUE-300 IOPAMIDOL (ISOVUE-300) INJECTION 61% COMPARISON:  Right upper quadrant abdominal ultrasound earlier today. CT of the abdomen and pelvis without contrast on 08/14/2015. FINDINGS: Lower chest: No acute abnormality. Hepatobiliary: 1.8 cm caudate and 1.6 cm inferior right lobe hepatic cysts have a benign appearance. Status post cholecystectomy with dilatation of the common bile duct up to 10 mm near the liver. The common bile duct appears tortuous. On the coronal  reconstructions, there is suggestion of some potential subtle filling defects in the posterior aspect of the common bile duct. However, this may be secondary to bile duct tortuosity rather than choledocholithiasis. Correlation suggested with liver function tests and symptoms. No evidence of intrahepatic biliary ductal dilatation. Pancreas: Unremarkable. No pancreatic ductal dilatation or surrounding inflammatory changes. Spleen: Normal in size without focal abnormality. Adrenals/Urinary Tract: Adrenal glands are unremarkable. Stable bilateral renal cysts. 7 mm calculus in the left lower kidney. No hydronephrosis. Bladder is unremarkable. Stomach/Bowel: Small hiatal hernia present. No evidence of bowel obstruction or inflammation. No lesions identified. Diverticulosis of the sigmoid colon present without evidence of diverticulitis. Vascular/Lymphatic: No significant vascular findings are present. No enlarged abdominal or pelvic lymph nodes. Reproductive: Status post hysterectomy. No adnexal masses. Other: No abdominal wall hernia or abnormality. No abdominopelvic ascites. Musculoskeletal: No acute or significant osseous findings. IMPRESSION: Dilated extrahepatic bile ducts measuring up to 10 mm without intrahepatic biliary dilatation. This may be secondary to cholecystectomy, but there may be some subtle filling defects in the common bile duct which also appears tortuous. Correlation suggested with liver function tests. Further evaluation with MRI/MRCP versus ERCP may be helpful if there is clinical suspicion of choledocholithiasis. Electronically Signed   By: Aletta Edouard M.D.   On: 11/21/2017 18:25   Dg Ercp  Result Date: 11/22/2017 CLINICAL DATA:  83 year old female with a history common duct stone EXAM: ERCP TECHNIQUE: Multiple spot images obtained with the fluoroscopic device and submitted for interpretation post-procedure. FLUOROSCOPY TIME:  Fluoroscopy Time:  1 minute 50 seconds COMPARISON:  CT  11/21/2017 FINDINGS: Limited intraoperative fluoroscopic spot images of ERCP. Images demonstrate endoscope within the upper abdomen with partial opacification of the extrahepatic biliary ducts and deployment of a retrieval balloon. IMPRESSION: Limited images during ERCP demonstrates partial opacification of the extrahepatic biliary ducts with deployment of a retrieval balloon for treatment of choledocholithiasis. Please refer to the dictated operative report for full details of intraoperative findings and procedure. Electronically Signed   By: Corrie Mckusick D.O.   On: 11/22/2017 15:03   US Abdomen Limited Ruq  Result Date: 11/21/2017 CLINICAL DATA:  Epigastric abdominal pain. EXAM: ULTRASOUND ABDOMEN LIMITED RIGHT UPPER QUADRANT COMPARISON:  Body CT 08/14/2015 FINDINGS: Gallbladder: Surgically absent. Common bile duct: Diameter: 8 mm Liver: Within normal limits in parenchymal echogenicity. There is a hypoechoic circumscribed mass in the inferior pole of the right lobe of the liver measuring 1.5 x 1.6 x 1.6 cm. When compared to body CT dated October 2016, when it measured 1.3 cm in diameter, this lesion has enlarged. Portal vein is patent on color Doppler imaging with normal direction of blood flow towards the liver. IMPRESSION: Mildly enlarged hypoechoic lesion in the right hepatic lobe, measuring 1.6 cm. The water density and lack of internal blood flow suggest a benign lesion. Electronically Signed   By: Fidela Salisbury M.D.   On: 11/21/2017 17:24       Today   Subjective:   Dominique Hall feeling much better wants to go home  Objective:   Blood pressure (!) 120/41, pulse 61, temperature 98.1 F (36.7 C), resp. rate 17, weight 154 lb (69.9 kg), SpO2 95 %.  .  Intake/Output Summary (Last 24 hours) at 11/23/2017 1651 Last data filed at 11/23/2017 1011 Gross per 24 hour  Intake 1205.33 ml  Output 500 ml  Net 705.33 ml    Exam VITAL SIGNS: Blood pressure (!) 120/41, pulse 61,  temperature 98.1 F (36.7 C), resp. rate 17, weight 154 lb (69.9 kg), SpO2 95 %.  GENERAL:  83 y.o.-year-old patient lying in the bed with no acute distress.  EYES: Pupils equal, round, reactive to light and accommodation. No scleral icterus. Extraocular muscles intact.  HEENT: Head atraumatic, normocephalic. Oropharynx and nasopharynx clear.  NECK:  Supple, no jugular venous distention. No thyroid enlargement, no tenderness.  LUNGS: Normal breath sounds bilaterally, no wheezing, rales,rhonchi or crepitation. No use of accessory muscles of respiration.  CARDIOVASCULAR: S1, S2 normal. No murmurs, rubs, or gallops.  ABDOMEN: Soft, nontender, nondistended. Bowel sounds present. No organomegaly or mass.  EXTREMITIES: No pedal edema, cyanosis, or clubbing.  NEUROLOGIC: Cranial nerves II through XII are intact. Muscle strength 5/5 in all extremities. Sensation intact. Gait not checked.  PSYCHIATRIC: The patient is alert and oriented x 3.  SKIN: No obvious rash, lesion, or ulcer.   Data Review     CBC w Diff:  Lab Results  Component Value Date   WBC 6.9 11/22/2017   HGB 11.8 (L) 11/22/2017   HCT 33.8 (L) 11/22/2017   PLT 146 (L) 11/22/2017   LYMPHOPCT 19.6 03/28/2017   MONOPCT 6.8 03/28/2017   EOSPCT 4.4 03/28/2017   BASOPCT 0.3 03/28/2017   CMP:  Lab Results  Component Value Date   NA 139 11/23/2017   K 3.3 (L) 11/23/2017   CL 105 11/23/2017   CO2 25 11/23/2017   BUN 23 (H) 11/23/2017   CREATININE 0.70 11/23/2017   PROT 5.9 (L) 11/23/2017   ALBUMIN 3.3 (L) 11/23/2017   BILITOT 4.9 (H) 11/23/2017   ALKPHOS  205 (H) 11/23/2017   AST 192 (H) 11/23/2017   ALT 406 (H) 11/23/2017  .  Micro Results No results found for this or any previous visit (from the past 240 hour(s)).   Code Status History    Date Active Date Inactive Code Status Order ID Comments User Context   11/21/2017 21:08 11/23/2017 15:00 Full Code [id number]  Loletha Grayer, MD ED    Advance Directive  Documentation     Most Recent Value  Type of Advance Directive  Healthcare Power of Attorney, Living will  Pre-existing out of facility DNR order (yellow form or pink MOST form)  No data  "MOST" Form in Place?  No data          Follow-up Information    Lin Landsman, MD Follow up in 3 day(s).   Specialty:  Gastroenterology Why:  check lft and f/u  Contact information: [place] [address] [phone]        [name] I, MD Follow up in 1 week(s).   Specialties:  Internal Medicine, Pediatrics Why:  hospital follow up Contact information: [place] [address] [phone]           Discharge Medications   Allergies as of 11/23/2017      Reactions   Penicillins    REACTION: unspecified   Rabeprazole Sodium    REACTION: constipated   Sulfonamide Derivatives    REACTION: unspecified   Cefuroxime    diarrhea      Medication List    TAKE these medications   clorazepate 7.5 MG tablet Commonly known as:  TRANXENE Take 1 to 2 tablets at bedtime as needed.   estradiol 0.1 MG/GM vaginal cream Commonly known as:  ESTRACE Place 1 Applicatorful vaginally 3 (three) times a week.   FISH OIL PO Take 1 capsule by mouth daily.   hydrochlorothiazide 12.5 MG capsule Commonly known as:  MICROZIDE TAKE 1 CAPSULE EVERY       MORNING   meloxicam 7.5 MG tablet Commonly known as:  MOBIC TAKE 1 TABLET BY MOUTH EVERY DAY   multivitamin tablet Take 1 tablet by mouth daily.   NEXIUM 40 MG capsule Generic drug:  esomeprazole Take 1 capsule (40 mg total) by mouth daily.   vitamin C 500 MG tablet Commonly known as:  ASCORBIC ACID Take 500 mg by mouth daily.   ZANTAC 150 MG tablet Generic drug:  ranitidine Take 1 tablet (150 mg total) by mouth at bedtime.   ZOFRAN 4 MG tablet Generic drug:  ondansetron Take 1 tablet (4 mg total) by mouth every 8 (eight) hours as needed for nausea or vomiting.           Total Time in preparing paper work, data evaluation and todays exam - 35 minutes  Dustin Flock M.D on 11/23/2017 at 4:51 PM  [facility]   Office  [phone]

## 2017-11-23 NOTE — Progress Notes (Signed)
Pt discharged per MD order. IV removed. Discharge instructions reviewed with pt. Pt and husbands questions answered to their satisfaction. Pt taken to car in wheelchair by staff.

## 2017-11-23 NOTE — Discharge Instructions (Signed)
[place] at [place]:  Cardiac diet  DISCHARGE CONDITION:  Stable  ACTIVITY:  Activity as tolerated  OXYGEN:  Home Oxygen: No.   Oxygen Delivery: room air  DISCHARGE LOCATION:  home    ADDITIONAL DISCHARGE INSTRUCTION: advance diet to full liquid for lunch then low fat for dinner   If you experience worsening of your admission symptoms, develop shortness of breath, life threatening emergency, suicidal or homicidal thoughts you must seek medical attention immediately by calling 911 or calling your MD immediately  if symptoms less severe.  You Must read complete instructions/literature along with all the possible adverse reactions/side effects for all the Medicines you take and that have been prescribed to you. Take any new Medicines after you have completely understood and accpet all the possible adverse reactions/side effects.   Please note  You were cared for by a hospitalist during your hospital stay. If you have any questions about your discharge medications or the care you received while you were in the hospital after you are discharged, you can call the unit and asked to speak with the hospitalist on call if the hospitalist that took care of you is not available. Once you are discharged, your primary care physician will handle any further medical issues. Please note that NO REFILLS for any discharge medications will be authorized once you are discharged, as it is imperative that you return to your primary care physician (or establish a relationship with a primary care physician if you do not have one) for your aftercare needs so that they can reassess your need for medications and monitor your lab values.

## 2017-11-25 ENCOUNTER — Encounter: Payer: Self-pay | Admitting: Gastroenterology

## 2017-11-25 ENCOUNTER — Telehealth: Payer: Self-pay

## 2017-11-25 NOTE — Telephone Encounter (Signed)
Transitional Care Management Follow-up Telephone Call    Date discharged? 11/23/2017  How have you been since you were released from the hospital? [place]. Generalized weakness.   Any patient concerns? None at this time.    Items Reviewed:  Medications reviewed: Yes, no changes made to medications.   Allergies reviewed: Yes  Dietary changes reviewed: N/A, no dietary changes noted on discharge summary  Referrals reviewed: Yes, future GI appt has been scheduled.    Functional Questionnaire:  Independent - I Dependent - D    Activities of Daily Living (ADLs):    Personal hygiene - I Dressing - I Eating - I Maintaining continence - I Transferring - I   Independent Activities of Daily Living (iADLs): Basic communication skills - I Transportation - D, temporarily not driving at this time Meal preparation  - I Shopping - I Housework - I Managing medications - I  Managing personal finances - I   Confirmed importance and date/time of follow-up visits scheduled YES  Provider Appointment booked with PCP 11/28/17 @ 1600  Confirmed with patient if condition begins to worsen call PCP or go to the ER.  Patient was given the office number and encouraged to call back with question or concerns: YES, patient verbalized phone number to [facility]

## 2017-11-27 ENCOUNTER — Ambulatory Visit (INDEPENDENT_AMBULATORY_CARE_PROVIDER_SITE_OTHER): Payer: Medicare Other | Admitting: Gastroenterology

## 2017-11-27 VITALS — BP 113/61 | HR 80 | Ht 64.0 in | Wt 149.5 lb

## 2017-11-27 DIAGNOSIS — R748 Abnormal levels of other serum enzymes: Secondary | ICD-10-CM | POA: Diagnosis not present

## 2017-11-27 NOTE — Progress Notes (Signed)
Primary Care Physician: Venia Carbon, MD  Primary Gastroenterologist:  Dr. Lucilla Lame  No chief complaint on file.   HPI: Dominique Hall is a 83 y.o. female here for follow-up after having a dilated common bile duct with a periampullar diverticulum. The patient had an ERCP with sphincterotomy. The patient's liver enzymes had decreased the following day and the patient was sent home. She is now here for follow-up after being discharged from the hospital. The patient reports that she has been feeling very well and no problems since discharge. She also states that her stools are not light and her urine is not dark anymore.  Current Outpatient Medications  Medication Sig Dispense Refill  . clorazepate (TRANXENE) 7.5 MG tablet Take 1 to 2 tablets at bedtime as needed. 60 tablet 0  . estradiol (ESTRACE) 0.1 MG/GM vaginal cream Place 1 Applicatorful vaginally 3 (three) times a week. 42.5 g 1  . hydrochlorothiazide (MICROZIDE) 12.5 MG capsule TAKE 1 CAPSULE EVERY       MORNING 90 capsule 3  . meloxicam (MOBIC) 7.5 MG tablet TAKE 1 TABLET BY MOUTH EVERY DAY 30 tablet 5  . Multiple Vitamin (MULTIVITAMIN) tablet Take 1 tablet by mouth daily.    Marland Kitchen NEXIUM 40 MG capsule Take 1 capsule (40 mg total) by mouth daily. 90 capsule 3  . Omega-3 Fatty Acids (FISH OIL PO) Take 1 capsule by mouth daily.    . vitamin C (ASCORBIC ACID) 500 MG tablet Take 500 mg by mouth daily.    Marland Kitchen ZANTAC 150 MG tablet Take 1 tablet (150 mg total) by mouth at bedtime. 90 tablet 3  . ZOFRAN 4 MG tablet Take 1 tablet (4 mg total) by mouth every 8 (eight) hours as needed for nausea or vomiting. 20 tablet 0   No current facility-administered medications for this visit.     Allergies as of 11/27/2017 - Review Complete 11/25/2017  Allergen Reaction Noted  . Penicillins  10/31/2006  . Rabeprazole sodium  10/31/2006  . Sulfonamide derivatives  10/31/2006  . Cefuroxime  10/30/2012    ROS:  General: Negative for  anorexia, weight loss, fever, chills, fatigue, weakness. ENT: Negative for hoarseness, difficulty swallowing , nasal congestion. CV: Negative for chest pain, angina, palpitations, dyspnea on exertion, peripheral edema.  Respiratory: Negative for dyspnea at rest, dyspnea on exertion, cough, sputum, wheezing.  GI: See history of present illness. GU:  Negative for dysuria, hematuria, urinary incontinence, urinary frequency, nocturnal urination.  Endo: Negative for unusual weight change.    Physical Examination:   There were no vitals taken for this visit.  General: Well-nourished, well-developed in no acute distress.  Eyes: No icterus. Conjunctivae pink. Mouth: Oropharyngeal mucosa moist and pink , no lesions erythema or exudate. Lungs: Clear to auscultation bilaterally. Non-labored. Heart: Regular rate and rhythm, no murmurs rubs or gallops.  Abdomen: Bowel sounds are normal, nontender, nondistended, no hepatosplenomegaly or masses, no abdominal bruits or hernia , no rebound or guarding.   Extremities: No lower extremity edema. No clubbing or deformities. Neuro: Alert and oriented x 3.  Grossly intact. Skin: Warm and dry, no jaundice.   Psych: Alert and cooperative, normal mood and affect.  Labs:    Imaging Studies: Ct Abdomen Pelvis W Contrast  Result Date: 11/21/2017 CLINICAL DATA:  Upper abdominal pain. EXAM: CT ABDOMEN AND PELVIS WITH CONTRAST TECHNIQUE: Multidetector CT imaging of the abdomen and pelvis was performed using the standard protocol following bolus administration of intravenous contrast. CONTRAST:  154mL  ISOVUE-300 IOPAMIDOL (ISOVUE-300) INJECTION 61% COMPARISON:  Right upper quadrant abdominal ultrasound earlier today. CT of the abdomen and pelvis without contrast on 08/14/2015. FINDINGS: Lower chest: No acute abnormality. Hepatobiliary: 1.8 cm caudate and 1.6 cm inferior right lobe hepatic cysts have a benign appearance. Status post cholecystectomy with dilatation of the  common bile duct up to 10 mm near the liver. The common bile duct appears tortuous. On the coronal reconstructions, there is suggestion of some potential subtle filling defects in the posterior aspect of the common bile duct. However, this may be secondary to bile duct tortuosity rather than choledocholithiasis. Correlation suggested with liver function tests and symptoms. No evidence of intrahepatic biliary ductal dilatation. Pancreas: Unremarkable. No pancreatic ductal dilatation or surrounding inflammatory changes. Spleen: Normal in size without focal abnormality. Adrenals/Urinary Tract: Adrenal glands are unremarkable. Stable bilateral renal cysts. 7 mm calculus in the left lower kidney. No hydronephrosis. Bladder is unremarkable. Stomach/Bowel: Small hiatal hernia present. No evidence of bowel obstruction or inflammation. No lesions identified. Diverticulosis of the sigmoid colon present without evidence of diverticulitis. Vascular/Lymphatic: No significant vascular findings are present. No enlarged abdominal or pelvic lymph nodes. Reproductive: Status post hysterectomy. No adnexal masses. Other: No abdominal wall hernia or abnormality. No abdominopelvic ascites. Musculoskeletal: No acute or significant osseous findings. IMPRESSION: Dilated extrahepatic bile ducts measuring up to 10 mm without intrahepatic biliary dilatation. This may be secondary to cholecystectomy, but there may be some subtle filling defects in the common bile duct which also appears tortuous. Correlation suggested with liver function tests. Further evaluation with MRI/MRCP versus ERCP may be helpful if there is clinical suspicion of choledocholithiasis. Electronically Signed   By: Aletta Edouard M.D.   On: 11/21/2017 18:25   Dg Ercp  Result Date: 11/22/2017 CLINICAL DATA:  83 year old female with a history common duct stone EXAM: ERCP TECHNIQUE: Multiple spot images obtained with the fluoroscopic device and submitted for interpretation  post-procedure. FLUOROSCOPY TIME:  Fluoroscopy Time:  1 minute 50 seconds COMPARISON:  CT 11/21/2017 FINDINGS: Limited intraoperative fluoroscopic spot images of ERCP. Images demonstrate endoscope within the upper abdomen with partial opacification of the extrahepatic biliary ducts and deployment of a retrieval balloon. IMPRESSION: Limited images during ERCP demonstrates partial opacification of the extrahepatic biliary ducts with deployment of a retrieval balloon for treatment of choledocholithiasis. Please refer to the dictated operative report for full details of intraoperative findings and procedure. Electronically Signed   By: Corrie Mckusick D.O.   On: 11/22/2017 15:03   US Abdomen Limited Ruq  Result Date: 11/21/2017 CLINICAL DATA:  Epigastric abdominal pain. EXAM: ULTRASOUND ABDOMEN LIMITED RIGHT UPPER QUADRANT COMPARISON:  Body CT 08/14/2015 FINDINGS: Gallbladder: Surgically absent. Common bile duct: Diameter: 8 mm Liver: Within normal limits in parenchymal echogenicity. There is a hypoechoic circumscribed mass in the inferior pole of the right lobe of the liver measuring 1.5 x 1.6 x 1.6 cm. When compared to body CT dated October 2016, when it measured 1.3 cm in diameter, this lesion has enlarged. Portal vein is patent on color Doppler imaging with normal direction of blood flow towards the liver. IMPRESSION: Mildly enlarged hypoechoic lesion in the right hepatic lobe, measuring 1.6 cm. The water density and lack of internal blood flow suggest a benign lesion. Electronically Signed   By: Fidela Salisbury M.D.   On: 11/21/2017 17:24    Assessment and Plan:   GRACEANNE GUIN is a 83 y.o. y/o female today with a history of being in the hospital recently for elevated  liver enzymes. The patient had a periampullary diverticulum with obstructive jaundice. The patient feels much better and had an ERCP with sphincterotomy. The patient will have her labs checked again today to see if the liver enzymes  have decreased her come back to normal. The patient has been explained the plan and agrees with it.    Lucilla Lame, MD. Marval Regal   Note: This dictation was prepared with Dragon dictation along with smaller phrase technology. Any transcriptional errors that result from this process are unintentional.

## 2017-11-28 ENCOUNTER — Ambulatory Visit (INDEPENDENT_AMBULATORY_CARE_PROVIDER_SITE_OTHER): Payer: Medicare Other | Admitting: Internal Medicine

## 2017-11-28 ENCOUNTER — Encounter: Payer: Self-pay | Admitting: Internal Medicine

## 2017-11-28 ENCOUNTER — Encounter: Payer: Self-pay | Admitting: Gastroenterology

## 2017-11-28 VITALS — BP 118/70 | HR 79 | Temp 98.2°F | Wt 149.2 lb

## 2017-11-28 DIAGNOSIS — K838 Other specified diseases of biliary tract: Secondary | ICD-10-CM | POA: Diagnosis not present

## 2017-11-28 LAB — HEPATIC FUNCTION PANEL
ALT: 133 IU/L — AB (ref 0–32)
AST: 48 IU/L — ABNORMAL HIGH (ref 0–40)
Albumin: 4.5 g/dL (ref 3.5–4.7)
Alkaline Phosphatase: 210 IU/L — ABNORMAL HIGH (ref 39–117)
BILIRUBIN TOTAL: 1.1 mg/dL (ref 0.0–1.2)
BILIRUBIN, DIRECT: 0.61 mg/dL — AB (ref 0.00–0.40)
TOTAL PROTEIN: 6.8 g/dL (ref 6.0–8.5)

## 2017-11-28 NOTE — Assessment & Plan Note (Signed)
From apparent choledocholithiasis Sudden relief of pain suggests stone that passed ERCP done  Significant improvement in LFTs from yesterday Reviewed Dr Dorothey Baseman note from follow up No further action needed about this Will recheck LFTs at next routine visit

## 2017-11-28 NOTE — Progress Notes (Signed)
Subjective:    Patient ID: Dominique Hall, female    DOB: 1935-07-03, 83 y.o.   MRN: [id number]  HPI Here with husband for hospital follow up Feels that she was having "this come on" Urine had been dark, couple of abdominal pain spells which were brief Then had really bad pain spell--middle of the day. Not postprandial Elevated LFTs Some degree of clinical jaundice CT showed dilatation of CBD Ongoing severe pain--- not satisfied with the delay in ER Suddenly pain resolved--presumably passed stone  Dr Allen Norris did ERCP the next day Dilatation done and "sludge" did come from duct Has done well since then  Appetite is still "iffy" Lost 8# through this whole process  Current Outpatient Medications on File Prior to Visit  Medication Sig Dispense Refill  . clorazepate (TRANXENE) 7.5 MG tablet Take 1 to 2 tablets at bedtime as needed. 60 tablet 0  . estradiol (ESTRACE) 0.1 MG/GM vaginal cream Place 1 Applicatorful vaginally 3 (three) times a week. 42.5 g 1  . hydrochlorothiazide (MICROZIDE) 12.5 MG capsule TAKE 1 CAPSULE EVERY       MORNING 90 capsule 3  . meloxicam (MOBIC) 7.5 MG tablet TAKE 1 TABLET BY MOUTH EVERY DAY 30 tablet 5  . Multiple Vitamin (MULTIVITAMIN) tablet Take 1 tablet by mouth daily.    Marland Kitchen NEXIUM 40 MG capsule Take 1 capsule (40 mg total) by mouth daily. 90 capsule 3  . Omega-3 Fatty Acids (FISH OIL PO) Take 1 capsule by mouth daily.    . vitamin C (ASCORBIC ACID) 500 MG tablet Take 500 mg by mouth daily.    Marland Kitchen ZANTAC 150 MG tablet Take 1 tablet (150 mg total) by mouth at bedtime. 90 tablet 3  . ZOFRAN 4 MG tablet Take 1 tablet (4 mg total) by mouth every 8 (eight) hours as needed for nausea or vomiting. 20 tablet 0   No current facility-administered medications on file prior to visit.     Allergies  Allergen Reactions  . Penicillins     REACTION: unspecified  . Rabeprazole Sodium     REACTION: constipated  . Sulfonamide Derivatives     REACTION: unspecified    . Cefuroxime     diarrhea    Past Medical History:  Diagnosis Date  . Allergy   . Arthritis   . Diverticulosis   . Esophageal dysmotility   . Esophageal ring   . GERD (gastroesophageal reflux disease)   . Hiatal hernia   . HTN (hypertension)   . IBS (irritable bowel syndrome)   . Osteopenia   . Renal disorder   . Sleep disturbance     Past Surgical History:  Procedure Laterality Date  . ABDOMINAL HYSTERECTOMY    . APPENDECTOMY  1973  . BREAST EXCISIONAL BIOPSY Right   . CHOLECYSTECTOMY    . ERCP N/A 11/22/2017   Procedure: ENDOSCOPIC RETROGRADE CHOLANGIOPANCREATOGRAPHY (ERCP);  Surgeon: Lucilla Lame, MD;  Location: [facility] ENDOSCOPY;  Service: Endoscopy;  Laterality: N/A;  . ESOPHAGOGASTRODUODENOSCOPY  07-2006   with dilation  . [place]  . lumpectomy  [phone]   benign right breast  . SQUAMOUS CELL CARCINOMA EXCISION  6/12   Right leg--Dr Pat Patrick  . SQUAMOUS CELL CARCINOMA EXCISION  2012   right calf  . TOTAL ABDOMINAL HYSTERECTOMY W/ BILATERAL SALPINGOOPHORECTOMY  1973    Family History  Problem Relation Age of Onset  . Heart attack Father   . Stroke Mother   . Melanoma Daughter  Social History   Socioeconomic History  . Marital status: Married    Spouse name: Not on file  . Number of children: 2  . Years of education: Not on file  . Highest education level: Not on file  Social Needs  . Financial resource strain: Not on file  . Food insecurity - worry: Not on file  . Food insecurity - inability: Not on file  . Transportation needs - medical: Not on file  . Transportation needs - non-medical: Not on file  Occupational History  . Occupation: Lawyer  Tobacco Use  . Smoking status: Former Research scientist (life sciences)  . Smokeless tobacco: Never Used  Substance and Sexual Activity  . Alcohol use: Yes    Comment: occ  . Drug use: No  . Sexual activity: Not on file  Other Topics Concern  . Not on file  Social History Narrative   Has living will    Husband is [place] health care POA   Would accept resuscitation but no prolonged ventilation   Probably would not want tube feedings if cognitively unaware   Review of Systems Urine color somewhat more normal Now has hearing aides    Objective:   Physical Exam  Constitutional: She appears well-developed. No distress.  Neck: No thyromegaly present.  Cardiovascular: Normal rate, regular rhythm and normal heart sounds. Exam reveals no gallop.  No murmur heard. Pulmonary/Chest: Effort normal and breath sounds normal. No respiratory distress. She has no wheezes. She has no rales.  Abdominal: Soft. Bowel sounds are normal. She exhibits no distension. There is no tenderness. There is no rebound and no guarding.  Musculoskeletal: She exhibits no edema.  Lymphadenopathy:    She has no cervical adenopathy.  Psychiatric: She has a normal mood and affect. Her behavior is normal.          Assessment & Plan:

## 2017-12-02 ENCOUNTER — Telehealth: Payer: Self-pay

## 2017-12-02 ENCOUNTER — Other Ambulatory Visit: Payer: Self-pay

## 2017-12-02 DIAGNOSIS — R748 Abnormal levels of other serum enzymes: Secondary | ICD-10-CM

## 2017-12-02 NOTE — Telephone Encounter (Signed)
-----   Message from Lucilla Lame, MD sent at 11/28/2017  7:44 AM EST ----- Let the patient know that the liver enzymes are better but not completely normal. Repeat in 1 week.

## 2017-12-02 NOTE — Telephone Encounter (Signed)
Pt notified of lab results. Pt will go in 1 week for repeat hepatic function.

## 2017-12-09 DIAGNOSIS — R748 Abnormal levels of other serum enzymes: Secondary | ICD-10-CM | POA: Diagnosis not present

## 2017-12-10 ENCOUNTER — Telehealth: Payer: Self-pay

## 2017-12-10 ENCOUNTER — Encounter: Payer: Self-pay | Admitting: Gastroenterology

## 2017-12-10 LAB — HEPATIC FUNCTION PANEL
ALBUMIN: 4.4 g/dL (ref 3.5–4.7)
ALT: 18 IU/L (ref 0–32)
AST: 20 IU/L (ref 0–40)
Alkaline Phosphatase: 97 IU/L (ref 39–117)
Bilirubin Total: 0.7 mg/dL (ref 0.0–1.2)
Bilirubin, Direct: 0.32 mg/dL (ref 0.00–0.40)
Total Protein: 6.4 g/dL (ref 6.0–8.5)

## 2017-12-10 NOTE — Telephone Encounter (Signed)
-----   Message from Lucilla Lame, MD sent at 12/10/2017 12:20 PM EST ----- Let the patient know her liver enzymes have come back to normal.

## 2017-12-10 NOTE — Telephone Encounter (Signed)
Pt notified of liver enzymes results.

## 2017-12-11 ENCOUNTER — Other Ambulatory Visit: Payer: Self-pay | Admitting: Internal Medicine

## 2017-12-11 DIAGNOSIS — Z139 Encounter for screening, unspecified: Secondary | ICD-10-CM

## 2018-01-15 ENCOUNTER — Other Ambulatory Visit: Payer: Self-pay | Admitting: Internal Medicine

## 2018-01-15 DIAGNOSIS — G4700 Insomnia, unspecified: Secondary | ICD-10-CM

## 2018-01-16 MED ORDER — CLORAZEPATE DIPOTASSIUM 7.5 MG PO TABS: ORAL_TABLET | ORAL | 0 refills | Status: DC

## 2018-01-16 NOTE — Telephone Encounter (Signed)
Await return of PCP  For refill ...given controlled substance, not using every day.

## 2018-01-16 NOTE — Telephone Encounter (Signed)
Last filled 10-25-17 #60 Last OV 11-30-17 Next OV 03-31-18  Forward to Dr Diona Browner in Dr Alla German absence

## 2018-01-27 ENCOUNTER — Ambulatory Visit
Admission: RE | Admit: 2018-01-27 | Discharge: 2018-01-27 | Disposition: A | Discharge status: Home / Self Care | Payer: Medicare Other | Source: Ambulatory Visit | Attending: Internal Medicine | Admitting: Internal Medicine

## 2018-01-27 DIAGNOSIS — Z139 Encounter for screening, unspecified: Secondary | ICD-10-CM

## 2018-01-27 DIAGNOSIS — Z1231 Encounter for screening mammogram for malignant neoplasm of breast: Secondary | ICD-10-CM | POA: Diagnosis not present

## 2018-03-23 ENCOUNTER — Other Ambulatory Visit: Payer: Self-pay | Admitting: Internal Medicine

## 2018-03-31 ENCOUNTER — Encounter: Payer: Self-pay | Admitting: Internal Medicine

## 2018-03-31 ENCOUNTER — Ambulatory Visit (INDEPENDENT_AMBULATORY_CARE_PROVIDER_SITE_OTHER): Payer: Medicare Other | Admitting: Internal Medicine

## 2018-03-31 VITALS — BP 138/76 | HR 78 | Temp 97.6°F | Ht 64.0 in | Wt 155.0 lb

## 2018-03-31 DIAGNOSIS — Z0000 Encounter for general adult medical examination without abnormal findings: Secondary | ICD-10-CM | POA: Diagnosis not present

## 2018-03-31 DIAGNOSIS — I10 Essential (primary) hypertension: Secondary | ICD-10-CM | POA: Diagnosis not present

## 2018-03-31 DIAGNOSIS — G4700 Insomnia, unspecified: Secondary | ICD-10-CM

## 2018-03-31 DIAGNOSIS — Z7189 Other specified counseling: Secondary | ICD-10-CM | POA: Diagnosis not present

## 2018-03-31 DIAGNOSIS — F39 Unspecified mood [affective] disorder: Secondary | ICD-10-CM | POA: Diagnosis not present

## 2018-03-31 DIAGNOSIS — M25562 Pain in left knee: Secondary | ICD-10-CM | POA: Diagnosis not present

## 2018-03-31 DIAGNOSIS — G8929 Other chronic pain: Secondary | ICD-10-CM | POA: Diagnosis not present

## 2018-03-31 DIAGNOSIS — K219 Gastro-esophageal reflux disease without esophagitis: Secondary | ICD-10-CM

## 2018-03-31 MED ORDER — CLORAZEPATE DIPOTASSIUM 7.5 MG PO TABS: ORAL_TABLET | ORAL | 0 refills | Status: DC

## 2018-03-31 NOTE — Assessment & Plan Note (Signed)
BP Readings from Last 3 Encounters:  03/31/18 138/76  11/28/17 118/70  11/27/17 113/61   Good control

## 2018-03-31 NOTE — Assessment & Plan Note (Signed)
I have personally reviewed the Medicare Annual Wellness questionnaire and have noted 1. The patient's medical and social history 2. Their use of alcohol, tobacco or illicit drugs 3. Their current medications and supplements 4. The patient's functional ability including ADL's, fall risks, home safety risks and hearing or visual             impairment. 5. Diet and physical activities 6. Evidence for depression or mood disorders  The patients weight, height, BMI and visual acuity have been recorded in the chart I have made referrals, counseling and provided education to the patient based review of the above and I have provided the pt with a written personalized care plan for preventive services.  I have provided you with a copy of your personalized plan for preventive services. Please take the time to review along with your updated medication list.  No cancer screening due to age--did just have mammo though Yearly flu vaccine Is going to get shingrix Prefers no Td booster--will get if any injury Discussed resistance training

## 2018-03-31 NOTE — Progress Notes (Signed)
Subjective:    Patient ID: Dominique Hall, female    DOB: November 07, 1935, 83 y.o.   MRN: [id number]  HPI Here for Medicare wellness and follow up of chronic health conditions Reviewed form and advanced directives Reviewed other doctors Walks some (but has slacked off) and does housework--stays active Still enjoys 1-2 glasses of wine--most nights No tobacco Vision is okay Hearing aides --they are helping No falls No depression or anhedonia Independent with instrumental ADLs No apparent memory problems  Feels good since the ERCP No symptoms Eating well Gained back the weight she lost when she was sick  Still gets occasional panic type sensation Has the tranxene for this (and occasionally helps with occ dysphagia if overeating)  Still takes the nexium every day Occasionally takes zantac also Didn't tolerate the lower dose  Takes the meloxicam daily For left knee Helps keep her mobile  No chest pain No edema No dizziness or syncope No SOB or change in exercise tolerance No palpitations--unless she overdoes it  Current Outpatient Medications on File Prior to Visit  Medication Sig Dispense Refill  . clorazepate (TRANXENE) 7.5 MG tablet Take 1 to 2 tablets at bedtime as needed. 60 tablet 0  . hydrochlorothiazide (MICROZIDE) 12.5 MG capsule TAKE 1 CAPSULE EVERY       MORNING 90 capsule 3  . meloxicam (MOBIC) 7.5 MG tablet TAKE 1 TABLET BY MOUTH EVERY DAY 30 tablet 5  . Multiple Vitamin (MULTIVITAMIN) tablet Take 1 tablet by mouth daily.    Marland Kitchen NEXIUM 40 MG capsule Take 1 capsule (40 mg total) by mouth daily. 90 capsule 3  . Omega-3 Fatty Acids (FISH OIL PO) Take 1 capsule by mouth daily.    . vitamin C (ASCORBIC ACID) 500 MG tablet Take 500 mg by mouth daily.    Marland Kitchen ZANTAC 150 MG tablet Take 1 tablet (150 mg total) by mouth at bedtime. 90 tablet 3  . ZOFRAN 4 MG tablet Take 1 tablet (4 mg total) by mouth every 8 (eight) hours as needed for nausea or vomiting. 20 tablet 0   No  current facility-administered medications on file prior to visit.     Allergies  Allergen Reactions  . Penicillins     REACTION: unspecified  . Rabeprazole Sodium     REACTION: constipated  . Sulfonamide Derivatives     REACTION: unspecified  . Cefuroxime     diarrhea    Past Medical History:  Diagnosis Date  . Allergy   . Arthritis   . Diverticulosis   . Esophageal dysmotility   . Esophageal ring   . GERD (gastroesophageal reflux disease)   . Hiatal hernia   . HTN (hypertension)   . IBS (irritable bowel syndrome)   . Osteopenia   . Renal disorder   . Sleep disturbance     Past Surgical History:  Procedure Laterality Date  . ABDOMINAL HYSTERECTOMY    . APPENDECTOMY  1973  . BREAST EXCISIONAL BIOPSY Right   . CHOLECYSTECTOMY    . ERCP N/A 11/22/2017   Procedure: ENDOSCOPIC RETROGRADE CHOLANGIOPANCREATOGRAPHY (ERCP);  Surgeon: Lucilla Lame, MD;  Location: [facility] ENDOSCOPY;  Service: Endoscopy;  Laterality: N/A;  . ESOPHAGOGASTRODUODENOSCOPY  07-2006   with dilation  . [place]  . lumpectomy  [phone]   benign right breast  . SQUAMOUS CELL CARCINOMA EXCISION  6/12   Right leg--Dr Pat Patrick  . SQUAMOUS CELL CARCINOMA EXCISION  2012   right calf  . TOTAL ABDOMINAL HYSTERECTOMY W/ BILATERAL SALPINGOOPHORECTOMY  71    Family History  Problem Relation Age of Onset  . Heart attack Father   . Stroke Mother   . Melanoma Daughter     Social History   Socioeconomic History  . Marital status: Married    Spouse name: Not on file  . Number of children: 2  . Years of education: Not on file  . Highest education level: Not on file  Occupational History  . Occupation: Lawyer  Social Needs  . Financial resource strain: Not on file  . Food insecurity:    Worry: Not on file    Inability: Not on file  . Transportation needs:    Medical: Not on file    Non-medical: Not on file  Tobacco Use  . Smoking status: Former Research scientist (life sciences)  . Smokeless tobacco: Never  Used  Substance and Sexual Activity  . Alcohol use: Yes    Comment: occ  . Drug use: No  . Sexual activity: Not on file  Lifestyle  . Physical activity:    Days per week: Not on file    Minutes per session: Not on file  . Stress: Not on file  Relationships  . Social connections:    Talks on phone: Not on file    Gets together: Not on file    Attends religious service: Not on file    Active member of club or organization: Not on file    Attends meetings of clubs or organizations: Not on file    Relationship status: Not on file  . Intimate partner violence:    Fear of current or ex partner: Not on file    Emotionally abused: Not on file    Physically abused: Not on file    Forced sexual activity: Not on file  Other Topics Concern  . Not on file  Social History Narrative   Has living will   Husband is [place] health care POA---alternate is daughter Sula Soda   Would accept resuscitation but no prolonged ventilation   Probably would not want tube feedings if cognitively unaware   Review of Systems No headaches Appetite is fine Sleeps fine Wears seat belt Teeth okay--keeps up with dentist (recent extraction) Bowels are fine--no blood Voids okay No other joint pain No rash or suspicious lesions. No longer uses the estrogen cream    Objective:   Physical Exam  Constitutional: She is oriented to person, place, and time. She appears well-developed. No distress.  HENT:  Mouth/Throat: Oropharynx is clear and moist. No oropharyngeal exudate.  Neck: No thyromegaly present.  Cardiovascular: Normal rate, regular rhythm, normal heart sounds and intact distal pulses. Exam reveals no gallop.  No murmur heard. Pulmonary/Chest: Effort normal and breath sounds normal. No respiratory distress. She has no wheezes. She has no rales.  Abdominal: Soft. There is no tenderness.  Musculoskeletal: She exhibits no edema or tenderness.  Lymphadenopathy:    She has no cervical adenopathy.    Neurological: She is alert and oriented to person, place, and time.  President-- "Dwaine Deter, Bush" [phone] [id number] Recall 3/3  Skin: Skin is warm. No rash noted.  Psychiatric: She has a normal mood and affect. Her behavior is normal.          Assessment & Plan:

## 2018-03-31 NOTE — Assessment & Plan Note (Signed)
meloxicam controls it  Baker's cyst Will check labs

## 2018-03-31 NOTE — Assessment & Plan Note (Signed)
Panic/anxiety at times---- uses the tranxene prn

## 2018-03-31 NOTE — Progress Notes (Signed)
Hearing Screening Comments: Wears Hearing Aids Vision Screening Comments: May 2018. Has upcoming appt

## 2018-03-31 NOTE — Assessment & Plan Note (Signed)
occ dysphagia Takes the nexium daily

## 2018-03-31 NOTE — Assessment & Plan Note (Signed)
See social history 

## 2018-04-01 LAB — COMPREHENSIVE METABOLIC PANEL
ALK PHOS: 63 U/L (ref 39–117)
ALT: 12 U/L (ref 0–35)
AST: 14 U/L (ref 0–37)
Albumin: 4.1 g/dL (ref 3.5–5.2)
BILIRUBIN TOTAL: 0.5 mg/dL (ref 0.2–1.2)
BUN: 27 mg/dL — ABNORMAL HIGH (ref 6–23)
CALCIUM: 9.5 mg/dL (ref 8.4–10.5)
CO2: 29 meq/L (ref 19–32)
Chloride: 104 mEq/L (ref 96–112)
Creatinine, Ser: 0.73 mg/dL (ref 0.40–1.20)
GFR: 81.32 mL/min (ref >60.00)
Glucose, Bld: 100 mg/dL — ABNORMAL HIGH (ref 70–99)
Potassium: 3.9 mEq/L (ref 3.5–5.1)
Sodium: 141 mEq/L (ref 135–145)
TOTAL PROTEIN: 6.5 g/dL (ref 6.0–8.3)

## 2018-04-01 LAB — CBC
HCT: 35.4 % — ABNORMAL LOW (ref 36.0–46.0)
HEMOGLOBIN: 12.2 g/dL (ref 12.0–15.0)
MCHC: 34.6 g/dL (ref 30.0–36.0)
MCV: 85.2 fl (ref 78.0–100.0)
PLATELETS: 174 10*3/uL (ref 150.0–400.0)
RBC: 4.15 Mil/uL (ref 3.87–5.11)
RDW: 13.2 % (ref 11.5–15.5)
WBC: 5.7 10*3/uL (ref 4.0–10.5)

## 2018-04-08 DIAGNOSIS — H2513 Age-related nuclear cataract, bilateral: Secondary | ICD-10-CM | POA: Diagnosis not present

## 2018-05-22 ENCOUNTER — Ambulatory Visit (INDEPENDENT_AMBULATORY_CARE_PROVIDER_SITE_OTHER): Payer: Medicare Other | Admitting: Family Medicine

## 2018-05-22 ENCOUNTER — Ambulatory Visit: Payer: Medicare Other | Admitting: Internal Medicine

## 2018-05-22 ENCOUNTER — Encounter: Payer: Self-pay | Admitting: Family Medicine

## 2018-05-22 DIAGNOSIS — L309 Dermatitis, unspecified: Secondary | ICD-10-CM | POA: Insufficient documentation

## 2018-05-22 MED ORDER — TRIAMCINOLONE ACETONIDE 0.1 % EX CREA: 1.0000 "application " | TOPICAL_CREAM | Freq: Two times a day (BID) | CUTANEOUS | 0 refills | Status: DC

## 2018-05-22 NOTE — Patient Instructions (Signed)
I think you may have a photosensitivity reaction (from hctz) -since rash is on sun exposed areas   Use sunscreen at all times when outdoors   Stop scented products like bath and body works   Try the triamcinolone cream for itchy areas   See your dermatologist when it is time   If not improving or worse let us know   For dry skin I like eucerin cream , lubriderm, aaveno, and also aquaphor Also cerave   Pick non scented products

## 2018-05-22 NOTE — Assessment & Plan Note (Signed)
Rash resembles contact dermatitis - only in sun exp areas (but sparing face)  ? If possible HCTZ photosensitivity reaction  Disc use of sun protection /clothing sunscreen Eliminate products with fragrance  Avoid hot water and moisturize  Triamcinolone for itchy areas Update if not starting to improve in a week or if worsening    Meds ordered this encounter  Medications  . triamcinolone cream (KENALOG) 0.1 %    Sig: Apply 1 application topically 2 (two) times daily. To itchy/rash areas    Dispense:  30 g    Refill:  0

## 2018-05-22 NOTE — Progress Notes (Signed)
Subjective:    Patient ID: Dominique Hall, female    DOB: 30-Aug-1935, 83 y.o.   MRN: [id number]  HPI 83 yo pt of Dr Silvio Pate her with a rash on arms and legs for 2-3 weeks   Rash/ bumpy on arms and legs  Mildly itchy -noticeable but not scratching   Started near R knee  Progressed from there   None on trunk or face or scalp or hands or feet   No tick bites known  Is prone to mosquito bites   She ate a lot of peaches right before this started  No hx of allergy however   Uses dove soap for cleansing  Uses bath and body works products also   Not a lot of sun exp lately - some in back yard  Takes hctz   She used an anti itch cream - benadryl  No oral antihistamines   Patient Active Problem List   Diagnosis Date Noted  . Dermatitis 05/22/2018  . Left knee pain 12/14/2016  . Solitary pulmonary nodule 12/22/2015  . Advance directive discussed with patient 03/17/2015  . Atrophic vaginitis 01/29/2014  . Cystocele, grade 1-2 01/29/2014  . Fibrocystic breast changes 12/05/2012  . Routine general medical examination at a health care facility 08/14/2011  . Mild mood disorder ([place]) 04/15/2007  . Essential hypertension, benign 04/15/2007  . ALLERGIC RHINITIS 04/15/2007  . GERD 04/15/2007  . DIVERTICULOSIS, COLON 04/15/2007  . OSTEOPENIA 04/15/2007   Past Medical History:  Diagnosis Date  . Allergy   . Arthritis   . Diverticulosis   . Esophageal dysmotility   . Esophageal ring   . GERD (gastroesophageal reflux disease)   . Hiatal hernia   . HTN (hypertension)   . IBS (irritable bowel syndrome)   . Osteopenia   . Renal disorder   . Sleep disturbance    Past Surgical History:  Procedure Laterality Date  . ABDOMINAL HYSTERECTOMY    . APPENDECTOMY  1973  . BREAST EXCISIONAL BIOPSY Right   . CHOLECYSTECTOMY    . ERCP N/A 11/22/2017   Procedure: ENDOSCOPIC RETROGRADE CHOLANGIOPANCREATOGRAPHY (ERCP);  Surgeon: Lucilla Lame, MD;  Location: [facility] ENDOSCOPY;  Service:  Endoscopy;  Laterality: N/A;  . ESOPHAGOGASTRODUODENOSCOPY  07-2006   with dilation  . [place]  . lumpectomy  [phone]   benign right breast  . SQUAMOUS CELL CARCINOMA EXCISION  6/12   Right leg--Dr Pat Patrick  . SQUAMOUS CELL CARCINOMA EXCISION  2012   right calf  . TOTAL ABDOMINAL HYSTERECTOMY W/ BILATERAL SALPINGOOPHORECTOMY  1973   Social History   Tobacco Use  . Smoking status: Former Research scientist (life sciences)  . Smokeless tobacco: Never Used  Substance Use Topics  . Alcohol use: Yes    Comment: occ  . Drug use: No   Family History  Problem Relation Age of Onset  . Heart attack Father   . Stroke Mother   . Melanoma Daughter    Allergies  Allergen Reactions  . Penicillins     REACTION: unspecified  . Rabeprazole Sodium     REACTION: constipated  . Sulfonamide Derivatives     REACTION: unspecified  . Cefuroxime     diarrhea   Current Outpatient Medications on File Prior to Visit  Medication Sig Dispense Refill  . clorazepate (TRANXENE) 7.5 MG tablet Take 1 to 2 tablets at bedtime as needed. 60 tablet 0  . hydrochlorothiazide (MICROZIDE) 12.5 MG capsule TAKE 1 CAPSULE EVERY       MORNING 90 capsule  3  . meloxicam (MOBIC) 7.5 MG tablet TAKE 1 TABLET BY MOUTH EVERY DAY 30 tablet 5  . Multiple Vitamin (MULTIVITAMIN) tablet Take 1 tablet by mouth daily.    Marland Kitchen NEXIUM 40 MG capsule Take 1 capsule (40 mg total) by mouth daily. 90 capsule 3  . Omega-3 Fatty Acids (FISH OIL PO) Take 1 capsule by mouth daily.    . vitamin C (ASCORBIC ACID) 500 MG tablet Take 500 mg by mouth daily.    Marland Kitchen ZANTAC 150 MG tablet Take 1 tablet (150 mg total) by mouth at bedtime. 90 tablet 3  . ZOFRAN 4 MG tablet Take 1 tablet (4 mg total) by mouth every 8 (eight) hours as needed for nausea or vomiting. 20 tablet 0   No current facility-administered medications on file prior to visit.      Review of Systems  Constitutional: Negative for activity change, appetite change, fatigue, fever and unexpected weight  change.  HENT: Negative for congestion, ear pain, rhinorrhea, sinus pressure and sore throat.   Eyes: Negative for pain, redness and visual disturbance.  Respiratory: Negative for cough, shortness of breath and wheezing.   Cardiovascular: Negative for chest pain and palpitations.  Gastrointestinal: Negative for abdominal pain, blood in stool, constipation and diarrhea.  Endocrine: Negative for polydipsia and polyuria.  Genitourinary: Negative for dysuria, frequency and urgency.  Musculoskeletal: Negative for arthralgias, back pain and myalgias.  Skin: Positive for rash. Negative for pallor.       Mild itching   Allergic/Immunologic: Negative for environmental allergies.  Neurological: Negative for dizziness, syncope and headaches.  Hematological: Negative for adenopathy. Does not bruise/bleed easily.  Psychiatric/Behavioral: Negative for decreased concentration and dysphoric mood. The patient is not nervous/anxious.        Objective:   Physical Exam  Constitutional: She appears well-developed and well-nourished. No distress.  HENT:  Head: Normocephalic and atraumatic.  Nose: Nose normal.  Mouth/Throat: Oropharynx is clear and moist.  Eyes: Pupils are equal, round, and reactive to light. Conjunctivae and EOM are normal. Right eye exhibits no discharge. Left eye exhibits no discharge. No scleral icterus.  Neck: Normal range of motion. Neck supple.  Cardiovascular: Normal rate, regular rhythm and normal heart sounds.  Pulmonary/Chest: Effort normal and breath sounds normal. She has no wheezes.  Lymphadenopathy:    She has no cervical adenopathy.  Skin: Skin is warm and dry. Capillary refill takes less than 2 seconds. Rash noted. No pallor.  Maculopapular rash on arms and legs (stopping at lines where clothing starts)  Erythematous  No excoriations  Lentigines/sks diffusely   Psychiatric: She has a normal mood and affect.          Assessment & Plan:   Problem List Items  Addressed This Visit      Musculoskeletal and Integument   Dermatitis    Rash resembles contact dermatitis - only in sun exp areas (but sparing face)  ? If possible HCTZ photosensitivity reaction  Disc use of sun protection /clothing sunscreen Eliminate products with fragrance  Avoid hot water and moisturize  Triamcinolone for itchy areas Update if not starting to improve in a week or if worsening    Meds ordered this encounter  Medications  . triamcinolone cream (KENALOG) 0.1 %    Sig: Apply 1 application topically 2 (two) times daily. To itchy/rash areas    Dispense:  30 g    Refill:  0

## 2018-06-23 ENCOUNTER — Emergency Department: Payer: Medicare Other

## 2018-06-23 ENCOUNTER — Ambulatory Visit: Payer: Self-pay | Admitting: Internal Medicine

## 2018-06-23 ENCOUNTER — Encounter: Payer: Self-pay | Admitting: *Deleted

## 2018-06-23 ENCOUNTER — Other Ambulatory Visit: Payer: Self-pay

## 2018-06-23 ENCOUNTER — Emergency Department
Admission: EM | Admit: 2018-06-23 | Discharge: 2018-06-23 | Disposition: A | Discharge status: Home / Self Care | Payer: Medicare Other | Attending: Emergency Medicine | Admitting: Emergency Medicine

## 2018-06-23 ENCOUNTER — Telehealth: Payer: Self-pay | Admitting: Internal Medicine

## 2018-06-23 DIAGNOSIS — H539 Unspecified visual disturbance: Secondary | ICD-10-CM | POA: Insufficient documentation

## 2018-06-23 DIAGNOSIS — R519 Headache, unspecified: Secondary | ICD-10-CM

## 2018-06-23 DIAGNOSIS — Z87891 Personal history of nicotine dependence: Secondary | ICD-10-CM | POA: Insufficient documentation

## 2018-06-23 DIAGNOSIS — R51 Headache: Secondary | ICD-10-CM | POA: Diagnosis not present

## 2018-06-23 DIAGNOSIS — Z79899 Other long term (current) drug therapy: Secondary | ICD-10-CM | POA: Insufficient documentation

## 2018-06-23 DIAGNOSIS — I10 Essential (primary) hypertension: Secondary | ICD-10-CM | POA: Insufficient documentation

## 2018-06-23 DIAGNOSIS — Z85828 Personal history of other malignant neoplasm of skin: Secondary | ICD-10-CM | POA: Diagnosis not present

## 2018-06-23 LAB — COMPREHENSIVE METABOLIC PANEL
ALT: 15 U/L (ref 0–44)
ANION GAP: 5 (ref 5–15)
AST: 21 U/L (ref 15–41)
Albumin: 3.9 g/dL (ref 3.5–5.0)
Alkaline Phosphatase: 59 U/L (ref 38–126)
BUN: 27 mg/dL — ABNORMAL HIGH (ref 8–23)
CHLORIDE: 107 mmol/L (ref 98–111)
CO2: 28 mmol/L (ref 22–32)
CREATININE: 0.85 mg/dL (ref 0.44–1.00)
Calcium: 9.1 mg/dL (ref 8.9–10.3)
Glucose, Bld: 122 mg/dL — ABNORMAL HIGH (ref 70–99)
Potassium: 3.6 mmol/L (ref 3.5–5.1)
Sodium: 140 mmol/L (ref 135–145)
Total Bilirubin: 0.9 mg/dL (ref 0.3–1.2)
Total Protein: 6.6 g/dL (ref 6.5–8.1)

## 2018-06-23 LAB — CBC
HEMATOCRIT: 35.3 % (ref 35.0–47.0)
Hemoglobin: 12.5 g/dL (ref 12.0–16.0)
MCH: 30.4 pg (ref 26.0–34.0)
MCHC: 35.4 g/dL (ref 32.0–36.0)
MCV: 85.9 fL (ref 80.0–100.0)
Platelets: 155 10*3/uL (ref 150–440)
RBC: 4.11 MIL/uL (ref 3.80–5.20)
RDW: 13.2 % (ref 11.5–14.5)
WBC: 5.5 10*3/uL (ref 3.6–11.0)

## 2018-06-23 LAB — DIFFERENTIAL
BASOS PCT: 0 %
Basophils Absolute: 0 10*3/uL (ref 0–0.1)
EOS PCT: 1 %
Eosinophils Absolute: 0.1 10*3/uL (ref 0–0.7)
Lymphocytes Relative: 16 %
Lymphs Abs: 0.9 10*3/uL — ABNORMAL LOW (ref 1.0–3.6)
MONOS PCT: 7 %
Monocytes Absolute: 0.4 10*3/uL (ref 0.2–0.9)
Neutro Abs: 4.2 10*3/uL (ref 1.4–6.5)
Neutrophils Relative %: 76 %

## 2018-06-23 LAB — SEDIMENTATION RATE: SED RATE: 15 mm/h (ref 0–30)

## 2018-06-23 NOTE — Discharge Instructions (Addendum)
Please seek medical attention for any high fevers, chest pain, shortness of breath, change in behavior, persistent vomiting, bloody stool or any other new or concerning symptoms.  

## 2018-06-23 NOTE — ED Notes (Signed)
First Nurse Note: Patient states that 4X in the past month she has experienced episodes of loss of visual field on right with HA.  States her MD office called her to come in to be seen.  Patient denies any sx at this time.

## 2018-06-23 NOTE — Telephone Encounter (Addendum)
I spoke with pt after Raquel Sarna at front desk said [facility] called after noticing the email pt sent. I advised pt per Dr Alla German note that he would await ED eval; could be migraine type phenomenon but stroke needs to be r/o. Pt voiced understanding and will go to [facility] ED. FYI to Dr Silvio Pate.

## 2018-06-23 NOTE — Telephone Encounter (Signed)
I will await the ER evaluation Sounds like migraine type phenomenon---but stroke must be considered, especially at her age

## 2018-06-23 NOTE — Telephone Encounter (Signed)
I triaged the pt this morning and referred her to the ED.   Dr. Silvio Pate acknowledged in his note that he would wait on the ER evaluation.  I noticed a few minutes later that she had submitted a message to Dr. Silvio Pate via Mychart about how she has had bad experiences in the ED.  She is waiting for a response from Dr. Silvio Pate.   She's not refusing to go to the ED just wondering if Dr. Silvio Pate could make the process of going easier.  I called the flow coordinator and made her aware of the Mychart message.    She will bring it to Dr. Alla German attention since [place] is for non-urgent medical questions.

## 2018-06-23 NOTE — Telephone Encounter (Signed)
Not sure I wouldn't have just seen her in the office---but can't argue with the ED disposition

## 2018-06-23 NOTE — ED Provider Notes (Signed)
[facility] Emergency Department Provider Note    ____________________________________________   I have reviewed the triage vital signs and the nursing notes.   HISTORY  Chief Complaint Loss of Vision   History limited by: Not Limited   HPI Dominique Hall is a 83 y.o. female who presents to the emergency department today because of intermittent episodes of headache and vision change.  Patient states that symptoms started roughly 1 month ago.  She is now had 4 discrete episodes.  She states she will have pain in her right temple and then noticed the right side of the vision of her right eye will go blurry.  This will last at most a couple of minutes.  She has not noticed any pattern to it.  Most recently happened a couple days ago when she stood up from a chair.  The patient denies any headache or vision change at this time.  Patient denies any trauma to her head.   Per medical record review patient has a history of diverticulosis.   Past Medical History:  Diagnosis Date  . Allergy   . Arthritis   . Diverticulosis   . Esophageal dysmotility   . Esophageal ring   . GERD (gastroesophageal reflux disease)   . Hiatal hernia   . HTN (hypertension)   . IBS (irritable bowel syndrome)   . Osteopenia   . Renal disorder   . Sleep disturbance     Patient Active Problem List   Diagnosis Date Noted  . Dermatitis 05/22/2018  . Left knee pain 12/14/2016  . Solitary pulmonary nodule 12/22/2015  . Advance directive discussed with patient 03/17/2015  . Atrophic vaginitis 01/29/2014  . Cystocele, grade 1-2 01/29/2014  . Fibrocystic breast changes 12/05/2012  . Routine general medical examination at a health care facility 08/14/2011  . Mild mood disorder ([place]) 04/15/2007  . Essential hypertension, benign 04/15/2007  . ALLERGIC RHINITIS 04/15/2007  . GERD 04/15/2007  . DIVERTICULOSIS, COLON 04/15/2007  . OSTEOPENIA 04/15/2007    Past Surgical History:   Procedure Laterality Date  . ABDOMINAL HYSTERECTOMY    . APPENDECTOMY  1973  . BREAST EXCISIONAL BIOPSY Right   . CHOLECYSTECTOMY    . ERCP N/A 11/22/2017   Procedure: ENDOSCOPIC RETROGRADE CHOLANGIOPANCREATOGRAPHY (ERCP);  Surgeon: Lucilla Lame, MD;  Location: [facility] ENDOSCOPY;  Service: Endoscopy;  Laterality: N/A;  . ESOPHAGOGASTRODUODENOSCOPY  07-2006   with dilation  . [place]  . lumpectomy  [phone]   benign right breast  . SQUAMOUS CELL CARCINOMA EXCISION  6/12   Right leg--Dr Pat Patrick  . SQUAMOUS CELL CARCINOMA EXCISION  2012   right calf  . TOTAL ABDOMINAL HYSTERECTOMY W/ BILATERAL SALPINGOOPHORECTOMY  1973    Prior to Admission medications   Medication Sig Start Date End Date Taking? Authorizing Provider  clorazepate (TRANXENE) 7.5 MG tablet Take 1 to 2 tablets at bedtime as needed. 03/31/18   Venia Carbon, MD  hydrochlorothiazide (MICROZIDE) 12.5 MG capsule TAKE 1 CAPSULE EVERY       MORNING 05/06/17   [name] I, MD  meloxicam (MOBIC) 7.5 MG tablet TAKE 1 TABLET BY MOUTH EVERY DAY 03/24/18   Venia Carbon, MD  Multiple Vitamin (MULTIVITAMIN) tablet Take 1 tablet by mouth daily.    [provider]  NEXIUM 40 MG capsule Take 1 capsule (40 mg total) by mouth daily. 07/01/17   Venia Carbon, MD  Omega-3 Fatty Acids (FISH OIL PO) Take 1 capsule by mouth daily.  [provider]  triamcinolone cream (KENALOG) 0.1 % Apply 1 application topically 2 (two) times daily. To itchy/rash areas 05/22/18   Tower, Wynelle Fanny, MD  vitamin C (ASCORBIC ACID) 500 MG tablet Take 500 mg by mouth daily.    [provider]  ZANTAC 150 MG tablet Take 1 tablet (150 mg total) by mouth at bedtime. 01/23/16   Pleas Koch, NP  ZOFRAN 4 MG tablet Take 1 tablet (4 mg total) by mouth every 8 (eight) hours as needed for nausea or vomiting. 09/06/17   Elby Beck, FNP    Allergies Penicillins; Rabeprazole sodium; Sulfonamide derivatives; and  Cefuroxime  Family History  Problem Relation Age of Onset  . Heart attack Father   . Stroke Mother   . Melanoma Daughter     Social History Social History   Tobacco Use  . Smoking status: Former Research scientist (life sciences)  . Smokeless tobacco: Never Used  Substance Use Topics  . Alcohol use: Yes    Comment: occ  . Drug use: No    Review of Systems Constitutional: No fever/chills Eyes: Positive for vision change. ENT: No sore throat. Cardiovascular: Denies chest pain. Respiratory: Denies shortness of breath. Gastrointestinal: No abdominal pain.  No nausea, no vomiting.  No diarrhea.   Genitourinary: Negative for dysuria. Musculoskeletal: Negative for back pain. Skin: Negative for rash. Neurological: Positive for headache ____________________________________________   PHYSICAL EXAM:  VITAL SIGNS: ED Triage Vitals  Enc Vitals Group     BP 06/23/18 1131 129/65     Pulse Rate 06/23/18 1131 78     Resp 06/23/18 1131 16     Temp 06/23/18 1131 97.9 F (36.6 C)     Temp Source 06/23/18 1131 Oral     SpO2 06/23/18 1131 98 %     Weight 06/23/18 1132 152 lb (68.9 kg)     Height 06/23/18 1132 '5\' 5"'  (1.651 m)     Head Circumference --      Peak Flow --      Pain Score 06/23/18 1132 0   Constitutional: Alert and oriented.  Eyes: Conjunctivae are normal.  ENT      Head: Normocephalic and atraumatic.      Nose: No congestion/rhinnorhea.      Mouth/Throat: Mucous membranes are moist.      Neck: No stridor. Hematological/Lymphatic/Immunilogical: No cervical lymphadenopathy. Cardiovascular: Normal rate, regular rhythm.  No murmurs, rubs, or gallops.  Respiratory: Normal respiratory effort without tachypnea nor retractions. Breath sounds are clear and equal bilaterally. No wheezes/rales/rhonchi. Gastrointestinal: Soft and non tender. No rebound. No guarding.  Genitourinary: Deferred Musculoskeletal: Normal range of motion in all extremities. No lower extremity edema. Neurologic:  Normal  speech and language. No gross focal neurologic deficits are appreciated.  Skin:  Skin is warm, dry and intact. No rash noted. Psychiatric: Mood and affect are normal. Speech and behavior are normal. Patient exhibits appropriate insight and judgment.  ____________________________________________    LABS (pertinent positives/negatives)  CBC wbc CMP wnl except glu 122, bun 27 ESR 15  ____________________________________________   EKG  None  ____________________________________________    RADIOLOGY  CT head No acute intracranial abnormality  ____________________________________________   PROCEDURES  Procedures  ____________________________________________   INITIAL IMPRESSION / ASSESSMENT AND PLAN / ED COURSE  Pertinent labs & imaging results that were available during my care of the patient were reviewed by me and considered in my medical decision making (see chart for details).   Patient presented to the emergency department today because  of intermittent episodes of right temple pain and vision change.  Patient without any symptoms at the time of the exam.  CT head and blood work without concerning findings.  At this point doubt temporal arteritis.  Consider glaucoma, TIAs, iritis, optic neuritis, retinal artery occlusion as other etiologies.  Given the patient is asymptomatic however feel further work-up as an outpatient is appropriate.  Discussed with patient importance of following up with her primary care and ophthalmologist.  ____________________________________________   FINAL CLINICAL IMPRESSION(S) / ED DIAGNOSES  Final diagnoses:  Change in vision  Nonintractable headache, unspecified chronicity pattern, unspecified headache type     Note: This dictation was prepared with Dragon dictation. Any transcriptional errors that result from this process are unintentional     Nance Pear, MD 06/23/18 1555

## 2018-06-23 NOTE — Telephone Encounter (Signed)
See triage notes.    Called in c/o having a headache which resolves followed by blurred vision in right eye that resolves after 5-10 minutes.    This has happened 3-4 times over the last month.   Last episode was Friday night.  She has been referred to the ED.  Her husband is going to take her now.  She is going to [facility].    I have routed a note to Dr. Silvio Pate letting him know.    Reason for Disposition . [1] Blurred vision or visual changes AND [2] present now AND [3] sudden onset or new (e.g., minutes, hours, days)  (Exception: seeing floaters / black specks OR previously diagnosed migraine headaches with same symptoms)  Answer Assessment - Initial Assessment Questions 1. SYMPTOM: "What is the main symptom you are concerned about?" (e.g., weakness, numbness)     It's been over this past month I've had 3-4 episodes of this.   I have a headache, then headache leaves part of my vision in my right eye is blurred.  It lasts 10 minutes then it's normal.   Friday night it was the worst.   I started with a headache then my whole eye is blurred.   It lasted about 10 minutes then clears up. 2. ONSET: "When did this start?" (minutes, hours, days; while sleeping)     About a month ago.   I've had 4 spells of it. 3. LAST NORMAL: "When was the last time you were normal (no symptoms)?"     I'm fine right now. 4. PATTERN "Does this come and go, or has it been constant since it started?"  "Is it present now?"     It has happened on Friday  Night is the last time. 5. CARDIAC SYMPTOMS: "Have you had any of the following symptoms: chest pain, difficulty breathing, palpitations?"     I've had heart palpitations.    6. NEUROLOGIC SYMPTOMS: "Have you had any of the following symptoms: headache, dizziness, vision loss, double vision, changes in speech, unsteady on your feet?"     My mother's side of family had a lot of strokes.   No dizziness.  "It's weird".   It happens over 5-10 minutes then I'm normal.   7. OTHER  SYMPTOMS: "Do you have any other symptoms?"     I'm able to walk fine when it's happening. 8. PREGNANCY: "Is there any chance you are pregnant?" "When was your last menstrual period?"      Not asked due age.  Answer Assessment - Initial Assessment Questions 1. DESCRIPTION: "What is the vision loss like? Describe it for me." (e.g., complete vision loss, blurred vision, double vision, floaters, etc.)     It involves my right eye.   Over the last month I've had 3-4 episodes where I get a headache, then it goes away then the vision in my right eye is blurred.  It lasts about 5-10 minutes then goes away.   I feel fine now.  Last episode was Friday night. 2. LOCATION: "One or both eyes?" If one, ask: "Which eye?"     Right eye 3. SEVERITY: "Can you see anything?" If so, ask: "What can you see?" (e.g., fine print)     My vision is blurred for 5-10 minutes then clears up. 4. ONSET: "When did this begin?" "Did it start suddenly or has this been gradual?"     About a month ago. 5. PATTERN: "Does this come and go, or has it been  constant since it started?"     It has occurred about 3-4 times over the last month.   It lasts about 5-10 minutes then clears up. 6. PAIN: "Is there any pain in your eye(s)?"  (Scale 1-10; or mild, moderate, severe)     No.   It starts with a headache that goes away then the vision in my right eye is blurred for 5-10 minutes then clears up. 7. CONTACTS-GLASSES: "Do you wear contacts or glasses?"     Not asked 8. CAUSE: "What do you think is causing this visual problem?"     I don't know 9. OTHER SYMPTOMS: "Do you have any other symptoms?" (e.g., confusion, headache, arm or leg weakness, speech problems)     Denies any other symptoms when these episodes occur. 10. PREGNANCY: "Is there any chance you are pregnant?" "When was your last menstrual period?"       Not asked due to age.  Protocols used: VISION LOSS OR CHANGE-A-AH, NEUROLOGIC DEFICIT-A-AH

## 2018-06-23 NOTE — ED Triage Notes (Signed)
Pt reporting 4 separate episodes of vision loss in her right eye that have lasted up to 10 minutes. Headaches have also been intermittent in nature. Pt denies vision changes at this time. No hx of stroke. No blood thinners. No other neuro deficits reported by pt or pts husband. Pt reports these episodes have been intermittent for the past month. PCP sent pt to rule out TIA.

## 2018-06-23 NOTE — Telephone Encounter (Signed)
She is in the ER now. Do you want to wait and see what they say?

## 2018-06-23 NOTE — Telephone Encounter (Signed)
Please call and offer her 2:45PM today We can try to get the other patient in tomorrow or at end of day today if you can reach him

## 2018-06-24 ENCOUNTER — Telehealth: Payer: Self-pay

## 2018-06-24 NOTE — Telephone Encounter (Signed)
Spoke to pt's husband. He said she was doing well. I advised him that an OV was not needed unless she continued to have issues. Her ER workup looked reassuring.

## 2018-06-30 ENCOUNTER — Other Ambulatory Visit: Payer: Self-pay | Admitting: Internal Medicine

## 2018-07-01 ENCOUNTER — Encounter: Payer: Self-pay | Admitting: Family Medicine

## 2018-07-01 ENCOUNTER — Ambulatory Visit (INDEPENDENT_AMBULATORY_CARE_PROVIDER_SITE_OTHER): Payer: Medicare Other | Admitting: Family Medicine

## 2018-07-01 VITALS — BP 146/78 | HR 72 | Temp 98.1°F | Ht 65.0 in | Wt 156.5 lb

## 2018-07-01 DIAGNOSIS — N952 Postmenopausal atrophic vaginitis: Secondary | ICD-10-CM

## 2018-07-01 DIAGNOSIS — N898 Other specified noninflammatory disorders of vagina: Secondary | ICD-10-CM

## 2018-07-01 LAB — POCT WET PREP (WET MOUNT): TRICHOMONAS WET PREP HPF POC: ABSENT

## 2018-07-01 MED ORDER — CLOBETASOL PROPIONATE 0.05 % EX OINT: TOPICAL_OINTMENT | CUTANEOUS | 3 refills | Status: DC

## 2018-07-01 NOTE — Assessment & Plan Note (Signed)
Cut back to twice weekly for estrogen vaginal cream

## 2018-07-01 NOTE — Telephone Encounter (Deleted)
Received faxed refill request for hydrochlorothiazide (MICROZIDE) 12.5 MG capsule  Tried to call patient but could leave message. On patient's current medication list it stated under this Rx that it makes patient itchy. Need to confirm with patient.

## 2018-07-01 NOTE — Progress Notes (Signed)
Subjective:    Patient ID: Dominique Hall, female    DOB: November 13, 1934, 83 y.o.   MRN: [id number]  HPI Here for c/o vaginal itching  Going on about 6 mo  Itching  Is more external than internal and anterior  It comes and goes  Just aggrivating/not painful  No vaginal d/c  No dysuria or change in urinary symptoms Not sexually active since 2006- husband had prostate cancer   Went to obgyn at [place] for similar symptoms in the past  They took a biopsy of vulva -  "came back negative"  Had been given clobetasol cream for dermatitis  ? What she diagnosed her (atrophic vaginitis)- estrogen cream  Using twice daily now am and pm now - not sure if it is helping  She has had a hysterectomy   Uses estrace vaginal cream for atrophic vaginitis   Wet prep today  Results for orders placed or performed in visit on 07/01/18  POCT Wet Prep [facility])  Result Value Ref Range   Source Wet Prep POC vaginal    WBC, Wet Prep HPF POC mod    Bacteria Wet Prep HPF POC Few Few   BACTERIA WET PREP MORPHOLOGY POC     Clue Cells Wet Prep HPF POC None None   Clue Cells Wet Prep Whiff POC     Yeast Wet Prep HPF POC None None   KOH Wet Prep POC None None   Trichomonas Wet Prep HPF POC Absent Absent      Patient Active Problem List   Diagnosis Date Noted  . Vaginal itching 07/01/2018  . Dermatitis 05/22/2018  . Left knee pain 12/14/2016  . Solitary pulmonary nodule 12/22/2015  . Advance directive discussed with patient 03/17/2015  . Atrophic vaginitis 01/29/2014  . Cystocele, grade 1-2 01/29/2014  . Fibrocystic breast changes 12/05/2012  . Routine general medical examination at a health care facility 08/14/2011  . Mild mood disorder ([place]) 04/15/2007  . Essential hypertension, benign 04/15/2007  . ALLERGIC RHINITIS 04/15/2007  . GERD 04/15/2007  . DIVERTICULOSIS, COLON 04/15/2007  . OSTEOPENIA 04/15/2007   Past Medical History:  Diagnosis Date  . Allergy   . Arthritis   .  Diverticulosis   . Esophageal dysmotility   . Esophageal ring   . GERD (gastroesophageal reflux disease)   . Hiatal hernia   . HTN (hypertension)   . IBS (irritable bowel syndrome)   . Osteopenia   . Renal disorder   . Sleep disturbance    Past Surgical History:  Procedure Laterality Date  . ABDOMINAL HYSTERECTOMY    . APPENDECTOMY  1973  . BREAST EXCISIONAL BIOPSY Right   . CHOLECYSTECTOMY    . ERCP N/A 11/22/2017   Procedure: ENDOSCOPIC RETROGRADE CHOLANGIOPANCREATOGRAPHY (ERCP);  Surgeon: Lucilla Lame, MD;  Location: [facility] ENDOSCOPY;  Service: Endoscopy;  Laterality: N/A;  . ESOPHAGOGASTRODUODENOSCOPY  07-2006   with dilation  . [place]  . lumpectomy  [phone]   benign right breast  . SQUAMOUS CELL CARCINOMA EXCISION  6/12   Right leg--Dr Pat Patrick  . SQUAMOUS CELL CARCINOMA EXCISION  2012   right calf  . TOTAL ABDOMINAL HYSTERECTOMY W/ BILATERAL SALPINGOOPHORECTOMY  1973   Social History   Tobacco Use  . Smoking status: Former Research scientist (life sciences)  . Smokeless tobacco: Never Used  Substance Use Topics  . Alcohol use: Yes    Comment: occ  . Drug use: No   Family History  Problem Relation Age of Onset  .  Heart attack Father   . Stroke Mother   . Melanoma Daughter    Allergies  Allergen Reactions  . Penicillins     Has patient had a PCN reaction causing immediate rash, facial/tongue/throat swelling, SOB or lightheadedness with hypotension: Unknown Has patient had a PCN reaction causing severe rash involving mucus membranes or skin necrosis: Unknown Has patient had a PCN reaction that required hospitalization: Unknown Has patient had a PCN reaction occurring within the last 10 years: Unknown If all of the above answers are "NO", then may proceed with Cephalosporin use.  . Rabeprazole Sodium     REACTION: constipated  . Sulfonamide Derivatives     REACTION: unspecified  . Cefuroxime     diarrhea   Current Outpatient Medications on File Prior to Visit  Medication Sig  Dispense Refill  . clorazepate (TRANXENE) 7.5 MG tablet Take 1 to 2 tablets at bedtime as needed. 60 tablet 0  . estradiol (ESTRACE) 0.1 MG/GM vaginal cream Place 1 Applicatorful vaginally 2 (two) times daily.    . hydrochlorothiazide (MICROZIDE) 12.5 MG capsule TAKE 1 CAPSULE EVERY       MORNING 90 capsule 3  . meloxicam (MOBIC) 7.5 MG tablet TAKE 1 TABLET BY MOUTH EVERY DAY 30 tablet 5  . Multiple Vitamin (MULTIVITAMIN) tablet Take 1 tablet by mouth daily.    Marland Kitchen NEXIUM 40 MG capsule Take 1 capsule (40 mg total) by mouth daily. 90 capsule 3  . Omega-3 Fatty Acids (FISH OIL PO) Take 1 capsule by mouth daily.    Marland Kitchen triamcinolone cream (KENALOG) 0.1 % Apply 1 application topically 2 (two) times daily. To itchy/rash areas 30 g 0  . vitamin C (ASCORBIC ACID) 500 MG tablet Take 500 mg by mouth daily.    Marland Kitchen ZANTAC 150 MG tablet Take 1 tablet (150 mg total) by mouth at bedtime. 90 tablet 3  . ZOFRAN 4 MG tablet Take 1 tablet (4 mg total) by mouth every 8 (eight) hours as needed for nausea or vomiting. 20 tablet 0   No current facility-administered medications on file prior to visit.     Review of Systems  Constitutional: Negative for activity change, appetite change, fatigue, fever and unexpected weight change.  HENT: Negative for congestion, ear pain, rhinorrhea, sinus pressure and sore throat.   Eyes: Negative for pain, redness and visual disturbance.  Respiratory: Negative for cough, shortness of breath and wheezing.   Cardiovascular: Negative for chest pain and palpitations.  Gastrointestinal: Negative for abdominal pain, blood in stool, constipation and diarrhea.  Endocrine: Negative for polydipsia and polyuria.  Genitourinary: Negative for dysuria, frequency, genital sores, hematuria, pelvic pain, urgency, vaginal discharge and vaginal pain.       Vaginal itch  Musculoskeletal: Negative for arthralgias, back pain and myalgias.  Skin: Negative for pallor and rash.  Allergic/Immunologic:  Negative for environmental allergies.  Neurological: Negative for dizziness, syncope and headaches.  Hematological: Negative for adenopathy. Does not bruise/bleed easily.  Psychiatric/Behavioral: Negative for decreased concentration and dysphoric mood. The patient is not nervous/anxious.        Objective:   Physical Exam  Constitutional: She appears well-developed and well-nourished.  Well appearing  HENT:  Head: Normocephalic and atraumatic.  Mouth/Throat: Oropharynx is clear and moist.  Eyes: Pupils are equal, round, and reactive to light. Conjunctivae are normal.  Neck: Normal range of motion.  Cardiovascular: Normal rate and regular rhythm.  Pulmonary/Chest: Effort normal and breath sounds normal.  Abdominal: Soft. Bowel sounds are normal. She exhibits no  distension. There is no tenderness.  No suprapubic tenderness or fullness    Genitourinary: No vaginal discharge found.  Genitourinary Comments: Mild hyperemia and swelling of labia minora  Nl appearing vaginal mucosa  No evidence of lichen sclerosis No vaginal d/c or excoriation or rash   Musculoskeletal: She exhibits no edema.  Neurological: She is alert.  Skin: No rash noted.  Psychiatric: She has a normal mood and affect.          Assessment & Plan:   Problem List Items Addressed This Visit      Musculoskeletal and Integument   Vaginal itching - Primary    Acute on chronic  Diagnosed with atrophic vaginitis and dermatitis in the past by Dr Hulan Fray Rev vulvar bx from 2015-neg  Exam does not resemble lichen sclerosis Wet prep unremarkable  No sexually active  Will continue vaginal estrogen twice weekly  Add clobetasol ointment which worked well in past-tapering dose  F/u or ref to gyn if no imp  >25 minutes spent in face to face time with patient, >50% spent in counselling or coordination of care-including review of past medical records and studies from gyn       Relevant Orders   POCT Wet Prep [facility])  (Completed)     Genitourinary   Atrophic vaginitis    Cut back to twice weekly for estrogen vaginal cream

## 2018-07-01 NOTE — Patient Instructions (Addendum)
Use the estrogen cream twice weekly   I will px clobetasol (worked for you in the past)  This is for itching and irritation  Use as directed   Update if not starting to improve in a week or if worsening

## 2018-07-01 NOTE — Assessment & Plan Note (Signed)
Acute on chronic  Diagnosed with atrophic vaginitis and dermatitis in the past by Dr Hulan Fray Rev vulvar bx from 2015-neg  Exam does not resemble lichen sclerosis Wet prep unremarkable  No sexually active  Will continue vaginal estrogen twice weekly  Add clobetasol ointment which worked well in past-tapering dose  F/u or ref to gyn if no imp  >25 minutes spent in face to face time with patient, >50% spent in counselling or coordination of care-including review of past medical records and studies from gyn

## 2018-07-07 ENCOUNTER — Other Ambulatory Visit: Payer: Self-pay

## 2018-07-07 MED ORDER — HYDROCHLOROTHIAZIDE 12.5 MG PO CAPS: 12.5000 mg | ORAL_CAPSULE | Freq: Every morning | ORAL | 1 refills | Status: DC

## 2018-07-23 ENCOUNTER — Other Ambulatory Visit: Payer: Self-pay | Admitting: Internal Medicine

## 2018-08-24 ENCOUNTER — Other Ambulatory Visit: Payer: Self-pay | Admitting: Internal Medicine

## 2018-08-24 DIAGNOSIS — G4700 Insomnia, unspecified: Secondary | ICD-10-CM

## 2018-08-25 NOTE — Telephone Encounter (Signed)
Last office visit 07/01/2018 with Dr. Glori Bickers for Vaginal Itching. Next Appt:  04/02/2019 for AWV.  Last refill 03/31/2018 for #60 with no refills.

## 2018-08-26 NOTE — Telephone Encounter (Signed)
Rx called in as prescribed 

## 2018-08-26 NOTE — Telephone Encounter (Signed)
Refilled in PCP absence  

## 2018-08-26 NOTE — Telephone Encounter (Signed)
Forwarding to Dr Glori Bickers in Dr Alla German absence

## 2018-09-04 ENCOUNTER — Ambulatory Visit: Payer: Medicare Other

## 2018-09-18 ENCOUNTER — Ambulatory Visit (INDEPENDENT_AMBULATORY_CARE_PROVIDER_SITE_OTHER): Payer: Medicare Other

## 2018-09-18 DIAGNOSIS — Z23 Encounter for immunization: Secondary | ICD-10-CM

## 2018-10-27 ENCOUNTER — Telehealth: Payer: Self-pay | Admitting: Internal Medicine

## 2018-10-27 DIAGNOSIS — K219 Gastro-esophageal reflux disease without esophagitis: Secondary | ICD-10-CM

## 2018-10-27 NOTE — Telephone Encounter (Signed)
Patient came by the office wanting a prescription of Zantac 150mg  because they are unable to get it over the counter anymore. Patient advised that Dr. Silvio Pate is out of the office this week and it may not be taken care of right away.  Et[facility] [place] in [place]  Please advise

## 2018-10-28 MED ORDER — RANITIDINE HCL 150 MG PO TABS
150.0000 mg | ORAL_TABLET | Freq: Every day | ORAL | 3 refills | Status: DC
Start: 2018-10-28 — End: 2019-05-06

## 2018-10-28 NOTE — Telephone Encounter (Signed)
Rx sent electronically.  

## 2018-12-09 ENCOUNTER — Telehealth: Payer: Self-pay

## 2018-12-09 ENCOUNTER — Other Ambulatory Visit: Payer: Self-pay | Admitting: Family Medicine

## 2018-12-09 DIAGNOSIS — G4700 Insomnia, unspecified: Secondary | ICD-10-CM

## 2018-12-09 MED ORDER — CLORAZEPATE DIPOTASSIUM 7.5 MG PO TABS: ORAL_TABLET | ORAL | 0 refills | Status: DC

## 2018-12-09 NOTE — Telephone Encounter (Signed)
Pt wants to know if you would recommend the pt to receive the new shingles vaccine. Pt request cb. [facility]. Pt request cb.

## 2018-12-09 NOTE — Telephone Encounter (Signed)
Spoke to pt

## 2018-12-09 NOTE — Telephone Encounter (Signed)
Yes--they can just ask the pharmacist. There may be a waiting list

## 2018-12-09 NOTE — Telephone Encounter (Signed)
Last filled 09-27-18 #60 Last OV Acute 07-01-18 Next OV 04-09-19 [facility]

## 2018-12-15 ENCOUNTER — Other Ambulatory Visit: Payer: Self-pay | Admitting: Internal Medicine

## 2018-12-15 DIAGNOSIS — Z1231 Encounter for screening mammogram for malignant neoplasm of breast: Secondary | ICD-10-CM

## 2019-01-01 ENCOUNTER — Other Ambulatory Visit: Payer: Self-pay | Admitting: Internal Medicine

## 2019-01-09 ENCOUNTER — Telehealth: Payer: Self-pay | Admitting: Internal Medicine

## 2019-01-09 NOTE — Telephone Encounter (Signed)
Error

## 2019-01-13 DIAGNOSIS — L578 Other skin changes due to chronic exposure to nonionizing radiation: Secondary | ICD-10-CM | POA: Diagnosis not present

## 2019-01-13 DIAGNOSIS — L986 Other infiltrative disorders of the skin and subcutaneous tissue: Secondary | ICD-10-CM | POA: Diagnosis not present

## 2019-01-13 DIAGNOSIS — L821 Other seborrheic keratosis: Secondary | ICD-10-CM | POA: Diagnosis not present

## 2019-01-13 DIAGNOSIS — R21 Rash and other nonspecific skin eruption: Secondary | ICD-10-CM | POA: Diagnosis not present

## 2019-01-19 ENCOUNTER — Ambulatory Visit (INDEPENDENT_AMBULATORY_CARE_PROVIDER_SITE_OTHER): Payer: Medicare Other | Admitting: Family Medicine

## 2019-01-19 ENCOUNTER — Encounter: Payer: Self-pay | Admitting: Family Medicine

## 2019-01-19 VITALS — BP 104/60 | HR 81 | Temp 97.6°F | Resp 16 | Ht 65.0 in | Wt 152.5 lb

## 2019-01-19 DIAGNOSIS — R059 Cough, unspecified: Secondary | ICD-10-CM

## 2019-01-19 DIAGNOSIS — R0981 Nasal congestion: Secondary | ICD-10-CM | POA: Diagnosis not present

## 2019-01-19 DIAGNOSIS — R05 Cough: Secondary | ICD-10-CM | POA: Diagnosis not present

## 2019-01-19 DIAGNOSIS — B9789 Other viral agents as the cause of diseases classified elsewhere: Secondary | ICD-10-CM

## 2019-01-19 DIAGNOSIS — J069 Acute upper respiratory infection, unspecified: Secondary | ICD-10-CM | POA: Diagnosis not present

## 2019-01-19 LAB — POC INFLUENZA A&B (BINAX/QUICKVUE)
INFLUENZA A, POC: NEGATIVE
INFLUENZA B, POC: NEGATIVE

## 2019-01-19 MED ORDER — BENZONATATE 100 MG PO CAPS: 100.0000 mg | ORAL_CAPSULE | Freq: Three times a day (TID) | ORAL | 0 refills | Status: DC | PRN

## 2019-01-19 NOTE — Patient Instructions (Signed)
Good to see you today  I think you have a viral upper respiratory illness  Your flu test was negative  Drink plenty of fluids, I have sent in a prescription for a cough pill to your pharmacy. Can also take plain Mucinex (generic is fine) if needed for nasal or chest congestion. Get extra rest.   If not better in 5-7 days or if worsening symptoms, continued fever- please follow up

## 2019-01-19 NOTE — Progress Notes (Signed)
Subjective:    Patient ID: Dominique Hall, female    DOB: 1935/06/23, 84 y.o.   MRN: [id number]  HPI This is an 84 yo female, accompanied by her husband, who presents today with cough x 2 days, worse yesterday and last night. Non productive. Some watery, runny nose yesterday, now yellow. Feels SOB with cough and activity, no wheeze. No headache. No ear pain. No muscle aches. Took Robitussin DM for cough with some relief.  Temp to 100.7 yesterday. Has not taken anything for fever/pain. No fever today. No known sick contacts. Husband with similar symptoms that started this am.   Past Medical History:  Diagnosis Date  . Allergy   . Arthritis   . Diverticulosis   . Esophageal dysmotility   . Esophageal ring   . GERD (gastroesophageal reflux disease)   . Hiatal hernia   . HTN (hypertension)   . IBS (irritable bowel syndrome)   . Osteopenia   . Renal disorder   . Sleep disturbance    Past Surgical History:  Procedure Laterality Date  . ABDOMINAL HYSTERECTOMY    . APPENDECTOMY  1973  . BREAST EXCISIONAL BIOPSY Right   . CHOLECYSTECTOMY    . ERCP N/A 11/22/2017   Procedure: ENDOSCOPIC RETROGRADE CHOLANGIOPANCREATOGRAPHY (ERCP);  Surgeon: Lucilla Lame, MD;  Location: [facility] ENDOSCOPY;  Service: Endoscopy;  Laterality: N/A;  . ESOPHAGOGASTRODUODENOSCOPY  07-2006   with dilation  . [place]  . lumpectomy  [phone]   benign right breast  . SQUAMOUS CELL CARCINOMA EXCISION  6/12   Right leg--Dr Pat Patrick  . SQUAMOUS CELL CARCINOMA EXCISION  2012   right calf  . TOTAL ABDOMINAL HYSTERECTOMY W/ BILATERAL SALPINGOOPHORECTOMY  1973   Family History  Problem Relation Age of Onset  . Heart attack Father   . Stroke Mother   . Melanoma Daughter    Social History   Tobacco Use  . Smoking status: Former Research scientist (life sciences)  . Smokeless tobacco: Never Used  Substance Use Topics  . Alcohol use: Yes    Comment: occ  . Drug use: No      Review of Systems     Objective:   Physical  Exam Vitals signs reviewed.  Constitutional:      General: She is not in acute distress.    Appearance: Normal appearance. She is normal weight. She is not ill-appearing, toxic-appearing or diaphoretic.  HENT:     Head: Normocephalic and atraumatic.     Right Ear: Tympanic membrane, ear canal and external ear normal.     Left Ear: Tympanic membrane, ear canal and external ear normal.     Nose: Nose normal.     Mouth/Throat:     Mouth: Mucous membranes are moist.     Pharynx: Oropharynx is clear.  Eyes:     Conjunctiva/sclera: Conjunctivae normal.  Cardiovascular:     Rate and Rhythm: Normal rate and regular rhythm.     Heart sounds: Normal heart sounds.  Pulmonary:     Effort: Pulmonary effort is normal.     Breath sounds: Normal breath sounds.  Neurological:     Mental Status: She is alert and oriented to person, place, and time.  Psychiatric:        Mood and Affect: Mood normal.        Behavior: Behavior normal.        Thought Content: Thought content normal.        Judgment: Judgment normal.      BP 104/60 (  BP Location: Left Arm, Patient Position: Sitting)   Pulse 81   Temp 97.6 F (36.4 C) (Oral)   Resp 16   Ht 5\' 5"  (1.651 m)   Wt 152 lb 8 oz (69.2 kg)   SpO2 93%   BMI 25.38 kg/m  Wt Readings from Last 3 Encounters:  01/19/19 152 lb 8 oz (69.2 kg)  07/01/18 156 lb 8 oz (71 kg)  06/23/18 152 lb (68.9 kg)   Recheck pulse ox 96% Results for orders placed or performed in visit on 01/19/19  POC Influenza A&B (Binax test)  Result Value Ref Range   Influenza A, POC Negative Negative   Influenza B, POC Negative Negative      Assessment & Plan:  1. Cough - POC Influenza A&B (Binax test)  2. Nasal congestion - POC Influenza A&B (Binax test)  3. Viral URI with cough - suspect viral etiology - Provided written and verbal information regarding diagnosis and treatment. - benzonatate (TESSALON) 100 MG capsule; Take 1-2 capsules (100-200 mg total) by mouth 3  (three) times daily as needed.  Dispense: 30 capsule; Refill: 0 -  Patient Instructions  Good to see you today  I think you have a viral upper respiratory illness  Your flu test was negative  Drink plenty of fluids, I have sent in a prescription for a cough pill to your pharmacy. Can also take plain Mucinex (generic is fine) if needed for nasal or chest congestion. Get extra rest.   If not better in 5-7 days or if worsening symptoms, continued fever- please follow up      Clarene Reamer, FNP-BC  [facility] [place]  01/19/2019 8:21 PM

## 2019-01-20 ENCOUNTER — Telehealth: Payer: Self-pay

## 2019-01-20 DIAGNOSIS — R21 Rash and other nonspecific skin eruption: Secondary | ICD-10-CM | POA: Diagnosis not present

## 2019-01-20 NOTE — Telephone Encounter (Signed)
Pt was seen on 01/19/19; this morning pt has slight lt earache and when pt talks she feels like she is "in a barrel". Other symptoms are the same as when seen on 01/19/19. No fever. Pt wants to be more aggressive and have abx sent to [place]. Pt said she has been sick since 01/16/19. Pt request cb after Glenda Chroman FNP review.

## 2019-01-20 NOTE — Telephone Encounter (Signed)
Called patient and spoke with her about her symptoms. She is sleeping well, good relief with tessalon. She does not have a fever. She continues to have yellow nasal drainage. Feels much better as day has gone on. Worse day was  yesterday. Discussed indications for steroids and antibiotics and that I don't think she needs either at this point. Reminded her to follow up if she develops a fever, wheeze or worsening congestion.

## 2019-01-20 NOTE — Telephone Encounter (Signed)
LVM for patient at [phone] (home)  to call office back

## 2019-01-20 NOTE — Telephone Encounter (Signed)
I still believe that her symptoms are from a virus and an antibiotic will not make her get better sooner and potentially cause side effects like diarrhea, vomiting, or an intestinal infection. Continue symptomatic treatment as she is doing, lots of fluids and rest.

## 2019-01-20 NOTE — Telephone Encounter (Signed)
Patient is aware. She stated that "typically" she is always given an antibiotic. She said her "friends with the same symptoms" have been given steroids and they are much better. She is requesting a antibiotic or at least a steroid taper pack be sent in. Patient is aware that she is diagnosed with a virus and that each person responds and heals at different rates of time. Patient is persistent.

## 2019-01-24 ENCOUNTER — Other Ambulatory Visit: Payer: Self-pay | Admitting: Internal Medicine

## 2019-01-29 ENCOUNTER — Other Ambulatory Visit: Payer: Self-pay | Admitting: Family Medicine

## 2019-01-29 DIAGNOSIS — B9789 Other viral agents as the cause of diseases classified elsewhere: Principal | ICD-10-CM

## 2019-01-29 DIAGNOSIS — J069 Acute upper respiratory infection, unspecified: Secondary | ICD-10-CM

## 2019-01-29 MED ORDER — BENZONATATE 100 MG PO CAPS: 100.0000 mg | ORAL_CAPSULE | Freq: Three times a day (TID) | ORAL | 0 refills | Status: DC | PRN

## 2019-01-30 ENCOUNTER — Ambulatory Visit: Payer: Medicare Other

## 2019-01-31 ENCOUNTER — Other Ambulatory Visit: Payer: Self-pay | Admitting: Family Medicine

## 2019-01-31 DIAGNOSIS — B9789 Other viral agents as the cause of diseases classified elsewhere: Principal | ICD-10-CM

## 2019-01-31 DIAGNOSIS — J069 Acute upper respiratory infection, unspecified: Secondary | ICD-10-CM

## 2019-02-11 ENCOUNTER — Other Ambulatory Visit: Payer: Self-pay

## 2019-02-12 MED ORDER — MELOXICAM 7.5 MG PO TABS: 7.5000 mg | ORAL_TABLET | Freq: Every day | ORAL | 0 refills | Status: DC

## 2019-02-26 ENCOUNTER — Ambulatory Visit: Payer: Medicare Other

## 2019-02-28 ENCOUNTER — Other Ambulatory Visit: Payer: Self-pay | Admitting: Internal Medicine

## 2019-02-28 DIAGNOSIS — G4700 Insomnia, unspecified: Secondary | ICD-10-CM

## 2019-03-02 NOTE — Telephone Encounter (Signed)
Last filled 01-14-19 #60 Last OV Acute 01-19-19 Next OV 04-09-19 [facility]

## 2019-04-02 ENCOUNTER — Encounter: Payer: Medicare Other | Admitting: Internal Medicine

## 2019-04-08 ENCOUNTER — Ambulatory Visit: Payer: Medicare Other

## 2019-04-09 ENCOUNTER — Encounter: Payer: Medicare Other | Admitting: Internal Medicine

## 2019-04-30 ENCOUNTER — Ambulatory Visit
Admission: RE | Admit: 2019-04-30 | Discharge: 2019-04-30 | Disposition: A | Discharge status: Home / Self Care | Payer: Medicare Other | Source: Ambulatory Visit | Attending: Internal Medicine | Admitting: Internal Medicine

## 2019-04-30 ENCOUNTER — Other Ambulatory Visit: Payer: Self-pay

## 2019-04-30 DIAGNOSIS — Z1231 Encounter for screening mammogram for malignant neoplasm of breast: Secondary | ICD-10-CM

## 2019-05-06 ENCOUNTER — Other Ambulatory Visit: Payer: Self-pay

## 2019-05-06 ENCOUNTER — Encounter: Payer: Self-pay | Admitting: Internal Medicine

## 2019-05-06 ENCOUNTER — Other Ambulatory Visit: Payer: Self-pay | Admitting: Family Medicine

## 2019-05-06 ENCOUNTER — Other Ambulatory Visit: Payer: Self-pay | Admitting: Internal Medicine

## 2019-05-06 ENCOUNTER — Ambulatory Visit (INDEPENDENT_AMBULATORY_CARE_PROVIDER_SITE_OTHER): Payer: Medicare Other | Admitting: Internal Medicine

## 2019-05-06 VITALS — BP 124/78 | HR 62 | Temp 97.8°F | Ht 64.0 in | Wt 154.0 lb

## 2019-05-06 DIAGNOSIS — Z7189 Other specified counseling: Secondary | ICD-10-CM

## 2019-05-06 DIAGNOSIS — F39 Unspecified mood [affective] disorder: Secondary | ICD-10-CM

## 2019-05-06 DIAGNOSIS — I10 Essential (primary) hypertension: Secondary | ICD-10-CM

## 2019-05-06 DIAGNOSIS — Z0000 Encounter for general adult medical examination without abnormal findings: Secondary | ICD-10-CM | POA: Diagnosis not present

## 2019-05-06 DIAGNOSIS — K219 Gastro-esophageal reflux disease without esophagitis: Secondary | ICD-10-CM

## 2019-05-06 DIAGNOSIS — M159 Polyosteoarthritis, unspecified: Secondary | ICD-10-CM | POA: Insufficient documentation

## 2019-05-06 DIAGNOSIS — G4700 Insomnia, unspecified: Secondary | ICD-10-CM

## 2019-05-06 LAB — COMPREHENSIVE METABOLIC PANEL
ALT: 13 U/L (ref 0–35)
AST: 14 U/L (ref 0–37)
Albumin: 4.3 g/dL (ref 3.5–5.2)
Alkaline Phosphatase: 69 U/L (ref 39–117)
BUN: 22 mg/dL (ref 6–23)
CO2: 29 mEq/L (ref 19–32)
Calcium: 9.3 mg/dL (ref 8.4–10.5)
Chloride: 104 mEq/L (ref 96–112)
Creatinine, Ser: 0.71 mg/dL (ref 0.40–1.20)
GFR: 78.79 mL/min (ref >60.00)
Glucose, Bld: 86 mg/dL (ref 70–99)
Potassium: 4.2 mEq/L (ref 3.5–5.1)
Sodium: 143 mEq/L (ref 135–145)
Total Bilirubin: 0.9 mg/dL (ref 0.2–1.2)
Total Protein: 6.2 g/dL (ref 6.0–8.3)

## 2019-05-06 LAB — CBC
HCT: 37.7 % (ref 36.0–46.0)
Hemoglobin: 12.9 g/dL (ref 12.0–15.0)
MCHC: 34.3 g/dL (ref 30.0–36.0)
MCV: 85.6 fl (ref 78.0–100.0)
Platelets: 167 10*3/uL (ref 150.0–400.0)
RBC: 4.4 Mil/uL (ref 3.87–5.11)
RDW: 13.4 % (ref 11.5–15.5)
WBC: 4.9 10*3/uL (ref 4.0–10.5)

## 2019-05-06 NOTE — Assessment & Plan Note (Signed)
I have personally reviewed the Medicare Annual Wellness questionnaire and have noted 1. The patient's medical and social history 2. Their use of alcohol, tobacco or illicit drugs 3. Their current medications and supplements 4. The patient's functional ability including ADL's, fall risks, home safety risks and hearing or visual             impairment. 5. Diet and physical activities 6. Evidence for depression or mood disorders  The patients weight, height, BMI and visual acuity have been recorded in the chart I have made referrals, counseling and provided education to the patient based review of the above and I have provided the pt with a written personalized care plan for preventive services.  I have provided you with a copy of your personalized plan for preventive services. Please take the time to review along with your updated medication list.  Flu vaccine in the fall Due for second shingrix Discussed fitness No cancer screening due to age

## 2019-05-06 NOTE — Assessment & Plan Note (Signed)
Still with some anxiety and mild panic Uses the clorazepate prn

## 2019-05-06 NOTE — Assessment & Plan Note (Signed)
Gets good relief with meloxicam Will check renal function

## 2019-05-06 NOTE — Assessment & Plan Note (Signed)
See social history 

## 2019-05-06 NOTE — Progress Notes (Signed)
Hearing Screening   125Hz  250Hz  500Hz  1000Hz  2000Hz  3000Hz  4000Hz  6000Hz  8000Hz   Right ear:           Left ear:           Comments: Has hearing aids. Wearing them today.  Vision Screening Comments: July 2019

## 2019-05-06 NOTE — Assessment & Plan Note (Signed)
Symptoms controlled with PPI Also for gastroprotection

## 2019-05-06 NOTE — Progress Notes (Signed)
Subjective:    Patient ID: Dominique Hall, female    DOB: [birth year], 84 y.o.   MRN: [id number]  HPI Here for Medicare wellness visit and follow up of chronic health conditions Reviewed form and advanced directives Reviewed other doctors Occasional glass of wine No tobacco Tries to walk daily Vision is okay Hearing aides do help her No falls No depression or anhedonia Independent with instrumental ADLs No sig memory issues  For about 6 months--she has noticed spots on her skin Red inflamed papules that eventually settle down Did see Dr Kathlen Brunswick consistent with mites  Had home fumigated since then Using benedryl gel which helps the itching Discussed trying the clobetasol as well  Still gets anxious or uptight at times No severe panic type symptoms Still uses the clorazepate occasionally  Continues on the meloxicam daily Helps the arthritis No GI symptoms with this Hasn't tolerated weaning this  Continues on nexium No heartburn or pain with this Mild dysphagia with big pills--but no problems with food  Current Outpatient Medications on File Prior to Visit  Medication Sig Dispense Refill  . clobetasol ointment (TEMOVATE) 0.05 % Apply to vulvar area daily at bedtime for 4 weeks, then every other day for 2 weeks, then twice weekly for 2 weeks then stop 30 g 3  . clorazepate (TRANXENE) 7.5 MG tablet TAKE 1-2 TABLETS BY MOUTH AT BEDTIME AS NEEDED 60 tablet 0  . estradiol (ESTRACE) 0.1 MG/GM vaginal cream Place 1 Applicatorful vaginally 2 (two) times a week.     . hydrochlorothiazide (MICROZIDE) 12.5 MG capsule TAKE 1 CAPSULE BY MOUTH EVERY DAY IN THE MORNING 90 capsule 1  . meloxicam (MOBIC) 7.5 MG tablet Take 1 tablet (7.5 mg total) by mouth daily. 90 tablet 0  . Multiple Vitamin (MULTIVITAMIN) tablet Take 1 tablet by mouth daily.    Marland Kitchen NEXIUM 40 MG capsule Take 1 capsule (40 mg total) by mouth daily. 90 capsule 3  . Omega-3 Fatty Acids (FISH OIL PO) Take 1  capsule by mouth daily.    Marland Kitchen triamcinolone cream (KENALOG) 0.1 % Apply 1 application topically 2 (two) times daily. To itchy/rash areas 30 g 0  . vitamin C (ASCORBIC ACID) 500 MG tablet Take 500 mg by mouth daily.     No current facility-administered medications on file prior to visit.     Allergies  Allergen Reactions  . Penicillins     Has patient had a PCN reaction causing immediate rash, facial/tongue/throat swelling, SOB or lightheadedness with hypotension: Unknown Has patient had a PCN reaction causing severe rash involving mucus membranes or skin necrosis: Unknown Has patient had a PCN reaction that required hospitalization: Unknown Has patient had a PCN reaction occurring within the last 10 years: Unknown If all of the above answers are "NO", then may proceed with Cephalosporin use.  . Rabeprazole Sodium     REACTION: constipated  . Sulfonamide Derivatives     REACTION: unspecified  . Cefuroxime     diarrhea    Past Medical History:  Diagnosis Date  . Allergy   . Arthritis   . Diverticulosis   . Esophageal dysmotility   . Esophageal ring   . GERD (gastroesophageal reflux disease)   . Hiatal hernia   . HTN (hypertension)   . IBS (irritable bowel syndrome)   . Osteopenia   . Renal disorder   . Sleep disturbance     Past Surgical History:  Procedure Laterality Date  . ABDOMINAL HYSTERECTOMY    .  APPENDECTOMY  1973  . BREAST EXCISIONAL BIOPSY Right   . CHOLECYSTECTOMY    . ERCP N/A 11/22/2017   Procedure: ENDOSCOPIC RETROGRADE CHOLANGIOPANCREATOGRAPHY (ERCP);  Surgeon: Lucilla Lame, MD;  Location: [facility] ENDOSCOPY;  Service: Endoscopy;  Laterality: N/A;  . ESOPHAGOGASTRODUODENOSCOPY  07-2006   with dilation  . [place]  . lumpectomy  [phone]   benign right breast  . SQUAMOUS CELL CARCINOMA EXCISION  6/12   Right leg--Dr Pat Patrick  . SQUAMOUS CELL CARCINOMA EXCISION  2012   right calf  . TOTAL ABDOMINAL HYSTERECTOMY W/ BILATERAL SALPINGOOPHORECTOMY  1973     Family History  Problem Relation Age of Onset  . Heart attack Father   . Stroke Mother   . Melanoma Daughter     Social History   Socioeconomic History  . Marital status: Married    Spouse name: Not on file  . Number of children: 2  . Years of education: Not on file  . Highest education level: Not on file  Occupational History  . Occupation: Lawyer  Social Needs  . Financial resource strain: Not on file  . Food insecurity    Worry: Not on file    Inability: Not on file  . Transportation needs    Medical: Not on file    Non-medical: Not on file  Tobacco Use  . Smoking status: Former Research scientist (life sciences)  . Smokeless tobacco: Never Used  Substance and Sexual Activity  . Alcohol use: Yes    Comment: occ  . Drug use: No  . Sexual activity: Not on file  Lifestyle  . Physical activity    Days per week: Not on file    Minutes per session: Not on file  . Stress: Not on file  Relationships  . Social Herbalist on phone: Not on file    Gets together: Not on file    Attends religious service: Not on file    Active member of club or organization: Not on file    Attends meetings of clubs or organizations: Not on file    Relationship status: Not on file  . Intimate partner violence    Fear of current or ex partner: Not on file    Emotionally abused: Not on file    Physically abused: Not on file    Forced sexual activity: Not on file  Other Topics Concern  . Not on file  Social History Narrative   Has living will   Husband is [place] health care POA---alternate is daughter Sula Soda   Would accept resuscitation but no prolonged ventilation   Probably would not want tube feedings if cognitively unaware   Review of Systems Appetite is good Weight stable Some sleep problems--initiates well, then up ~4-5AM. Goes to sleep 9:30. Discussed no need for meds for this Wears seat belt Bowels are fine--no blood No urinary symptoms. No incontinence No chest pain or SOB No  dizziness or syncope No edema    Objective:   Physical Exam  Constitutional: She is oriented to person, place, and time. She appears well-developed. No distress.  HENT:  Mouth/Throat: Oropharynx is clear and moist. No oropharyngeal exudate.  Neck: No thyromegaly present.  Cardiovascular: Normal rate, regular rhythm, normal heart sounds and intact distal pulses. Exam reveals no gallop.  No murmur heard. Respiratory: Effort normal and breath sounds normal. No respiratory distress. She has no wheezes. She has no rales.  GI: Soft. There is no abdominal tenderness.  Musculoskeletal:  General: No tenderness or edema.  Lymphadenopathy:    She has no cervical adenopathy.  Neurological: She is alert and oriented to person, place, and time.  President--- "Dwaine Deter, young Bush" [id number] Recall 3/3  Skin:  Scattered small papules  Psychiatric: She has a normal mood and affect. Her behavior is normal.           Assessment & Plan:

## 2019-05-06 NOTE — Assessment & Plan Note (Signed)
BP Readings from Last 3 Encounters:  05/06/19 124/78  01/19/19 104/60  07/01/18 (!) 146/78   Good control Due for labs

## 2019-05-07 NOTE — Telephone Encounter (Signed)
Last filled 03-02-19 #60 Last OV 05-05-19 Next OV 05-09-20 [facility]

## 2019-05-20 DIAGNOSIS — H2513 Age-related nuclear cataract, bilateral: Secondary | ICD-10-CM | POA: Diagnosis not present

## 2019-05-21 DIAGNOSIS — H5711 Ocular pain, right eye: Secondary | ICD-10-CM | POA: Diagnosis not present

## 2019-05-21 DIAGNOSIS — G43109 Migraine with aura, not intractable, without status migrainosus: Secondary | ICD-10-CM | POA: Diagnosis not present

## 2019-06-09 ENCOUNTER — Other Ambulatory Visit: Payer: Self-pay | Admitting: Internal Medicine

## 2019-07-03 ENCOUNTER — Telehealth: Payer: Self-pay

## 2019-07-03 NOTE — Telephone Encounter (Signed)
Please check on her on Monday 

## 2019-07-03 NOTE — Telephone Encounter (Signed)
Pt calling; not feeling well, pt has had 2 episodes today of weakness in her upper chest; no real chest pain but pt said could be anxiety; pt has had heart palpitations twice today.pt feels like she may faint. Pt took BP earlier 120/70 something. Pt is in car now close to office; no available appt and pts husband will take pt to [facility] in [place] now. FYI to Dr Silvio Pate.

## 2019-07-06 ENCOUNTER — Ambulatory Visit (INDEPENDENT_AMBULATORY_CARE_PROVIDER_SITE_OTHER): Payer: Medicare Other | Admitting: Internal Medicine

## 2019-07-06 ENCOUNTER — Other Ambulatory Visit: Payer: Self-pay

## 2019-07-06 ENCOUNTER — Encounter: Payer: Self-pay | Admitting: Internal Medicine

## 2019-07-06 VITALS — BP 130/80 | HR 79 | Temp 97.8°F | Ht 64.0 in | Wt 154.0 lb

## 2019-07-06 DIAGNOSIS — R0602 Shortness of breath: Secondary | ICD-10-CM | POA: Diagnosis not present

## 2019-07-06 DIAGNOSIS — R0789 Other chest pain: Secondary | ICD-10-CM | POA: Diagnosis not present

## 2019-07-06 NOTE — Progress Notes (Signed)
Subjective:    Patient ID: Dominique Hall, female    DOB: 1935/05/28, 84 y.o.   MRN: [id number]  HPI Here due to dizziness and some chest pressure  2 days ago, had 3 episodes of lightheadedness and upper chest pressure/tightness Did alarm her--but very short lasting (maybe 30 seconds) Slightly SOB since then--like getting up after eating (has to sit down to rest briefly) Husband thinks she is retaining water---but he goes more due to diuretic (and there is no edema, but some abdominal distention--though no change)  Current Outpatient Medications on File Prior to Visit  Medication Sig Dispense Refill   clobetasol ointment (TEMOVATE) 0.05 % APPLY TO VULVAR AREA DAILY AT BED X4 WEEKS, EVERY OTHER DAY X2 WEEKS TWICE WEEKLY X2 WEEKS THEN STOP 30 g 3   clorazepate (TRANXENE) 7.5 MG tablet TAKE 1 TO 2 TABLETS BY MOUTH AT BEDTIME AS NEEDED 60 tablet 0   estradiol (ESTRACE) 0.1 MG/GM vaginal cream Place 1 Applicatorful vaginally 2 (two) times a week.      hydrochlorothiazide (MICROZIDE) 12.5 MG capsule TAKE 1 CAPSULE BY MOUTH EVERY DAY IN THE MORNING 90 capsule 2   meloxicam (MOBIC) 7.5 MG tablet Take 1 tablet (7.5 mg total) by mouth daily. 90 tablet 0   Multiple Vitamin (MULTIVITAMIN) tablet Take 1 tablet by mouth daily.     NEXIUM 40 MG capsule Take 1 capsule (40 mg total) by mouth daily. 90 capsule 3   Omega-3 Fatty Acids (FISH OIL PO) Take 1 capsule by mouth daily.     triamcinolone cream (KENALOG) 0.1 % Apply 1 application topically 2 (two) times daily. To itchy/rash areas 30 g 0   vitamin C (ASCORBIC ACID) 500 MG tablet Take 500 mg by mouth daily.     No current facility-administered medications on file prior to visit.     Allergies  Allergen Reactions   Penicillins     Has patient had a PCN reaction causing immediate rash, facial/tongue/throat swelling, SOB or lightheadedness with hypotension: Unknown Has patient had a PCN reaction causing severe rash involving mucus  membranes or skin necrosis: Unknown Has patient had a PCN reaction that required hospitalization: Unknown Has patient had a PCN reaction occurring within the last 10 years: Unknown If all of the above answers are "NO", then may proceed with Cephalosporin use.   Rabeprazole Sodium     REACTION: constipated   Sulfonamide Derivatives     REACTION: unspecified   Cefuroxime     diarrhea    Past Medical History:  Diagnosis Date   Allergy    Arthritis    Diverticulosis    Esophageal dysmotility    Esophageal ring    GERD (gastroesophageal reflux disease)    Hiatal hernia    HTN (hypertension)    IBS (irritable bowel syndrome)    Osteopenia    Renal disorder    Sleep disturbance     Past Surgical History:  Procedure Laterality Date   ABDOMINAL HYSTERECTOMY     APPENDECTOMY  1973   BREAST EXCISIONAL BIOPSY Right    CHOLECYSTECTOMY     ERCP N/A 11/22/2017   Procedure: ENDOSCOPIC RETROGRADE CHOLANGIOPANCREATOGRAPHY (ERCP);  Surgeon: Lucilla Lame, MD;  Location: [facility] ENDOSCOPY;  Service: Endoscopy;  Laterality: N/A;   ESOPHAGOGASTRODUODENOSCOPY  07-2006   with dilation   KIDNEY SURGERY  1970   lumpectomy  [phone]   benign right breast   SQUAMOUS CELL CARCINOMA EXCISION  6/12   Right leg--Dr Pat Patrick   SQUAMOUS CELL CARCINOMA EXCISION  2012   right calf   TOTAL ABDOMINAL HYSTERECTOMY W/ BILATERAL SALPINGOOPHORECTOMY  1973    Family History  Problem Relation Age of Onset   Heart attack Father    Stroke Mother    Melanoma Daughter     Social History   Socioeconomic History   Marital status: Married    Spouse name: Not on file   Number of children: 2   Years of education: Not on file   Highest education level: Not on file  Occupational History   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  Social Needs   Financial resource strain: Not on file   Food insecurity    Worry: Not on file    Inability: Not on file   Transportation needs    Medical: Not on  file    Non-medical: Not on file  Tobacco Use   Smoking status: Former Smoker   Smokeless tobacco: Never Used  Substance and Sexual Activity   Alcohol use: Yes    Comment: occ   Drug use: No   Sexual activity: Not on file  Lifestyle   Physical activity    Days per week: Not on file    Minutes per session: Not on file   Stress: Not on file  Relationships   Social connections    Talks on phone: Not on file    Gets together: Not on file    Attends religious service: Not on file    Active member of club or organization: Not on file    Attends meetings of clubs or organizations: Not on file    Relationship status: Not on file   Intimate partner violence    Fear of current or ex partner: Not on file    Emotionally abused: Not on file    Physically abused: Not on file    Forced sexual activity: Not on file  Other Topics Concern   Not on file  Social History Narrative   Has living will   Husband is [place] health care POA---alternate is daughter Sula Soda   Would accept resuscitation but no prolonged ventilation   Probably would not want tube feedings if cognitively unaware   Review of Systems Still independent with activities--no clear change in stamina lately Appetite is fine Sleeping well Constipated over the past month---- discussed trying miralax daily    Objective:   Physical Exam  Constitutional: She appears well-developed. No distress.  Neck: No thyromegaly present.  Cardiovascular: Normal rate, regular rhythm, normal heart sounds and intact distal pulses. Exam reveals no gallop.  No murmur heard. Respiratory: Effort normal and breath sounds normal. No respiratory distress. She has no wheezes. She has no rales.  GI: Soft. There is no abdominal tenderness.  Musculoskeletal:        General: No edema.  Lymphadenopathy:    She has no cervical adenopathy.  Psychiatric: She has a normal mood and affect. Her behavior is normal.           Assessment & Plan:

## 2019-07-06 NOTE — Assessment & Plan Note (Addendum)
Vague and brief but still concerning for unstable angina Some SOB and dizziness as well EKG shows sinus, normal axis and intervals. No ischemic changes. Since 11/22/17, PVCs not seen  Will proceed with cardiology evaluation due to my concern 911 if persistent symptoms in the meantime

## 2019-07-06 NOTE — Telephone Encounter (Signed)
Pt has an appt this afternoon with [place].

## 2019-07-07 ENCOUNTER — Telehealth: Payer: Self-pay

## 2019-07-07 ENCOUNTER — Telehealth: Payer: Self-pay | Admitting: *Deleted

## 2019-07-07 ENCOUNTER — Encounter: Payer: Self-pay | Admitting: Cardiovascular Disease

## 2019-07-07 ENCOUNTER — Telehealth (INDEPENDENT_AMBULATORY_CARE_PROVIDER_SITE_OTHER): Payer: Medicare Other | Admitting: Cardiovascular Disease

## 2019-07-07 VITALS — BP 132/84 | HR 79 | Ht 64.0 in | Wt 154.0 lb

## 2019-07-07 DIAGNOSIS — I10 Essential (primary) hypertension: Secondary | ICD-10-CM

## 2019-07-07 DIAGNOSIS — R079 Chest pain, unspecified: Secondary | ICD-10-CM

## 2019-07-07 DIAGNOSIS — R0602 Shortness of breath: Secondary | ICD-10-CM

## 2019-07-07 NOTE — Telephone Encounter (Signed)
Spoke with patient and she had a virtual visit today with Dr. Fletcher Anon. She said her husband was a pt of Dr. Rockey Situ and she wanted to see him but was told that she could not switch. Advised that if she is happy with Dr. Fletcher Anon then he is a great provider and that I trust him completely just like Dr. Rockey Situ but if she desires that at some point then we could switch it if needed. She was appreciative for the follow up with no further questions at this time.

## 2019-07-07 NOTE — Patient Instructions (Addendum)
Medication Instructions:  Your physician recommends that you continue on your current medications as directed. Please refer to the Current Medication list given to you today.  If you need a refill on your cardiac medications before your next appointment, please call your pharmacy.   Lab work: None ordered If you have labs (blood work) drawn today and your tests are completely normal, you will receive your results only by: Marland Kitchen MyChart Message (if you have MyChart) OR . A paper copy in the mail If you have any lab test that is abnormal or we need to change your treatment, we will call you to review the results.  Testing/Procedures: Your physician has requested that you have an echocardiogram. Echocardiography is a painless test that uses sound waves to create images of your heart. It provides your doctor with information about the size and shape of your heart and how well your heart's chambers and valves are working. This procedure takes approximately one hour. There are no restrictions for this procedure.  Your physician has requested that you have a lexiscan myoview. For further information please visit [email or web address]. Please follow instruction sheet, as given.    Follow-Up: At [facility], you and your health needs are our priority.  As part of our continuing mission to provide you with exceptional heart care, we have created designated Provider Care Teams.  These Care Teams include your primary Cardiologist (physician) and Advanced Practice Providers (APPs -  Physician Assistants and Nurse Practitioners) who all work together to provide you with the care you need, when you need it. You will need a follow up appointment as needed You may see  Dr. Fletcher Anon or one of the following Advanced Practice Providers on your designated Care Team:   Murray Hodgkins, NP Christell Faith, PA-C . Marrianne Mood, PA-C  Any Other Special Instructions Will Be Listed Below (If Applicable). [place]  Your caregiver has ordered a Stress Test with nuclear imaging. The purpose of this test is to evaluate the blood supply to your heart muscle. This procedure is referred to as a "Non-Invasive Stress Test." This is because other than having an IV started in your vein, nothing is inserted or "invades" your body. Cardiac stress tests are done to find areas of poor blood flow to the heart by determining the extent of coronary artery disease (CAD). Some patients exercise on a treadmill, which naturally increases the blood flow to your heart, while others who are  unable to walk on a treadmill due to physical limitations have a pharmacologic/chemical stress agent called Lexiscan . This medicine will mimic walking on a treadmill by temporarily increasing your coronary blood flow.   Please note: these test may take anywhere between 2-4 hours to complete  PLEASE REPORT TO [place] AT THE FIRST DESK WILL DIRECT YOU WHERE TO GO  Date of Procedure:_____________________________________  Arrival Time for Procedure:______________________________  Instructions regarding medication:    OK to take your morning medications with a sip of water  PLEASE NOTIFY THE OFFICE AT LEAST 24 HOURS IN ADVANCE IF YOU ARE UNABLE TO [place].  [phone] AND  PLEASE NOTIFY NUCLEAR MEDICINE AT [facility] AT LEAST 24 HOURS IN ADVANCE IF YOU ARE UNABLE TO KEEP YOUR APPOINTMENT. [phone]  How to prepare for your Myoview test:  1. Do not eat or drink after midnight 2. No caffeine for 24 hours prior to test 3. No smoking 24 hours prior to test. 4. Your medication may be  taken with water.  If your doctor stopped a medication because of this test, do not take that medication. 5. Ladies, please do not wear dresses.  Skirts or pants are appropriate. Please wear a short sleeve shirt. 6. No perfume, cologne or lotion. 7. Wear comfortable walking shoes. No  heels!

## 2019-07-07 NOTE — Telephone Encounter (Signed)
Contacted the pt to f/u after his 07/07/19 telehealth visit with Dr. Fletcher Anon. Patient given pre-test instructions for her lexiscan and echo. Adv the patient that a scheduler will contact her to schedule. Patient verbalized understanding and voiced appreciation for the call.

## 2019-07-07 NOTE — Progress Notes (Signed)
Pam, Can we call her, I am in hospital rest of the week Perhaps early next week with me or another provider this week? See which she prefers thx TG

## 2019-07-07 NOTE — Progress Notes (Signed)
Virtual Visit via Telephone Note   This visit type was conducted due to national recommendations for restrictions regarding the COVID-19 Pandemic (e.g. social distancing) in an effort to limit this patient's exposure and mitigate transmission in our community.  Due to her co-morbid illnesses, this patient is at least at moderate risk for complications without adequate follow up.  This format is felt to be most appropriate for this patient at this time.  The patient did not have access to video technology/had technical difficulties with video requiring transitioning to audio format only (telephone).  All issues noted in this document were discussed and addressed.  No physical exam could be performed with this format.  Please refer to the patient's chart for her  consent to telehealth for [facility].   Date:  07/07/2019   ID:  Dominique Hall, DOB [birth year], MRN [id number]  Patient Location: Home Provider Location: Office  PCP:  Venia Carbon, MD  Cardiologist:  No primary care provider on file.  Electrophysiologist:  None   Evaluation Performed:  Consultation - Sherle Poe was referred by Dr. Silvio Pate for the evaluation of chest pain.  Chief Complaint: Chest pain  History of Present Illness:    Dominique Hall is a 84 y.o. female who was reached via phone for consultation regarding chest pain.  I attempted to connect via video but the patient's phone camera was not working. She reports previous history of palpitations and tachycardia in 2005 and was hospitalized at [facility] with negative work-up.  The records are not available.  She has been relatively healthy throughout her life with only hypertension.  She is not diabetic and not a smoker. She had recent episodes of chest pain described as tightness feeling in the substernal area close to the jaw with associated lightheadedness.  Each episode lasted for about 1 minute and was not exertional.  She did experience worsening  heartburn recently.  In addition, she noticed some shortness of breath.  No palpitations, or syncope.  No recent orthopnea, PND or leg edema.  She has not had any recent cardiac evaluation. Her EKG yesterday was normal.  The patient does not have symptoms concerning for COVID-19 infection (fever, chills, cough, or new shortness of breath).    Past Medical History:  Diagnosis Date  . Allergy   . Arthritis   . Diverticulosis   . Esophageal dysmotility   . Esophageal ring   . GERD (gastroesophageal reflux disease)   . Hiatal hernia   . HTN (hypertension)   . IBS (irritable bowel syndrome)   . Osteopenia   . Renal disorder   . Sleep disturbance    Past Surgical History:  Procedure Laterality Date  . ABDOMINAL HYSTERECTOMY    . APPENDECTOMY  1973  . BREAST EXCISIONAL BIOPSY Right   . CHOLECYSTECTOMY    . ERCP N/A 11/22/2017   Procedure: ENDOSCOPIC RETROGRADE CHOLANGIOPANCREATOGRAPHY (ERCP);  Surgeon: Lucilla Lame, MD;  Location: [facility] ENDOSCOPY;  Service: Endoscopy;  Laterality: N/A;  . ESOPHAGOGASTRODUODENOSCOPY  07-2006   with dilation  . [place]  . lumpectomy  [phone]   benign right breast  . SQUAMOUS CELL CARCINOMA EXCISION  6/12   Right leg--Dr Pat Patrick  . SQUAMOUS CELL CARCINOMA EXCISION  2012   right calf  . TOTAL ABDOMINAL HYSTERECTOMY W/ BILATERAL SALPINGOOPHORECTOMY  1973     Current Meds  Medication Sig  . clobetasol ointment (TEMOVATE) 0.05 % APPLY TO VULVAR AREA DAILY AT BED X4 WEEKS, EVERY OTHER  DAY X2 WEEKS TWICE WEEKLY X2 WEEKS THEN STOP  . clorazepate (TRANXENE) 7.5 MG tablet TAKE 1 TO 2 TABLETS BY MOUTH AT BEDTIME AS NEEDED  . hydrochlorothiazide (MICROZIDE) 12.5 MG capsule TAKE 1 CAPSULE BY MOUTH EVERY DAY IN THE MORNING  . meloxicam (MOBIC) 7.5 MG tablet Take 1 tablet (7.5 mg total) by mouth daily.  . Multiple Vitamin (MULTIVITAMIN) tablet Take 1 tablet by mouth daily.  Marland Kitchen NEXIUM 40 MG capsule Take 1 capsule (40 mg total) by mouth daily.  . NON  FORMULARY Immunity support gummies Takes 1 gummy daily.  . NON FORMULARY Black elderberry gummy Takes 1 gummy qd.  . Omega-3 Fatty Acids (FISH OIL PO) Take 1 capsule by mouth daily.  Marland Kitchen triamcinolone cream (KENALOG) 0.1 % Apply 1 application topically 2 (two) times daily. To itchy/rash areas  . vitamin C (ASCORBIC ACID) 500 MG tablet Take 500 mg by mouth daily.     Allergies:   Penicillins, Rabeprazole sodium, Sulfonamide derivatives, and Cefuroxime   Social History   Tobacco Use  . Smoking status: Former Research scientist (life sciences)  . Smokeless tobacco: Never Used  Substance Use Topics  . Alcohol use: Yes    Comment: occ  . Drug use: No     Family Hx: The patient's family history includes Heart attack in her father; Melanoma in her daughter; Stroke in her mother.  ROS:   Please see the history of present illness.     All other systems reviewed and are negative.   Prior CV studies:   The following studies were reviewed today:  No recent cardiac studies.  Labs/Other Tests and Data Reviewed:    EKG:  An ECG dated 04/05/2019 was personally reviewed today and demonstrated:  Normal sinus rhythm with no significant ST or T wave changes.  Recent Labs: 05/06/2019: ALT 13; BUN 22; Creatinine, Ser 0.71; Hemoglobin 12.9; Platelets 167.0; Potassium 4.2; Sodium 143   Recent Lipid Panel Lab Results  Component Value Date/Time   CHOL 152 02/22/2014 04:12 PM   TRIG 98.0 02/22/2014 04:12 PM   HDL 63.00 02/22/2014 04:12 PM   CHOLHDL 2 02/22/2014 04:12 PM   LDLCALC 69 02/22/2014 04:12 PM    Wt Readings from Last 3 Encounters:  07/07/19 154 lb (69.9 kg)  07/06/19 154 lb (69.9 kg)  05/06/19 154 lb (69.9 kg)     Objective:    Vital Signs:  BP 132/84 (BP Location: Left Arm, Patient Position: Sitting, Cuff Size: Normal)   Pulse 79   Ht 5\' 4"  (1.626 m)   Wt 154 lb (69.9 kg)   BMI 26.43 kg/m    VITAL SIGNS:  reviewed  ASSESSMENT & PLAN:    1. Chest pain seems to be overall atypical but has been  recurrent and could represent new angina.  Fortunately, her baseline EKG from yesterday did not show any ischemic changes.  I recommend urgent evaluation with a Lexiscan Myoview.  She is not able to exercise on a treadmill.  Given associated dizziness and shortness of breath, I am also going to obtain an echocardiogram.  2.  Essential hypertension: Blood pressure seems to be well controlled.  COVID-19 Education: The signs and symptoms of COVID-19 were discussed with the patient and how to seek care for testing (follow up with PCP or arrange E-visit).  The importance of social distancing was discussed today.  Time:   Today, I have spent 12 minutes with the patient with telehealth technology discussing the above problems.     Medication Adjustments/Labs  and Tests Ordered: Current medicines are reviewed at length with the patient today.  Concerns regarding medicines are outlined above.   Tests Ordered: No orders of the defined types were placed in this encounter.   Medication Changes: No orders of the defined types were placed in this encounter.   Follow Up: as needed based on work up  Signed, Kathlyn Sacramento, MD  07/07/2019 4:17 PM    [place]

## 2019-07-07 NOTE — Telephone Encounter (Signed)
-----   Message from Minna Merritts, MD sent at 07/07/2019  3:19 PM EDT -----   ----- Message ----- From: Venia Carbon, MD Sent: 07/06/2019   3:07 PM EDT To: Minna Merritts, MD  Tim,Can you get her in this week (your husband sweats by you). Not a clear history and normal EKG, but I am concerned about possible ischemic etiology of her symptomsRich

## 2019-07-14 ENCOUNTER — Telehealth: Payer: Self-pay | Admitting: Physician Assistant

## 2019-07-14 NOTE — Telephone Encounter (Signed)
Pt called because she was concerned.  She had 2 small servings of caffeinated beverages, not realizing she was supposed to avoid caffeine for 24 hr before her stress test. Stress test is tomorrow.  Advised her not to have any more caffeine.  Follow all other instructions.  Tomorrow, call over to the stress lab before she goes in, to see if it is still ok to have the stress test. If not, reschedule.  Rosaria Ferries, PA-C 07/14/2019 7:23 PM Bee[place] [phone]

## 2019-07-15 ENCOUNTER — Encounter
Admission: RE | Admit: 2019-07-15 | Discharge: 2019-07-15 | Disposition: A | Discharge status: Home / Self Care | Payer: Medicare Other | Source: Ambulatory Visit | Attending: Cardiovascular Disease | Admitting: Cardiovascular Disease

## 2019-07-15 ENCOUNTER — Other Ambulatory Visit: Payer: Self-pay

## 2019-07-15 DIAGNOSIS — R0602 Shortness of breath: Secondary | ICD-10-CM | POA: Diagnosis not present

## 2019-07-15 DIAGNOSIS — R079 Chest pain, unspecified: Secondary | ICD-10-CM | POA: Insufficient documentation

## 2019-07-15 LAB — NM MYOCAR MULTI W/SPECT W/WALL MOTION / EF
Estimated workload: 1 METS
Exercise duration (min): 0 min
Exercise duration (sec): 0 s
LV dias vol: 50 mL (ref 46–106)
LV sys vol: 20 mL
MPHR: 138 {beats}/min
Peak HR: 96 {beats}/min
Percent HR: 69 %
Rest HR: 84 {beats}/min
SDS: 0
SRS: 5
SSS: 0
TID: 1.38

## 2019-07-15 MED ORDER — REGADENOSON 0.4 MG/5ML IV SOLN
0.4000 mg | Freq: Once | INTRAVENOUS | Status: AC
  Administered 2019-07-15: 0.4 mg via INTRAVENOUS

## 2019-07-15 MED ORDER — TECHNETIUM TC 99M TETROFOSMIN IV KIT
10.0000 | PACK | Freq: Once | INTRAVENOUS | Status: AC | PRN
  Administered 2019-07-15: 09:00:00 10.84 via INTRAVENOUS

## 2019-07-15 MED ORDER — TECHNETIUM TC 99M TETROFOSMIN IV KIT
32.3260 | PACK | Freq: Once | INTRAVENOUS | Status: AC | PRN
  Administered 2019-07-15: 11:00:00 32.326 via INTRAVENOUS

## 2019-07-17 ENCOUNTER — Telehealth: Payer: Self-pay

## 2019-07-17 NOTE — Telephone Encounter (Signed)
Patient made aware of stress test results with verbalized understanding. Patient voiced appreciation for the call.

## 2019-07-17 NOTE — Telephone Encounter (Signed)
-----   Message from Wellington Hampshire, MD sent at 07/17/2019 10:44 AM EDT ----- Inform patient that  stress test was normal.  EF could not be determined due to artifact but we should be able to evaluate that with the scheduled echocardiogram.

## 2019-07-30 ENCOUNTER — Other Ambulatory Visit: Payer: Self-pay

## 2019-07-30 ENCOUNTER — Ambulatory Visit (INDEPENDENT_AMBULATORY_CARE_PROVIDER_SITE_OTHER): Payer: Medicare Other

## 2019-07-30 DIAGNOSIS — R0602 Shortness of breath: Secondary | ICD-10-CM

## 2019-08-03 ENCOUNTER — Telehealth: Payer: Self-pay

## 2019-08-03 NOTE — Telephone Encounter (Signed)
Called to give the patient echo results. [email or web address]. 

## 2019-08-03 NOTE — Telephone Encounter (Signed)
-----   Message from Wellington Hampshire, MD sent at 08/03/2019  3:54 PM EDT ----- Inform patient that echo was fine.  Normal ejection fraction with no significant valvular abnormalities.

## 2019-08-04 NOTE — Progress Notes (Signed)
Virtual Visit via Video Note   This visit type was conducted due to national recommendations for restrictions regarding the COVID-19 Pandemic (e.g. social distancing) in an effort to limit this patient's exposure and mitigate transmission in our community.  Due to her co-morbid illnesses, this patient is at least at moderate risk for complications without adequate follow up.  This format is felt to be most appropriate for this patient at this time.  All issues noted in this document were discussed and addressed.  A limited physical exam was performed with this format.  Please refer to the patient's chart for her consent to telehealth for [facility].   I connected with  Dominique Hall on 08/05/19 by a video enabled telemedicine application and verified that I am speaking with the correct person using two identifiers. I discussed the limitations of evaluation and management by telemedicine. The patient expressed understanding and agreed to proceed.   Evaluation Performed:  Follow-up visit  Date:  08/05/2019   ID:  Dominique Hall, DOB [birth year], MRN [id number]  Patient Location:  [place] [address]   Provider location:   [facility], [place] office  PCP:  Venia Carbon, MD  Cardiologist:  Patsy Baltimore   Chief Complaint: Prior episode of chest pain    History of Present Illness:    Dominique Hall is a 84 y.o. female who presents via audio/video conferencing for a telehealth visit today.   The patient does not symptoms concerning for COVID-19 infection (fever, chills, cough, or new SHORTNESS OF BREATH).   Patient has a past medical history of  chest pain.  palpitations  not diabetic and not a smoker. tachycardia in 2005 ,hospitalized at [facility] with negative work-up.   Who presents for routine follow-up of her chest pain episodes  Walks daily, no chest pain 1/4 mile twice a day Normal walking, no sob With heavy exertion, some sob   Previously with chest tightness, at rest feels it is Nervous/anxiety, takes tranxene, helps sx Hyper at baseline  She had echocardiogram and stress test No ischemia, normal ejection fraction Results reviewed with her in detail  EKG essentially benign  Prior CV studies:   The following studies were reviewed today:  Stress test 07/2019 Pharmacological myocardial perfusion imaging study with no significant  ischemia Grossly Normal wall motion, Unable to estimate EF secondary to GI uptake artifact No EKG changes concerning for ischemia at peak stress or in recovery. Low risk scan   Echo  07/2019  1. Left ventricular ejection fraction, by visual estimation, is 60 to 65%. The left ventricle has normal function. Normal left ventricular size. There is no left ventricular hypertrophy.  2. Left ventricular diastolic Doppler parameters are consistent with impaired relaxation pattern of LV diastolic filling.  3. Global right ventricle has normal systolic function.The right ventricular size is normal. No increase in right ventricular wall thickness.  4. Mild to moderately elevated pulmonary artery systolic pressure 42 mm Hg.  5. Left atrial size was mildly dilated.  6. Right atrial size was normal.    Past Medical History:  Diagnosis Date  . Allergy   . Arthritis   . Diverticulosis   . Esophageal dysmotility   . Esophageal ring   . GERD (gastroesophageal reflux disease)   . Hiatal hernia   . HTN (hypertension)   . IBS (irritable bowel syndrome)   . Osteopenia   . Renal disorder   . Sleep disturbance  Past Surgical History:  Procedure Laterality Date  . ABDOMINAL HYSTERECTOMY    . APPENDECTOMY  1973  . BREAST EXCISIONAL BIOPSY Right   . CHOLECYSTECTOMY    . ERCP N/A 11/22/2017   Procedure: ENDOSCOPIC RETROGRADE CHOLANGIOPANCREATOGRAPHY (ERCP);  Surgeon: Lucilla Lame, MD;  Location: [facility] ENDOSCOPY;  Service: Endoscopy;  Laterality: N/A;  . ESOPHAGOGASTRODUODENOSCOPY  07-2006    with dilation  . [place]  . lumpectomy  [phone]   benign right breast  . SQUAMOUS CELL CARCINOMA EXCISION  6/12   Right leg--Dr Pat Patrick  . SQUAMOUS CELL CARCINOMA EXCISION  2012   right calf  . TOTAL ABDOMINAL HYSTERECTOMY W/ BILATERAL SALPINGOOPHORECTOMY  1973     Allergies:   Penicillins, Rabeprazole sodium, Sulfonamide derivatives, and Cefuroxime   Social History   Tobacco Use  . Smoking status: Former Research scientist (life sciences)  . Smokeless tobacco: Never Used  Substance Use Topics  . Alcohol use: Yes    Comment: occ  . Drug use: No     Current Outpatient Medications on File Prior to Visit  Medication Sig Dispense Refill  . clorazepate (TRANXENE) 7.5 MG tablet TAKE 1 TO 2 TABLETS BY MOUTH AT BEDTIME AS NEEDED 60 tablet 0  . hydrochlorothiazide (MICROZIDE) 12.5 MG capsule TAKE 1 CAPSULE BY MOUTH EVERY DAY IN THE MORNING 90 capsule 2  . meloxicam (MOBIC) 7.5 MG tablet Take 1 tablet (7.5 mg total) by mouth daily. 90 tablet 0  . Multiple Vitamin (MULTIVITAMIN) tablet Take 1 tablet by mouth daily.    Marland Kitchen NEXIUM 40 MG capsule Take 1 capsule (40 mg total) by mouth daily. 90 capsule 3  . NON FORMULARY Immunity support gummies Takes 1 gummy daily.    . NON FORMULARY Black elderberry gummy Takes 1 gummy qd.    . Omega-3 Fatty Acids (FISH OIL PO) Take 1 capsule by mouth daily.    Marland Kitchen triamcinolone cream (KENALOG) 0.1 % Apply 1 application topically 2 (two) times daily. To itchy/rash areas 30 g 0  . vitamin C (ASCORBIC ACID) 500 MG tablet Take 500 mg by mouth daily.     No current facility-administered medications on file prior to visit.      Family Hx: The patient's family history includes Heart attack in her father; Melanoma in her daughter; Stroke in her mother.  ROS:   Please see the history of present illness.    Review of Systems  Constitutional: Negative.   HENT: Negative.   Respiratory: Negative.   Cardiovascular: Negative.   Gastrointestinal: Negative.   Musculoskeletal: Negative.    Neurological: Negative.   Psychiatric/Behavioral: The patient is nervous/anxious.   All other systems reviewed and are negative.    Labs/Other Tests and Data Reviewed:    Recent Labs: 05/06/2019: ALT 13; BUN 22; Creatinine, Ser 0.71; Hemoglobin 12.9; Platelets 167.0; Potassium 4.2; Sodium 143   Recent Lipid Panel Lab Results  Component Value Date/Time   CHOL 152 02/22/2014 04:12 PM   TRIG 98.0 02/22/2014 04:12 PM   HDL 63.00 02/22/2014 04:12 PM   CHOLHDL 2 02/22/2014 04:12 PM   LDLCALC 69 02/22/2014 04:12 PM    Wt Readings from Last 3 Encounters:  08/05/19 150 lb (68 kg)  07/07/19 154 lb (69.9 kg)  07/06/19 154 lb (69.9 kg)    Exam:    Vital Signs: Vital signs may also be detailed in the HPI BP 118/78   Pulse 84   Ht 5\' 4"  (1.626 m)   Wt 150 lb (68 kg)   BMI  25.75 kg/m   Wt Readings from Last 3 Encounters:  08/05/19 150 lb (68 kg)  07/07/19 154 lb (69.9 kg)  07/06/19 154 lb (69.9 kg)   Temp Readings from Last 3 Encounters:  07/06/19 97.8 F (36.6 C)  05/06/19 97.8 F (36.6 C) (Oral)  01/19/19 97.6 F (36.4 C) (Oral)   BP Readings from Last 3 Encounters:  08/05/19 118/78  07/07/19 132/84  07/06/19 130/80   Pulse Readings from Last 3 Encounters:  08/05/19 84  07/07/19 79  07/06/19 79     Well nourished, well developed female in no acute distress. Constitutional:  oriented to person, place, and time. No distress.    ASSESSMENT & PLAN:    Problem List Items Addressed This Visit    None    Visit Diagnoses    Chest pain, unspecified type    -  Primary   SOB (shortness of breath)       Essential hypertension         Chest pain: Atypical, presenting at rest associated with anxiety Symptoms at rest Testing reviewed with her including echocardiogram and stress test No further testing at this time No symptoms on exertion  Shortness of breath Noticing mild symptoms on heavier exertion, Nothing on general walking around Discussed echocardiogram  finding, mildly elevated right heart pressures Denies leg swelling, abdominal bloating We will continue HCTZ at this time For any worsening shortness of breath, leg swelling abdominal bloating could consider adding Lasix as needed  COVID-19 Education: The signs and symptoms of COVID-19 were discussed with the patient and how to seek care for testing (follow up with PCP or arrange E-visit).  The importance of social distancing was discussed today.  Patient Risk:   After full review of this patients clinical status, I feel that they are at least moderate risk at this time.  Time:   Today, I have spent 25 minutes with the patient with telehealth technology discussing the cardiac and medical problems/diagnoses detailed above   Additional 10 min spent reviewing the chart prior to patient visit today   Medication Adjustments/Labs and Tests Ordered: Current medicines are reviewed at length with the patient today.  Concerns regarding medicines are outlined above.   Tests Ordered: No tests ordered   Medication Changes: No changes made   Disposition: Follow-up in 12 months   Signed, Ida Rogue, MD  [place] Office [address] [place] [address], [place], [place] [address]

## 2019-08-05 ENCOUNTER — Encounter: Payer: Self-pay | Admitting: Cardiovascular Disease

## 2019-08-05 ENCOUNTER — Telehealth: Payer: Medicare Other | Admitting: Cardiovascular Disease

## 2019-08-05 ENCOUNTER — Telehealth (INDEPENDENT_AMBULATORY_CARE_PROVIDER_SITE_OTHER): Payer: Medicare Other | Admitting: Cardiovascular Disease

## 2019-08-05 VITALS — BP 118/78 | HR 84 | Ht 64.0 in | Wt 150.0 lb

## 2019-08-05 DIAGNOSIS — R079 Chest pain, unspecified: Secondary | ICD-10-CM | POA: Diagnosis not present

## 2019-08-05 DIAGNOSIS — I10 Essential (primary) hypertension: Secondary | ICD-10-CM

## 2019-08-05 DIAGNOSIS — R0602 Shortness of breath: Secondary | ICD-10-CM

## 2019-08-05 DIAGNOSIS — F419 Anxiety disorder, unspecified: Secondary | ICD-10-CM

## 2019-08-05 NOTE — Patient Instructions (Signed)

## 2019-08-13 ENCOUNTER — Other Ambulatory Visit: Payer: Self-pay | Admitting: Internal Medicine

## 2019-08-13 DIAGNOSIS — G4700 Insomnia, unspecified: Secondary | ICD-10-CM

## 2019-08-13 NOTE — Telephone Encounter (Signed)
Last filled 05-07-19 #60 Last OV 07-06-19 Next OV 05-09-20 [facility]

## 2019-08-18 ENCOUNTER — Ambulatory Visit (INDEPENDENT_AMBULATORY_CARE_PROVIDER_SITE_OTHER): Payer: Medicare Other

## 2019-08-18 DIAGNOSIS — Z23 Encounter for immunization: Secondary | ICD-10-CM

## 2019-11-21 ENCOUNTER — Other Ambulatory Visit: Payer: Self-pay | Admitting: Internal Medicine

## 2019-11-21 DIAGNOSIS — G4700 Insomnia, unspecified: Secondary | ICD-10-CM

## 2019-11-23 NOTE — Telephone Encounter (Signed)
Last filled 08-13-19 #60 Last OV 07-06-19 Next OV 05-09-20 [facility]

## 2020-01-11 ENCOUNTER — Other Ambulatory Visit: Payer: Self-pay | Admitting: Internal Medicine

## 2020-01-11 DIAGNOSIS — G4700 Insomnia, unspecified: Secondary | ICD-10-CM

## 2020-01-11 NOTE — Telephone Encounter (Signed)
Last filled 11-23-19 #60 Last OV 07-06-19 Next OV 05-09-20 [facility]

## 2020-01-13 ENCOUNTER — Telehealth: Payer: Self-pay

## 2020-01-13 NOTE — Telephone Encounter (Signed)
Received call from [place], that reported pt c/o erratic HTN and weakness. Debbie's assessment recommended pt go to ER but pt said she did not want to go due to long wait the last time. Debbie called office to see if pt should see PCP. Advised if protocol says to send to ER that pt should go to ER and that PCP is out all week. She advised she does think pt will go with encouragement. She will send her note.  Advised if any problems to call clinic back. Debbie verbalized understanding.

## 2020-01-13 NOTE — Telephone Encounter (Signed)
[facility] - Client TELEPHONE ADVICE RECORD [facility] Patient Name: Dominique Hall Gender: Female DOB: May 22, 1935 Age: 85 Y 9 M 20 D Return Phone Number: [phone] (Primary) Address[place] [address] Client Dominique Hall - Client Client Site [place] - [facility] Physician [name] - MD Contact Type Call Who Is Calling Patient / Member / Family / Caregiver Call Type Triage / Clinical Caller Name Lorimar Smalls Relationship To Patient Spouse Return Phone Number [phone] (Primary) Chief Complaint Weakness, Generalized Reason for Call Symptomatic / Request for [place] states that his wife is having episodes of weakness and when she has them she has to lay down. If she is sitting down she is ok, but when she is up walking around or doing something she has more episodes and feels lightheaded. Caller states that she has been checking her blood pressure and its been all over the place. Translation No Nurse Assessment Nurse: Zorita Pang, RN, Deborah Date/Time (Eastern Time): 01/13/2020 10:08:04 AM Confirm and document reason for call. If symptomatic, describe symptoms. ---The caller states that she has been having episodes of feeling weak and light headed. She states that she feels good sitting. Has the patient had close contact with a person known or suspected to have the novel coronavirus illness OR traveled / lives in area with major community spread (including international travel) in the last 14 days from the onset of symptoms? * If Asymptomatic, screen for exposure and travel within the last 14 days. ---No Does the patient have any new or worsening symptoms? ---Yes Will a triage be completed? ---Yes Related visit to physician within the last 2 weeks? ---No Does the PT have any chronic conditions? (i.e. diabetes, asthma, this includes High risk  factors for pregnancy, etc.) ---Yes List chronic conditions. ---Takes hydrochlorothiazide, omeprazole, and meloxicam Is this a behavioral health or substance abuse call? ---No PLEASE NOTE: All timestamps contained within this report are represented as [place] Standard Time. CONFIDENTIALTY NOTICE: This fax transmission is intended only for the addressee. It contains information that is legally privileged, confidential or otherwise protected from use or disclosure. If you are not the intended recipient, you are strictly prohibited from reviewing, disclosing, copying using or disseminating any of this information or taking any action in reliance on or regarding this information. If you have received this fax in error, please notify us immediately by telephone so that we can arrange for its return to [place]. Phone: [phone], Toll-Free: [phone], Fax: [phone] Page: 2 of 2 Call Id: [id number] Guidelines Guideline Title Affirmed Question Affirmed Notes Nurse Date/Time Eilene Ghazi Time) Weakness (Generalized) and Fatigue Extra heart beats OR irregular heart beating (i.e., "palpitations") Womble, RN, Neoma Laming 01/13/2020 10:11:06 AM Disp. Time Eilene Ghazi Time) Disposition Final User 01/13/2020 10:23:19 AM Go to ED Now Yes Zorita Pang, RN, Garrel Ridgel Disagree/Comply Comply Caller Understands Yes PreDisposition Go to ED Care Advice Given Per Guideline GO TO ED NOW: * You need to be seen in the Emergency Department. Comments User: Marquis Buggy, RN Date/Time Eilene Ghazi Time): 01/13/2020 10:24:50 AM The caller did not want to go to the ED as she states the wait time is terrible. This nurse called her provider's office to see if she could be seen there and her MD is out all week. She reluctantly states that she will go to the ED. Referrals GO TO FACILITY UNDECIDED

## 2020-01-13 NOTE — Telephone Encounter (Signed)
Pt called to clinic reporting she does not want to go to the ER.  Pt c/o "a feeling comes over my body" and weakness x 3 days. Pt reports the" feeling" is like lightheadedness/dizziness. Pt is taking HCTZ and eating but reports she does not drink enough water.  Pt denies SOB, chest pain, radiating arm or jaw pain or any nausea. Pt took BP while on the phone 3 times, 140/80, 125/77, 128/78. Advised no apt were available today. Pt requested apt tomorrow. Scheduled pt wit Dr. Damita Dunnings. Advised pt if any changes in symptoms or if any symptoms occur discussed earlier pt is to call 911. Pt verbalized understanding.

## 2020-01-13 NOTE — Telephone Encounter (Signed)
Noted. Thanks.

## 2020-01-14 ENCOUNTER — Ambulatory Visit (INDEPENDENT_AMBULATORY_CARE_PROVIDER_SITE_OTHER): Payer: Medicare Other | Admitting: Family Medicine

## 2020-01-14 ENCOUNTER — Other Ambulatory Visit: Payer: Self-pay

## 2020-01-14 ENCOUNTER — Encounter: Payer: Self-pay | Admitting: Family Medicine

## 2020-01-14 VITALS — BP 130/72 | HR 82 | Temp 96.0°F | Ht 64.0 in | Wt 154.1 lb

## 2020-01-14 DIAGNOSIS — I10 Essential (primary) hypertension: Secondary | ICD-10-CM

## 2020-01-14 LAB — BASIC METABOLIC PANEL
BUN: 22 mg/dL (ref 6–23)
CO2: 31 mEq/L (ref 19–32)
Calcium: 9.8 mg/dL (ref 8.4–10.5)
Chloride: 102 mEq/L (ref 96–112)
Creatinine, Ser: 0.78 mg/dL (ref 0.40–1.20)
GFR: 70.57 mL/min (ref >60.00)
Glucose, Bld: 76 mg/dL (ref 70–99)
Potassium: 3.8 mEq/L (ref 3.5–5.1)
Sodium: 140 mEq/L (ref 135–145)

## 2020-01-14 LAB — CBC WITH DIFFERENTIAL/PLATELET
Basophils Absolute: 0 10*3/uL (ref 0.0–0.1)
Basophils Relative: 0.3 % (ref 0.0–3.0)
Eosinophils Absolute: 0.1 10*3/uL (ref 0.0–0.7)
Eosinophils Relative: 1.6 % (ref 0.0–5.0)
HCT: 37.2 % (ref 36.0–46.0)
Hemoglobin: 12.8 g/dL (ref 12.0–15.0)
Lymphocytes Relative: 20.4 % (ref 12.0–46.0)
Lymphs Abs: 1.3 10*3/uL (ref 0.7–4.0)
MCHC: 34.4 g/dL (ref 30.0–36.0)
MCV: 85.6 fl (ref 78.0–100.0)
Monocytes Absolute: 0.5 10*3/uL (ref 0.1–1.0)
Monocytes Relative: 7 % (ref 3.0–12.0)
Neutro Abs: 4.6 10*3/uL (ref 1.4–7.7)
Neutrophils Relative %: 70.7 % (ref 43.0–77.0)
Platelets: 166 10*3/uL (ref 150.0–400.0)
RBC: 4.35 Mil/uL (ref 3.87–5.11)
RDW: 13.1 % (ref 11.5–15.5)
WBC: 6.5 10*3/uL (ref 4.0–10.5)

## 2020-01-14 LAB — TSH: TSH: 1.15 u[IU]/mL (ref 0.35–4.50)

## 2020-01-14 MED ORDER — HYDROCHLOROTHIAZIDE 12.5 MG PO CAPS: ORAL_CAPSULE | ORAL | Status: DC

## 2020-01-14 MED ORDER — OMEPRAZOLE 40 MG PO CPDR: 40.0000 mg | DELAYED_RELEASE_CAPSULE | Freq: Every day | ORAL | Status: DC

## 2020-01-14 NOTE — Progress Notes (Signed)
This visit occurred during the SARS-CoV-2 public health emergency.  Safety protocols were in place, including screening questions prior to the visit, additional usage of staff PPE, and extensive cleaning of exam room while observing appropriate contact time as indicated for disinfecting solutions.  Sx started about 4 days ago with "spells."  She has occ felt lightheaded.  Still on HCTZ at baseline.  "spells" last about 15 seconds then resolve.  One episode initially, a few in the last few days.  She thought it was anxiety related "but I don't know what I would be scared of."  No sx today.  No CP.  Minimally/possibly SOB at the time of the episodes and she isn't sure she is really SOB at the time.  Episodes tend to happen after eating in the AM.  She doesn't have orthostatic sx.  Sx can happen while sitting.  No syncope.  She felt a little lightheaded.  Room spinning.  No focal neuro changes, no weakness.  No palpitations.    Meds, vitals, and allergies reviewed.   ROS: Per HPI unless specifically indicated in ROS section   GEN: nad, alert and oriented HEENT: ncat NECK: supple w/o LA CV: rrr PULM: ctab, no inc wob ABD: soft, +bs EXT: no edema SKIN: no acute rash

## 2020-01-14 NOTE — Patient Instructions (Addendum)
Go to the lab on the way out.   Hold the HCTZ for now.  Update me in a few days about how you feel and about your BP.  Let me know if you are still having episodes and if you notice pulse changes.   Take care.  Glad to see you.

## 2020-01-17 NOTE — Assessment & Plan Note (Signed)
Discussed options.  Still okay for outpatient follow-up Hold HCTZ for now.  She can update me in a few days about how she is feeling and about her BP.  She can let me know if she is still having episodes and if she notices a pulse change during the episodes.  She is able to check her pulse.  See notes on labs.

## 2020-01-24 NOTE — Progress Notes (Signed)
Date:  01/25/2020   ID:  Dominique Hall, DOB 1935-05-29, MRN [id number]  Patient Location:  [place] [address]   Provider location:   [facility], [place] office  PCP:  Venia Carbon, MD  Cardiologist:  [facility]  Chief Complaint  Patient presents with  . Other    Patient c/o some weakness. Meds reviewed verbally with patient.     History of Present Illness:    Dominique Hall is a 85 y.o. female  past medical history of  chest pain.  palpitations  not diabetic and not a smoker. tachycardia in 2005 ,hospitalized at [facility] with negative work-up.   Who presents for routine follow-up of her chest pain episodes  01/2020 with PMD, notes reviewed  "spells."  She has occ felt lightheaded.    last about 15 seconds then resolve.    thought it was anxiety   Episodes tend to happen after eating in the AM.  She doesn't have orthostatic sx.  Sx can happen while sitting.  No syncope.    On further discussion today she reports having these episodes as a "Sinking feeling", <2 min Lightheaded, feels like going to faint Never had one in the bed More standing  No significant palpitations Actives, walks a lot  Seen by PMD, HCTZ held Better since then "bad about drinking" BP the same without HCTZ  Denies any significant chest pain  EKG personally reviewed by myself on todays visit Shows normal sinus rhythm with rate 75 bpm no significant ST-T wave changes  Other past medical history reviewed Prior echocardiogram and stress test No ischemia, normal ejection fraction    Prior CV studies:   The following studies were reviewed today:  Stress test 07/2019 Pharmacological myocardial perfusion imaging study with no significant  ischemia Grossly Normal wall motion, Unable to estimate EF secondary to GI uptake artifact No EKG changes concerning for ischemia at peak stress or in recovery. Low risk scan   Echo  07/2019  1. Left  ventricular ejection fraction, by visual estimation, is 60 to 65%. The left ventricle has normal function. Normal left ventricular size. There is no left ventricular hypertrophy.  2. Left ventricular diastolic Doppler parameters are consistent with impaired relaxation pattern of LV diastolic filling.  3. Global right ventricle has normal systolic function.The right ventricular size is normal. No increase in right ventricular wall thickness.  4. Mild to moderately elevated pulmonary artery systolic pressure 42 mm Hg.  5. Left atrial size was mildly dilated.  6. Right atrial size was normal.    Past Medical History:  Diagnosis Date  . Allergy   . Arthritis   . Diverticulosis   . Esophageal dysmotility   . Esophageal ring   . GERD (gastroesophageal reflux disease)   . Hiatal hernia   . HTN (hypertension)   . IBS (irritable bowel syndrome)   . Osteopenia   . Renal disorder   . Sleep disturbance    Past Surgical History:  Procedure Laterality Date  . ABDOMINAL HYSTERECTOMY    . APPENDECTOMY  1973  . BREAST EXCISIONAL BIOPSY Right   . CHOLECYSTECTOMY    . ERCP N/A 11/22/2017   Procedure: ENDOSCOPIC RETROGRADE CHOLANGIOPANCREATOGRAPHY (ERCP);  Surgeon: Lucilla Lame, MD;  Location[facility] ENDOSCOPY;  Service: Endoscopy;  Laterality: N/A;  . ESOPHAGOGASTRODUODENOSCOPY  07-2006   with dilation  . [place]  . lumpectomy  [id number]   benign right breast  .  SQUAMOUS CELL CARCINOMA EXCISION  6/12   Right leg--Dr Pat Patrick  . SQUAMOUS CELL CARCINOMA EXCISION  2012   right calf  . TOTAL ABDOMINAL HYSTERECTOMY W/ BILATERAL SALPINGOOPHORECTOMY  1973     Allergies:   Penicillins, Rabeprazole sodium, Sulfonamide derivatives, and Cefuroxime   Social History   Tobacco Use  . Smoking status: Former Research scientist (life sciences)  . Smokeless tobacco: Never Used  Substance Use Topics  . Alcohol use: Yes    Comment: occ  . Drug use: No     Current Outpatient Medications on File Prior to Visit  Medication Sig  Dispense Refill  . clorazepate (TRANXENE) 7.5 MG tablet TAKE 1 TO 2 TABLETS BY MOUTH AT BEDTIME AS NEEDED. DX CODE G47.00 60 tablet 0  . meloxicam (MOBIC) 7.5 MG tablet TAKE 1 TABLET BY MOUTH EVERY DAY 90 tablet 3  . Multiple Vitamin (MULTIVITAMIN) tablet Take 1 tablet by mouth daily.    . NON FORMULARY Immunity support gummies Takes 1 gummy daily.    . NON FORMULARY Black elderberry gummy Takes 1 gummy qd.    . Omega-3 Fatty Acids (FISH OIL PO) Take 1 capsule by mouth daily.    Marland Kitchen omeprazole (PRILOSEC) 40 MG capsule Take 1 capsule (40 mg total) by mouth daily.    . vitamin C (ASCORBIC ACID) 500 MG tablet Take 500 mg by mouth daily.    . hydrochlorothiazide (MICROZIDE) 12.5 MG capsule Held as of 01/14/20 (Patient not taking: Reported on 01/25/2020)     No current facility-administered medications on file prior to visit.     Family Hx: The patient's family history includes Heart attack in her father; Melanoma in her daughter; Stroke in her mother.  ROS:   Please see the history of present illness.    Review of Systems  Constitutional: Negative.   HENT: Negative.   Respiratory: Negative.   Cardiovascular: Negative.   Gastrointestinal: Negative.   Musculoskeletal: Negative.   Neurological: Negative.   Psychiatric/Behavioral: Negative.   All other systems reviewed and are negative.    Labs/Other Tests and Data Reviewed:    Recent Labs: 05/06/2019: ALT 13 01/14/2020: BUN 22; Creatinine, Ser 0.78; Hemoglobin 12.8; Platelets 166.0; Potassium 3.8; Sodium 140; TSH 1.15   Recent Lipid Panel Lab Results  Component Value Date/Time   CHOL 152 02/22/2014 04:12 PM   TRIG 98.0 02/22/2014 04:12 PM   HDL 63.00 02/22/2014 04:12 PM   CHOLHDL 2 02/22/2014 04:12 PM   LDLCALC 69 02/22/2014 04:12 PM    Wt Readings from Last 3 Encounters:  01/25/20 159 lb (72.1 kg)  01/14/20 154 lb 2 oz (69.9 kg)  08/05/19 150 lb (68 kg)    Exam:    Vital Signs: Vital signs may also be detailed in the HPI BP  (!) 160/70 (BP Location: Left Arm, Patient Position: Sitting, Cuff Size: Normal)   Pulse 75   Ht 5\' 4"  (1.626 m)   Wt 159 lb (72.1 kg)   SpO2 98%   BMI 27.29 kg/m    Constitutional:  oriented to person, place, and time. No distress.  HENT:  Head: Grossly normal Eyes:  no discharge. No scleral icterus.  Neck: No JVD, no carotid bruits  Cardiovascular: Regular rate and rhythm, no murmurs appreciated Pulmonary/Chest: Clear to auscultation bilaterally, no wheezes or rails Abdominal: Soft.  no distension.  no tenderness.  Musculoskeletal: Normal range of motion Neurological:  normal muscle tone. Coordination normal. No atrophy Skin: Skin warm and dry Psychiatric: normal affect, pleasant   ASSESSMENT &  PLAN:    Problem List Items Addressed This Visit    None    Visit Diagnoses    Chest pain, unspecified type    -  Primary   Relevant Orders   EKG 12-Lead   SOB (shortness of breath)       Essential hypertension         Chest pain: Denies chest pain,  No further work-up needed  Shortness of breath Stable Suggest continues regular walking program  Dizziness Etiology unclear Somewhat better holding HCTZ Recommend she continue to monitor blood pressure at home If symptoms persist could order a ZIO monitor, this was discussed with her She will check blood pressure pulse when she has an episode  Essential hypertension Running high today, she will check home numbers Recently stopped HCTZ Feels that at baseline blood pressure well controlled  Follow-up as needed   Total encounter time more than 25 minutes  Greater than 50% was spent in counseling and coordination of care with the patient   Signed, Ida Rogue, [place] [address], [place], [place] [address]

## 2020-01-25 ENCOUNTER — Other Ambulatory Visit: Payer: Self-pay

## 2020-01-25 ENCOUNTER — Encounter: Payer: Self-pay | Admitting: Cardiovascular Disease

## 2020-01-25 ENCOUNTER — Ambulatory Visit (INDEPENDENT_AMBULATORY_CARE_PROVIDER_SITE_OTHER): Payer: Medicare Other | Admitting: Cardiovascular Disease

## 2020-01-25 VITALS — BP 160/70 | HR 75 | Ht 64.0 in | Wt 159.0 lb

## 2020-01-25 DIAGNOSIS — I10 Essential (primary) hypertension: Secondary | ICD-10-CM | POA: Diagnosis not present

## 2020-01-25 DIAGNOSIS — R0602 Shortness of breath: Secondary | ICD-10-CM

## 2020-01-25 DIAGNOSIS — R079 Chest pain, unspecified: Secondary | ICD-10-CM | POA: Diagnosis not present

## 2020-01-25 NOTE — Patient Instructions (Addendum)
Monitor blood pressure at home Call if elevated  Goal <140 on top number  Check BP and pulse when having a spell   Medication Instructions:  No changes  If you need a refill on your cardiac medications before your next appointment, please call your pharmacy.   Lab work: No new labs needed   If you have labs (blood work) drawn today and your tests are completely normal, you will receive your results only by: Marland Kitchen MyChart Message (if you have MyChart) OR . A paper copy in the mail If you have any lab test that is abnormal or we need to change your treatment, we will call you to review the results.   Testing/Procedures: No new testing needed   Follow-Up: At [facility], you and your health needs are our priority.  As part of our continuing mission to provide you with exceptional heart care, we have created designated Provider Care Teams.  These Care Teams include your primary Cardiologist (physician) and Advanced Practice Providers (APPs -  Physician Assistants and Nurse Practitioners) who all work together to provide you with the care you need, when you need it.  . You will need a follow up appointment as needed   . Providers on your designated Care Team:   . Murray Hodgkins, NP . Christell Faith, PA-C . Marrianne Mood, PA-C  Any Other Special Instructions Will Be Listed Below (If Applicable).  For educational health videos Log in to : [email or web address].com Or : [email or web address], password : [email or web address]   How to Take Your Blood Pressure You can take your blood pressure at home with a machine. You may need to check your blood pressure at home:  To check if you have high blood pressure (hypertension).  To check your blood pressure over time.  To make sure your blood pressure medicine is working. Supplies needed: You will need a blood pressure machine, or monitor. You can buy one at a drugstore or online. When choosing one:  Choose one with an arm cuff.  Choose one that wraps  around your upper arm. Only one finger should fit between your arm and the cuff.  Do not choose one that measures your blood pressure from your wrist or finger. Your doctor can suggest a monitor. How to prepare Avoid these things for 30 minutes before checking your blood pressure:  Drinking caffeine.  Drinking alcohol.  Eating.  Smoking.  Exercising. Five minutes before checking your blood pressure:  Pee.  Sit in a dining chair. Avoid sitting in a soft couch or armchair.  Be quiet. Do not talk. How to take your blood pressure Follow the instructions that came with your machine. If you have a digital blood pressure monitor, these may be the instructions: 1. Sit up straight. 2. Place your feet on the floor. Do not cross your ankles or legs. 3. Rest your left arm at the level of your heart. You may rest it on a table, desk, or chair. 4. Pull up your shirt sleeve. 5. Wrap the blood pressure cuff around the upper part of your left arm. The cuff should be 1 inch (2.5 cm) above your elbow. It is best to wrap the cuff around bare skin. 6. Fit the cuff snugly around your arm. You should be able to place only one finger between the cuff and your arm. 7. Put the cord inside the groove of your elbow. 8. Press the power button. 9. Sit quietly while the cuff fills with air and loses air. 10.  Write down the numbers on the screen. 11. Wait 2-3 minutes and then repeat steps 1-10. What do the numbers mean? Two numbers make up your blood pressure. The first number is called systolic pressure. The second is called diastolic pressure. An example of a blood pressure reading is "120 over 80" (or 120/80). If you are an adult and do not have a medical condition, use this guide to find out if your blood pressure is normal: Normal  First number: below 120.  Second number: below 80. Elevated  First number: 120-129.  Second number: below 80. Hypertension stage 1  First number:  130-139.  Second number: 80-89. Hypertension stage 2  First number: 140 or above.  Second number: 28 or above. Your blood pressure is above normal even if only the top or bottom number is above normal. Follow these instructions at home:  Check your blood pressure as often as your doctor tells you to.  Take your monitor to your next doctor's appointment. Your doctor will: ? Make sure you are using it correctly. ? Make sure it is working right.  Make sure you understand what your blood pressure numbers should be.  Tell your doctor if your medicines are causing side effects. Contact a doctor if:  Your blood pressure keeps being high. Get help right away if:  Your first blood pressure number is higher than 180.  Your second blood pressure number is higher than 120. This information is not intended to replace advice given to you by your health care provider. Make sure you discuss any questions you have with your health care provider. Document Revised: 10/11/2017 Document Reviewed: 04/06/2016 Elsevier Patient Education  2020 [employer].

## 2020-01-26 ENCOUNTER — Other Ambulatory Visit: Payer: Self-pay | Admitting: Internal Medicine

## 2020-01-26 DIAGNOSIS — G4700 Insomnia, unspecified: Secondary | ICD-10-CM

## 2020-01-28 ENCOUNTER — Other Ambulatory Visit: Payer: Self-pay | Admitting: Internal Medicine

## 2020-01-28 DIAGNOSIS — Z1231 Encounter for screening mammogram for malignant neoplasm of breast: Secondary | ICD-10-CM

## 2020-03-08 DIAGNOSIS — M1712 Unilateral primary osteoarthritis, left knee: Secondary | ICD-10-CM | POA: Diagnosis not present

## 2020-03-08 DIAGNOSIS — M25562 Pain in left knee: Secondary | ICD-10-CM | POA: Diagnosis not present

## 2020-03-08 MED ORDER — OMEPRAZOLE 40 MG PO CPDR: 40.0000 mg | DELAYED_RELEASE_CAPSULE | Freq: Every day | ORAL | 3 refills | Status: DC

## 2020-03-08 NOTE — Telephone Encounter (Signed)
Are you okay with doing a rx?

## 2020-04-13 ENCOUNTER — Telehealth: Payer: Self-pay | Admitting: *Deleted

## 2020-04-13 NOTE — Telephone Encounter (Signed)
Patient's husband called stating that they were trying to get a refill on her Celebrex. Mr. Deperalta stated that the pharmacy told him that it could not be refill. Advised Mr. Fonti that we do not have Celebrex on her medication list. Mr. Strouth stated that they are having problems with their phone and is at [employer].now. Mr. Jewell stated that he will have to call back later to discuss.

## 2020-04-28 ENCOUNTER — Other Ambulatory Visit: Payer: Self-pay | Admitting: Internal Medicine

## 2020-04-28 DIAGNOSIS — G4700 Insomnia, unspecified: Secondary | ICD-10-CM

## 2020-04-28 NOTE — Telephone Encounter (Signed)
Last filled 01-12-20 #60 Last OV 01-14-20 Next OV 05-09-20 [facility]

## 2020-05-02 ENCOUNTER — Other Ambulatory Visit: Payer: Self-pay

## 2020-05-02 ENCOUNTER — Ambulatory Visit
Admission: RE | Admit: 2020-05-02 | Discharge: 2020-05-02 | Disposition: A | Discharge status: Home / Self Care | Payer: PPO | Source: Ambulatory Visit | Attending: Internal Medicine | Admitting: Internal Medicine

## 2020-05-02 DIAGNOSIS — Z1231 Encounter for screening mammogram for malignant neoplasm of breast: Secondary | ICD-10-CM

## 2020-05-09 ENCOUNTER — Encounter: Payer: Self-pay | Admitting: Internal Medicine

## 2020-05-09 ENCOUNTER — Ambulatory Visit (INDEPENDENT_AMBULATORY_CARE_PROVIDER_SITE_OTHER): Payer: PPO | Admitting: Internal Medicine

## 2020-05-09 ENCOUNTER — Other Ambulatory Visit: Payer: Self-pay

## 2020-05-09 VITALS — BP 118/74 | HR 89 | Temp 96.9°F | Ht 63.5 in | Wt 158.0 lb

## 2020-05-09 DIAGNOSIS — Z0000 Encounter for general adult medical examination without abnormal findings: Secondary | ICD-10-CM | POA: Diagnosis not present

## 2020-05-09 DIAGNOSIS — I10 Essential (primary) hypertension: Secondary | ICD-10-CM

## 2020-05-09 DIAGNOSIS — F39 Unspecified mood [affective] disorder: Secondary | ICD-10-CM | POA: Diagnosis not present

## 2020-05-09 DIAGNOSIS — K219 Gastro-esophageal reflux disease without esophagitis: Secondary | ICD-10-CM

## 2020-05-09 DIAGNOSIS — Z7189 Other specified counseling: Secondary | ICD-10-CM

## 2020-05-09 DIAGNOSIS — M159 Polyosteoarthritis, unspecified: Secondary | ICD-10-CM | POA: Diagnosis not present

## 2020-05-09 MED ORDER — CELECOXIB 200 MG PO CAPS: 200.0000 mg | ORAL_CAPSULE | Freq: Every day | ORAL | 3 refills | Status: DC

## 2020-05-09 MED ORDER — OMEPRAZOLE 20 MG PO CPDR: 20.0000 mg | DELAYED_RELEASE_CAPSULE | Freq: Every day | ORAL | 3 refills | Status: DC

## 2020-05-09 NOTE — Assessment & Plan Note (Signed)
I have personally reviewed the Medicare Annual Wellness questionnaire and have noted 1. The patient's medical and social history 2. Their use of alcohol, tobacco or illicit drugs 3. Their current medications and supplements 4. The patient's functional ability including ADL's, fall risks, home safety risks and hearing or visual             impairment. 5. Diet and physical activities 6. Evidence for depression or mood disorders  The patients weight, height, BMI and visual acuity have been recorded in the chart I have made referrals, counseling and provided education to the patient based review of the above and I have provided the pt with a written personalized care plan for preventive services.  I have provided you with a copy of your personalized plan for preventive services. Please take the time to review along with your updated medication list.  No cancer screening due to age Recent mammogram---advised that even with dense breasts, her benefits are not sufficient to continue Yearly flu vaccine Needs to get 2nd shingrix Restart exercise program

## 2020-05-09 NOTE — Assessment & Plan Note (Signed)
See social history 

## 2020-05-09 NOTE — Assessment & Plan Note (Signed)
BP Readings from Last 3 Encounters:  05/09/20 118/74  01/25/20 (!) 160/70  01/14/20 130/72   Doing well on the HCTZ Recent labs okay--will defer

## 2020-05-09 NOTE — Assessment & Plan Note (Signed)
Does well with the celebrex On PPI Renal function normal Will continue

## 2020-05-09 NOTE — Assessment & Plan Note (Signed)
Doing well on PPI Will reduce the dose of omeprazole

## 2020-05-09 NOTE — Progress Notes (Signed)
Subjective:    Patient ID: Dominique Hall, female    DOB: Aug 14, 1935, 85 y.o.   MRN: [id number]  HPI Here for Medicare wellness visit and follow up of chronic health conditions This visit occurred during the SARS-CoV-2 public health emergency.  Safety protocols were in place, including screening questions prior to the visit, additional usage of staff PPE, and extensive cleaning of exam room while observing appropriate contact time as indicated for disinfecting solutions.   Reviewed form and advanced directives Reviewed other doctors Often will have a glass of wine No tobacco Not really exercising--discussed Vision is okay Hearing aides do help Independent with instrumental ADLs No memory problems  Reviewed echo and stress test last fall after chest pain No problems since then No chest pain or SOB now No dizziness or syncope No edema No headaches  Seen in March ---had "spell" Briefly held HCTZ--then restarted No problems since then  Still on the celebrex daily Hasn't tolerated weaning Is on PPI  Continues on the daily omeprazole She thinks 20mg  will be enough No heartburn or dysphagia on this  Does get spells of anxiety in certain circumstances No depression or anhedonia  Current Outpatient Medications on File Prior to Visit  Medication Sig Dispense Refill   celecoxib (CELEBREX) 200 MG capsule Take 1 capsule by mouth daily.     Cholecalciferol (VITAMIN D-3) 25 MCG (1000 UT) CAPS Take 1 capsule by mouth daily.     clorazepate (TRANXENE) 7.5 MG tablet TAKE 1 TO 2 TABLETS BY MOUTH AT BEDTIME AS NEEDED. DX CODE G47.00 60 tablet 0   hydrochlorothiazide (MICROZIDE) 12.5 MG capsule Held as of 01/14/20     Multiple Vitamin (MULTIVITAMIN) tablet Take 1 tablet by mouth daily.     NON FORMULARY Immunity support gummies Takes 1 gummy daily.     NON FORMULARY Black elderberry gummy Takes 1 gummy qd.     Omega-3 Fatty Acids (FISH OIL PO) Take 1 capsule by mouth daily.      vitamin C (ASCORBIC ACID) 500 MG tablet Take 500 mg by mouth daily.     [DISCONTINUED] omeprazole (PRILOSEC) 40 MG capsule Take 1 capsule (40 mg total) by mouth daily. 90 capsule 3   No current facility-administered medications on file prior to visit.    Allergies  Allergen Reactions   Penicillins     Has patient had a PCN reaction causing immediate rash, facial/tongue/throat swelling, SOB or lightheadedness with hypotension: Unknown Has patient had a PCN reaction causing severe rash involving mucus membranes or skin necrosis: Unknown Has patient had a PCN reaction that required hospitalization: Unknown Has patient had a PCN reaction occurring within the last 10 years: Unknown If all of the above answers are "NO", then may proceed with Cephalosporin use.   Rabeprazole Sodium     REACTION: constipated   Sulfonamide Derivatives     REACTION: unspecified   Cefuroxime     diarrhea    Past Medical History:  Diagnosis Date   Allergy    Arthritis    Diverticulosis    Esophageal dysmotility    Esophageal ring    GERD (gastroesophageal reflux disease)    Hiatal hernia    HTN (hypertension)    IBS (irritable bowel syndrome)    Osteopenia    Renal disorder    Sleep disturbance     Past Surgical History:  Procedure Laterality Date   ABDOMINAL HYSTERECTOMY     APPENDECTOMY  1973   BREAST EXCISIONAL BIOPSY Right  CHOLECYSTECTOMY     ERCP N/A 11/22/2017   Procedure: ENDOSCOPIC RETROGRADE CHOLANGIOPANCREATOGRAPHY (ERCP);  Surgeon: Lucilla Lame, MD;  Location: [facility] ENDOSCOPY;  Service: Endoscopy;  Laterality: N/A;   ESOPHAGOGASTRODUODENOSCOPY  07-2006   with dilation   KIDNEY SURGERY  1970   lumpectomy  [phone]   benign right breast   SQUAMOUS CELL CARCINOMA EXCISION  6/12   Right leg--Dr Cresenciano Lick CELL CARCINOMA EXCISION  2012   right calf   TOTAL ABDOMINAL HYSTERECTOMY W/ BILATERAL SALPINGOOPHORECTOMY  1973    Family History  Problem  Relation Age of Onset   Heart attack Father    Stroke Mother    Melanoma Daughter     Social History   Socioeconomic History   Marital status: Married    Spouse name: Not on file   Number of children: 2   Years of education: Not on file   Highest education level: Not on file  Occupational History   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  Tobacco Use   Smoking status: Former Smoker   Smokeless tobacco: Never Used  Substance and Sexual Activity   Alcohol use: Yes    Comment: occ   Drug use: No   Sexual activity: Not on file  Other Topics Concern   Not on file  Social History Narrative   Has living will   Husband is [place] health care POA---alternate is daughter Sula Soda   Would accept resuscitation but no prolonged ventilation   Probably would not want tube feedings if cognitively unaware   Social Determinants of Health   Financial Resource Strain:    Difficulty of Paying Living Expenses:   Food Insecurity:    Worried About Charity fundraiser in the Last Year:    Arboriculturist in the Last Year:   Transportation Needs:    Film/video editor (Medical):    Lack of Transportation (Non-Medical):   Physical Activity:    Days of Exercise per Week:    Minutes of Exercise per Session:   Stress:    Feeling of Stress :   Social Connections:    Frequency of Communication with Friends and Family:    Frequency of Social Gatherings with Friends and Family:    Attends Religious Services:    Active Member of Clubs or Organizations:    Attends Music therapist:    Marital Status:   Intimate Partner Violence:    Fear of Current or Ex-Partner:    Emotionally Abused:    Physically Abused:    Sexually Abused:    Review of Systems Appetite is good Weight is stable Wears seat belt Sleeps well Teeth okay---keeps up with dentist No rash or skin issues Bowels move fine---no blood No dysuria. Some urgency---but no incontinence Left knee  Baker's cyst---this is the most common pain area.  No other back or joint pain     Objective:   Physical Exam Constitutional:      General: She is not in acute distress.    Appearance: Normal appearance.  HENT:     Head: Normocephalic and atraumatic.     Mouth/Throat:     Mouth: Mucous membranes are moist.     Comments: No oral lesions Cardiovascular:     Rate and Rhythm: Normal rate and regular rhythm.     Pulses: Normal pulses.     Heart sounds: No murmur heard.  No gallop.   Pulmonary:     Effort: Pulmonary effort is normal.  Breath sounds: Normal breath sounds. No wheezing or rales.  Abdominal:     Palpations: Abdomen is soft.     Tenderness: There is no abdominal tenderness.  Musculoskeletal:     Cervical back: Neck supple.     Right lower leg: No edema.     Left lower leg: No edema.  Lymphadenopathy:     Cervical: No cervical adenopathy.  Skin:    General: Skin is warm.     Comments: No lesions  Neurological:     Mental Status: She is alert and oriented to person, place, and time.     Comments: President--- "Biden, Trump, ?" [phone] [id number] Recall 2/3            Assessment & Plan:

## 2020-05-09 NOTE — Assessment & Plan Note (Signed)
Mild situational anxiety Does well with occasional clorazepate

## 2020-05-23 DIAGNOSIS — H2513 Age-related nuclear cataract, bilateral: Secondary | ICD-10-CM | POA: Diagnosis not present

## 2020-05-25 ENCOUNTER — Ambulatory Visit (INDEPENDENT_AMBULATORY_CARE_PROVIDER_SITE_OTHER): Payer: 59 | Admitting: Podiatry

## 2020-05-25 ENCOUNTER — Encounter: Payer: Self-pay | Admitting: Podiatry

## 2020-05-25 ENCOUNTER — Other Ambulatory Visit: Payer: Self-pay

## 2020-05-25 DIAGNOSIS — L600 Ingrowing nail: Secondary | ICD-10-CM

## 2020-05-25 MED ORDER — NEOMYCIN-POLYMYXIN-HC 1 % OT SOLN
OTIC | 1 refills | Status: DC
Start: 2020-05-25 — End: 2020-10-10

## 2020-05-25 NOTE — Progress Notes (Signed)
Subjective:  Patient ID: Dominique Hall, female    DOB: 23-Sep-1935,  MRN: [id number] HPI Chief Complaint  Patient presents with  . Nail Problem    Patient presents today to discuss treatment options for thick dystrophic nail left hallux x years.  She denies any pain to the nail    85 y.o. female presents with the above complaint.   ROS: Denies fever chills nausea vomiting muscle aches pains calf pain back pain chest pain shortness of breath.  Past Medical History:  Diagnosis Date  . Allergy   . Arthritis   . Diverticulosis   . Esophageal dysmotility   . Esophageal ring   . GERD (gastroesophageal reflux disease)   . Hiatal hernia   . HTN (hypertension)   . IBS (irritable bowel syndrome)   . Osteopenia   . Renal disorder   . Sleep disturbance    Past Surgical History:  Procedure Laterality Date  . ABDOMINAL HYSTERECTOMY    . APPENDECTOMY  1973  . BREAST EXCISIONAL BIOPSY Right   . CHOLECYSTECTOMY    . ERCP N/A 11/22/2017   Procedure: ENDOSCOPIC RETROGRADE CHOLANGIOPANCREATOGRAPHY (ERCP);  Surgeon: Lucilla Lame, MD;  Location: [facility] ENDOSCOPY;  Service: Endoscopy;  Laterality: N/A;  . ESOPHAGOGASTRODUODENOSCOPY  07-2006   with dilation  . [place]  . lumpectomy  [phone]   benign right breast  . SQUAMOUS CELL CARCINOMA EXCISION  6/12   Right leg--Dr Pat Patrick  . SQUAMOUS CELL CARCINOMA EXCISION  2012   right calf  . TOTAL ABDOMINAL HYSTERECTOMY W/ BILATERAL SALPINGOOPHORECTOMY  1973    Current Outpatient Medications:  .  celecoxib (CELEBREX) 200 MG capsule, Take 1 capsule (200 mg total) by mouth daily., Disp: 90 capsule, Rfl: 3 .  Cholecalciferol (VITAMIN D-3) 25 MCG (1000 UT) CAPS, Take 1 capsule by mouth daily., Disp: , Rfl:  .  clorazepate (TRANXENE) 7.5 MG tablet, TAKE 1 TO 2 TABLETS BY MOUTH AT BEDTIME AS NEEDED. DX CODE G47.00, Disp: 60 tablet, Rfl: 0 .  hydrochlorothiazide (MICROZIDE) 12.5 MG capsule, Held as of 01/14/20, Disp: , Rfl:  .  Multiple  Vitamin (MULTIVITAMIN) tablet, Take 1 tablet by mouth daily., Disp: , Rfl:  .  NEOMYCIN-POLYMYXIN-HYDROCORTISONE (CORTISPORIN) 1 % SOLN OTIC solution, Apply 1-2 drops to toe BID after soaking, Disp: 10 mL, Rfl: 1 .  NON FORMULARY, Immunity support gummies Takes 1 gummy daily., Disp: , Rfl:  .  NON FORMULARY, Black elderberry gummy Takes 1 gummy qd., Disp: , Rfl:  .  Omega-3 Fatty Acids (FISH OIL PO), Take 1 capsule by mouth daily., Disp: , Rfl:  .  omeprazole (PRILOSEC) 20 MG capsule, Take 1 capsule (20 mg total) by mouth daily., Disp: 90 capsule, Rfl: 3 .  vitamin C (ASCORBIC ACID) 500 MG tablet, Take 500 mg by mouth daily., Disp: , Rfl:   Allergies  Allergen Reactions  . Penicillins     Has patient had a PCN reaction causing immediate rash, facial/tongue/throat swelling, SOB or lightheadedness with hypotension: Unknown Has patient had a PCN reaction causing severe rash involving mucus membranes or skin necrosis: Unknown Has patient had a PCN reaction that required hospitalization: Unknown Has patient had a PCN reaction occurring within the last 10 years: Unknown If all of the above answers are "NO", then may proceed with Cephalosporin use.  . Rabeprazole Sodium     REACTION: constipated  . Sulfonamide Derivatives     REACTION: unspecified  . Cefuroxime     diarrhea   Review of  Systems Objective:  There were no vitals filed for this visit.  General: Well developed, nourished, in no acute distress, alert and oriented x3   Dermatological: Skin is warm, dry and supple bilateral. Nails x 10 are well maintained with exception of the hallux nail left which does demonstrate a thick dry phonic type nail with distal onycholysis.; remaining integument appears unremarkable at this time. There are no open sores, no preulcerative lesions, no rash or signs of infection present.  Vascular: Dorsalis Pedis artery and Posterior Tibial artery pedal pulses are 2/4 bilateral with immedate capillary fill  time. Pedal hair growth present. No varicosities and no lower extremity edema present bilateral.   Neruologic: Grossly intact via light touch bilateral. Vibratory intact via tuning fork bilateral. Protective threshold with Semmes Wienstein monofilament intact to all pedal sites bilateral. Patellar and Achilles deep tendon reflexes 2+ bilateral. No Babinski or clonus noted bilateral.   Musculoskeletal: No gross boney pedal deformities bilateral. No pain, crepitus, or limitation noted with foot and ankle range of motion bilateral. Muscular strength 5/5 in all groups tested bilateral.  Gait: Unassisted, Nonantalgic.    Radiographs:  None taken  Assessment & Plan:   Assessment: Nail dystrophy painful in nature hallux left  Plan: Total nail avulsion with matrixectomy. She tolerated procedure well without complication. Provided her with both oral and written home-going instructions for care and soaking of the toe as well as a prescription for Corticosporin otic to be applied twice daily after soaking. I will follow-up with her in 2 weeks.     Jaleigha Deane T. [place], [place]

## 2020-05-25 NOTE — Patient Instructions (Signed)

## 2020-06-13 ENCOUNTER — Encounter: Payer: Self-pay | Admitting: Podiatry

## 2020-06-13 ENCOUNTER — Ambulatory Visit (INDEPENDENT_AMBULATORY_CARE_PROVIDER_SITE_OTHER): Payer: PPO | Admitting: Podiatry

## 2020-06-13 ENCOUNTER — Other Ambulatory Visit: Payer: Self-pay

## 2020-06-13 DIAGNOSIS — L600 Ingrowing nail: Secondary | ICD-10-CM

## 2020-06-13 DIAGNOSIS — Z9889 Other specified postprocedural states: Secondary | ICD-10-CM

## 2020-06-13 NOTE — Progress Notes (Signed)
She presents today for postop visit status post matrixectomy hallux left.  States that is doing good.  She states that she continues to soak it on a regular basis and Betadine.  Objective: No erythema cellulitis drainage or odor nail plate is off the nail bed appears to be growing in normally.  There is just some mild erythema proximally no cellulitis drainage or odor no purulence no malodor no exudate upon expression.  Assessment: Well-healing nail avulsion.  Plan: Continue to soak Epson salt and warm water until completely resolved no redness no pain coverage in the day leave open at bedtime continue the use of Corticosporin otic.

## 2020-06-26 ENCOUNTER — Other Ambulatory Visit: Payer: Self-pay | Admitting: Internal Medicine

## 2020-06-26 DIAGNOSIS — G4700 Insomnia, unspecified: Secondary | ICD-10-CM

## 2020-06-27 NOTE — Telephone Encounter (Signed)
Last filled 04-28-20 #30 Last OV 05-09-20 Next OV 05-11-21 [facility]

## 2020-08-24 ENCOUNTER — Other Ambulatory Visit: Payer: Self-pay | Admitting: Internal Medicine

## 2020-08-24 DIAGNOSIS — G4700 Insomnia, unspecified: Secondary | ICD-10-CM

## 2020-08-24 NOTE — Telephone Encounter (Signed)
Last filled 06-27-20 #60 Last OV 05-09-20 Next OV 05-11-21 [facility]

## 2020-10-10 ENCOUNTER — Encounter: Payer: Self-pay | Admitting: Primary Care

## 2020-10-10 ENCOUNTER — Telehealth (INDEPENDENT_AMBULATORY_CARE_PROVIDER_SITE_OTHER): Payer: PPO | Admitting: Primary Care

## 2020-10-10 ENCOUNTER — Other Ambulatory Visit: Payer: PPO

## 2020-10-10 ENCOUNTER — Other Ambulatory Visit: Payer: Self-pay

## 2020-10-10 DIAGNOSIS — R059 Cough, unspecified: Secondary | ICD-10-CM

## 2020-10-10 NOTE — Progress Notes (Signed)
Subjective:    Patient ID: Dominique Hall, female    DOB: 09-24-1935, 85 y.o.   MRN: [id number]  HPI  Virtual Visit via Video Note  I connected with Dominique Hall on 10/10/20 at  2:40 PM EST by a video enabled telemedicine application and verified that I am speaking with the correct person using two identifiers.  Location: Patient: Home Provider: Office Participants: Patient and myself   I discussed the limitations of evaluation and management by telemedicine and the availability of in person appointments. The patient expressed understanding and agreed to proceed.  History of Present Illness:  Dominique Hall is a 85 year old female patient of Dr. Silvio Pate with a history of hypertension, allergic rhinitis, GERD who presents today with a chief complaint of cough.  Symptoms began 1 and 1/2 days ago with nasal congestion, post nasal drip, cough.  She's had this type of illness before and knows that a Zpak will "knock it out early".  She denies fevers, loss of taste/smell, diarrhea, headaches.   She's been fully vaccinated against Covid-19. She was around a lot of people during Thanksgiving who came to her home, has also been out shopping all weekend with her granddaughter for black Friday sales.   She's been taking Zinc, Air Shield, [place], Vitamin C which are all part of her normal regimen. She has not tested for Covid-19. Her husband has no symptoms.    Observations/Objective:  Alert and oriented. Appears well, not sickly. No distress. Speaking in complete sentences. Dry cough noted during exam several times.  Assessment and Plan:  Acute URI symptoms x 36 hours. She very well could have picked up something from family during Thanksgiving or while out shopping all weekend.  She doesn't appear sickly, she does have a deep dry cough noted.  Recommended Claritin and Flonase to start. She will also come this afternoon for Covid-19 testing. Consider chest xray if cough  persists.   With 36 hours of symptoms without fevers or acute distress, I do not feel that antibiotics are warranted at this time. We discussed this and also that viral symptoms sometimes become worse before they improve.   She will update if she develops fevers along with a progressive cough.  At that point we need to rule out pneumonia.  I did ask for her to update [place] Friday this week regardless, she verbalized understanding.  Follow Up Instructions:  Please come to the office later today for Covid-19 testing.  Start Claritin, Allegra, or Zyrtec daily for throat drainage and runny nose.  Nasal Congestion/Ear Pressure/Sinus Pressure: Try using Flonase (fluticasone) nasal spray. Instill 1 spray in each nostril twice daily.   Please update me later this week as discussed.  It was a pleasure meeting you! Allie Bossier, NP-C    I discussed the assessment and treatment plan with the patient. The patient was provided an opportunity to ask questions and all were answered. The patient agreed with the plan and demonstrated an understanding of the instructions.   The patient was advised to call back or seek an in-person evaluation if the symptoms worsen or if the condition fails to improve as anticipated.    Pleas Koch, NP    Review of Systems  Constitutional: Negative for chills, fatigue and fever.  HENT: Positive for congestion, postnasal drip and rhinorrhea. Negative for sore throat.   Respiratory: Positive for cough. Negative for shortness of breath.   Allergic/Immunologic: Positive for environmental allergies.  Past Medical History:  Diagnosis Date  . Allergy   . Arthritis   . Diverticulosis   . Esophageal dysmotility   . Esophageal ring   . GERD (gastroesophageal reflux disease)   . Hiatal hernia   . HTN (hypertension)   . IBS (irritable bowel syndrome)   . Osteopenia   . Renal disorder   . Sleep disturbance      Social History   Socioeconomic History  .  Marital status: Married    Spouse name: Not on file  . Number of children: 2  . Years of education: Not on file  . Highest education level: Not on file  Occupational History  . Occupation: Lawyer  Tobacco Use  . Smoking status: Former Research scientist (life sciences)  . Smokeless tobacco: Never Used  Substance and Sexual Activity  . Alcohol use: Yes    Comment: occ  . Drug use: No  . Sexual activity: Not on file  Other Topics Concern  . Not on file  Social History Narrative   Has living will   Husband is [place] health care POA---alternate is daughters Lynne/Amanda   Would accept resuscitation but no prolonged ventilation   Probably would not want tube feedings if cognitively unaware   Social Determinants of Health   Financial Resource Strain:   . Difficulty of Paying Living Expenses: Not on file  Food Insecurity:   . Worried About Charity fundraiser in the Last Year: Not on file  . Ran Out of Food in the Last Year: Not on file  Transportation Needs:   . Lack of Transportation (Medical): Not on file  . Lack of Transportation (Non-Medical): Not on file  Physical Activity:   . Days of Exercise per Week: Not on file  . Minutes of Exercise per Session: Not on file  Stress:   . Feeling of Stress : Not on file  Social Connections:   . Frequency of Communication with Friends and Family: Not on file  . Frequency of Social Gatherings with Friends and Family: Not on file  . Attends Religious Services: Not on file  . Active Member of Clubs or Organizations: Not on file  . Attends Archivist Meetings: Not on file  . Marital Status: Not on file  Intimate Partner Violence:   . Fear of Current or Ex-Partner: Not on file  . Emotionally Abused: Not on file  . Physically Abused: Not on file  . Sexually Abused: Not on file    Past Surgical History:  Procedure Laterality Date  . ABDOMINAL HYSTERECTOMY    . APPENDECTOMY  1973  . BREAST EXCISIONAL BIOPSY Right   . CHOLECYSTECTOMY    . ERCP  N/A 11/22/2017   Procedure: ENDOSCOPIC RETROGRADE CHOLANGIOPANCREATOGRAPHY (ERCP);  Surgeon: Lucilla Lame, MD;  Location: [facility] ENDOSCOPY;  Service: Endoscopy;  Laterality: N/A;  . ESOPHAGOGASTRODUODENOSCOPY  07-2006   with dilation  . [place]  . lumpectomy  [phone]   benign right breast  . SQUAMOUS CELL CARCINOMA EXCISION  6/12   Right leg--Dr Pat Patrick  . SQUAMOUS CELL CARCINOMA EXCISION  2012   right calf  . TOTAL ABDOMINAL HYSTERECTOMY W/ BILATERAL SALPINGOOPHORECTOMY  1973    Family History  Problem Relation Age of Onset  . Heart attack Father   . Stroke Mother   . Melanoma Daughter     Allergies  Allergen Reactions  . Penicillins     Has patient had a PCN reaction causing immediate rash, facial/tongue/throat swelling, SOB or lightheadedness  with hypotension: Unknown Has patient had a PCN reaction causing severe rash involving mucus membranes or skin necrosis: Unknown Has patient had a PCN reaction that required hospitalization: Unknown Has patient had a PCN reaction occurring within the last 10 years: Unknown If all of the above answers are "NO", then may proceed with Cephalosporin use.  . Rabeprazole Sodium     REACTION: constipated  . Sulfonamide Derivatives     REACTION: unspecified  . Cefuroxime     diarrhea    Current Outpatient Medications on File Prior to Visit  Medication Sig Dispense Refill  . celecoxib (CELEBREX) 200 MG capsule Take 1 capsule (200 mg total) by mouth daily. 90 capsule 3  . Cholecalciferol (VITAMIN D-3) 25 MCG (1000 UT) CAPS Take 1 capsule by mouth daily.    . clorazepate (TRANXENE) 7.5 MG tablet TAKE 1 TO 2 TABLETS BY MOUTH AT BEDTIME AS NEEDED. DX CODE G47.00 60 tablet 0  . hydrochlorothiazide (MICROZIDE) 12.5 MG capsule Held as of 01/14/20    . Multiple Vitamin (MULTIVITAMIN) tablet Take 1 tablet by mouth daily.    . NON FORMULARY Immunity support gummies Takes 1 gummy daily.    . NON FORMULARY Black elderberry gummy Takes 1 gummy qd.     . Omega-3 Fatty Acids (FISH OIL PO) Take 1 capsule by mouth daily.    Marland Kitchen omeprazole (PRILOSEC) 20 MG capsule Take 1 capsule (20 mg total) by mouth daily. 90 capsule 3  . vitamin C (ASCORBIC ACID) 500 MG tablet Take 500 mg by mouth daily.     No current facility-administered medications on file prior to visit.    Ht 5' 3.5" (1.613 m)   Wt 158 lb (71.7 kg)   BMI 27.55 kg/m    Objective:   Physical Exam Constitutional:      General: She is not in acute distress.    Appearance: Normal appearance. She is not ill-appearing.  Pulmonary:     Effort: Pulmonary effort is normal.     Comments: Deep, dry cough noted a few times during visit Neurological:     Mental Status: She is alert and oriented to person, place, and time.  Psychiatric:        Mood and Affect: Mood normal.            Assessment & Plan:

## 2020-10-10 NOTE — Patient Instructions (Signed)
Please come to the office later today for Covid-19 testing.  Start Claritin, Allegra, or Zyrtec daily for throat drainage and runny nose.  Nasal Congestion/Ear Pressure/Sinus Pressure: Try using Flonase (fluticasone) nasal spray. Instill 1 spray in each nostril twice daily.   Please update me later this week as discussed.  It was a pleasure meeting you! Allie Bossier, NP-C

## 2020-10-10 NOTE — Assessment & Plan Note (Signed)
Acute URI symptoms x 36 hours. She very well could have picked up something from family during Thanksgiving or while out shopping all weekend.  She doesn't appear sickly, she does have a deep dry cough noted.  Recommended Claritin and Flonase to start. She will also come this afternoon for Covid-19 testing. Consider chest xray if cough persists.   With 36 hours of symptoms without fevers or acute distress, I do not feel that antibiotics are warranted at this time. We discussed this and also that viral symptoms sometimes become worse before they improve.   She will update if she develops fevers along with a progressive cough.  At that point we need to rule out pneumonia.  I did ask for her to update [place] Friday this week regardless, she verbalized understanding.

## 2020-10-11 NOTE — Telephone Encounter (Signed)
Patient's husband left a voicemail requesting a call back. Called and spoke to patient and was advisd that she started with symptoms Saturday. Patient stated that she does not think that she has a fever, but does have chills and her body hurts all over. Patient stated that she has a productive cough/yellow. Patient denies SOB or difficulty breathing. Patient was given ER precautions and she verbalized understanding.

## 2020-10-12 ENCOUNTER — Telehealth: Payer: Self-pay | Admitting: *Deleted

## 2020-10-12 LAB — SPECIMEN STATUS REPORT

## 2020-10-12 LAB — NOVEL CORONAVIRUS, NAA: SARS-CoV-2, NAA: NOT DETECTED

## 2020-10-12 LAB — SARS-COV-2, NAA 2 DAY TAT

## 2020-10-12 MED ORDER — BENZONATATE 200 MG PO CAPS
200.0000 mg | ORAL_CAPSULE | Freq: Three times a day (TID) | ORAL | 0 refills | Status: DC | PRN
Start: 2020-10-12 — End: 2021-02-22

## 2020-10-12 NOTE — Telephone Encounter (Signed)
I replied to pt in her MyChart message.

## 2020-10-12 NOTE — Telephone Encounter (Signed)
Patient's husband called stating that they saw the results on mychart that covid was not detected. Patient's husband wanted to confirm that the test was negative.  Patient's husband stated that his wife has a terrible cough and wants to know if Dr. Silvio Pate has any other advice for her?

## 2020-10-12 NOTE — Telephone Encounter (Signed)
Let her know the test was negative. I sent some cough medicine (pills) to her pharmacy for her

## 2020-10-13 MED ORDER — AZITHROMYCIN 250 MG PO TABS
ORAL_TABLET | ORAL | 0 refills | Status: DC
Start: 2020-10-13 — End: 2020-11-21

## 2020-10-13 NOTE — Telephone Encounter (Signed)
Pt had virtual visit on Monday and bronchitis is no better; pt has head and chest congestion; pt has prod cough with yellow phlegm; if pt has hard coughing episode has to catch her breath. No SOB or difficulty breathing. No CP. Pt is requesting zpak to [facility]. Please seen 10/11/20 pt message in reference to abx.

## 2020-10-13 NOTE — Addendum Note (Signed)
Addended by: [name] I on: 10/13/2020 12:45 PM   Modules accepted: Orders

## 2020-10-27 ENCOUNTER — Encounter: Payer: Self-pay | Admitting: Family Medicine

## 2020-10-27 ENCOUNTER — Telehealth (INDEPENDENT_AMBULATORY_CARE_PROVIDER_SITE_OTHER): Payer: PPO | Admitting: Family Medicine

## 2020-10-27 ENCOUNTER — Other Ambulatory Visit: Payer: Self-pay | Admitting: Family Medicine

## 2020-10-27 DIAGNOSIS — J209 Acute bronchitis, unspecified: Secondary | ICD-10-CM | POA: Diagnosis not present

## 2020-10-27 MED ORDER — PREDNISONE 10 MG PO TABS: ORAL_TABLET | ORAL | 0 refills | Status: DC

## 2020-10-27 NOTE — Assessment & Plan Note (Signed)
With continued cough and some malaise s/p tx with zpak Suspect virat (is covid neg)  Given length of illness-ordered cxr at [place] -pending  Px prednisone to help with cough and wheeze Pt declined inhaler  Enc to try the tessalon she has  Also to drink fluids Update if not starting to improve in a week or if worsening

## 2020-10-27 NOTE — Patient Instructions (Signed)
Take prednisone as directed It may make you feel hyper  Take it with food Drink lots of fluids   Try the tessalon pills for cough also  Our office will call you to set up a chest xray at [facility] We will contact you with that result   If symptoms suddenly worsen please alert [place] (go to the ER if severe)

## 2020-10-27 NOTE — Progress Notes (Signed)
Virtual Visit via Telephone Note  I connected with Dominique Hall on 10/27/20 at 12:00 PM EST by telephone and verified that I am speaking with the correct person using two identifiers.  Location: Patient: home Provider: office    I discussed the limitations, risks, security and privacy concerns of performing an evaluation and management service by telephone and the availability of in person appointments. I also discussed with the patient that there may be a patient responsible charge related to this service. The patient expressed understanding and agreed to proceed.  Parties involved in encounter  [place]  Provider:  Loura Pardon MD    History of Present Illness: 85yo pf of Dr Silvio Pate presents for uri symptoms   Still has a cough  It is not deep or gravely Feels like her head is in a barrel Feels generally weak and tired /no energy  Basically does not feel good   When she coughs just a little wheeze  Sob-during a cough spell or moving fast  Cough- occ scant phlegm/yellow  (less than it was)    "I've  had bronchitis all my life"  No asthma  Used an inhaler years ago - got "addicted" to it -wants to avoid    Nasal congestion is better  No sinus pain  Throat is not sore - a little scratchy   No fever  No body aches and chills    otc Robitussin -helps some  Has not tried the tessalon yet   She had a video visit on 11/29 with Allie Bossier Did test negative for covid 19  (also vaccinated)   Taking zithromax (12/2) and teassalon The zpak made her feel better and once she finished it the symptoms started again   Family is coming in for xmas  lot of stress- husband just had surgery  Patient Active Problem List   Diagnosis Date Noted  . Acute bronchitis 10/27/2020  . Generalized osteoarthritis 05/06/2019  . Dermatitis 05/22/2018  . Left knee pain 12/14/2016  . Solitary pulmonary nodule 12/22/2015  . Cough 11/09/2015  . Advance directive  discussed with patient 03/17/2015  . Atrophic vaginitis 01/29/2014  . Cystocele, grade 1-2 01/29/2014  . Fibrocystic breast changes 12/05/2012  . Routine general medical examination at a health care facility 08/14/2011  . Mild mood disorder ([place]) 04/15/2007  . Essential hypertension, benign 04/15/2007  . ALLERGIC RHINITIS 04/15/2007  . GERD 04/15/2007  . DIVERTICULOSIS, COLON 04/15/2007  . OSTEOPENIA 04/15/2007   Past Medical History:  Diagnosis Date  . Allergy   . Arthritis   . Diverticulosis   . Esophageal dysmotility   . Esophageal ring   . GERD (gastroesophageal reflux disease)   . Hiatal hernia   . HTN (hypertension)   . IBS (irritable bowel syndrome)   . Osteopenia   . Renal disorder   . Sleep disturbance    Past Surgical History:  Procedure Laterality Date  . ABDOMINAL HYSTERECTOMY    . APPENDECTOMY  1973  . BREAST EXCISIONAL BIOPSY Right   . CHOLECYSTECTOMY    . ERCP N/A 11/22/2017   Procedure: ENDOSCOPIC RETROGRADE CHOLANGIOPANCREATOGRAPHY (ERCP);  Surgeon: Lucilla Lame, MD;  [facility];  Service: Endoscopy;  Laterality: N/A;  . ESOPHAGOGASTRODUODENOSCOPY  07-2006   with dilation  . [place]  . lumpectomy  [phone]   benign right breast  . SQUAMOUS CELL CARCINOMA EXCISION  6/12   Right leg--Dr Pat Patrick  . SQUAMOUS CELL CARCINOMA EXCISION  2012   right calf  .  TOTAL ABDOMINAL HYSTERECTOMY W/ BILATERAL SALPINGOOPHORECTOMY  1973   Social History   Tobacco Use  . Smoking status: Former Research scientist (life sciences)  . Smokeless tobacco: Never Used  Substance Use Topics  . Alcohol use: Yes    Comment: occ  . Drug use: No   Family History  Problem Relation Age of Onset  . Heart attack Father   . Stroke Mother   . Melanoma Daughter    Allergies  Allergen Reactions  . Penicillins     Has patient had a PCN reaction causing immediate rash, facial/tongue/throat swelling, SOB or lightheadedness with hypotension: Unknown Has patient had a PCN reaction causing  severe rash involving mucus membranes or skin necrosis: Unknown Has patient had a PCN reaction that required hospitalization: Unknown Has patient had a PCN reaction occurring within the last 10 years: Unknown If all of the above answers are "NO", then may proceed with Cephalosporin use.  . Rabeprazole Sodium     REACTION: constipated  . Sulfonamide Derivatives     REACTION: unspecified  . Cefuroxime     diarrhea   Current Outpatient Medications on File Prior to Visit  Medication Sig Dispense Refill  . azithromycin (ZITHROMAX Z-PAK) 250 MG tablet Take 2 tablets (500 mg) on  Day 1,  followed by 1 tablet (250 mg) once daily on Days 2 through 5. 6 each 0  . benzonatate (TESSALON) 200 MG capsule Take 1 capsule (200 mg total) by mouth 3 (three) times daily as needed for cough. 60 capsule 0  . celecoxib (CELEBREX) 200 MG capsule Take 1 capsule (200 mg total) by mouth daily. 90 capsule 3  . Cholecalciferol (VITAMIN D-3) 25 MCG (1000 UT) CAPS Take 1 capsule by mouth daily.    . clorazepate (TRANXENE) 7.5 MG tablet TAKE 1 TO 2 TABLETS BY MOUTH AT BEDTIME AS NEEDED. DX CODE G47.00 60 tablet 0  . hydrochlorothiazide (MICROZIDE) 12.5 MG capsule Held as of 01/14/20    . Multiple Vitamin (MULTIVITAMIN) tablet Take 1 tablet by mouth daily.    . NON FORMULARY Immunity support gummies Takes 1 gummy daily.    . NON FORMULARY Black elderberry gummy Takes 1 gummy qd.    . Omega-3 Fatty Acids (FISH OIL PO) Take 1 capsule by mouth daily.    Marland Kitchen omeprazole (PRILOSEC) 20 MG capsule Take 1 capsule (20 mg total) by mouth daily. 90 capsule 3  . vitamin C (ASCORBIC ACID) 500 MG tablet Take 500 mg by mouth daily.     No current facility-administered medications on file prior to visit.   Review of Systems  Constitutional: Positive for malaise/fatigue. Negative for chills and fever.  HENT: Negative for congestion, ear pain, sinus pain and sore throat.   Eyes: Negative for blurred vision, discharge and redness.   Respiratory: Positive for cough, sputum production and wheezing. Negative for shortness of breath and stridor.   Cardiovascular: Negative for chest pain, palpitations and leg swelling.  Gastrointestinal: Negative for abdominal pain, diarrhea, nausea and vomiting.  Musculoskeletal: Negative for myalgias.  Skin: Negative for rash.  Neurological: Negative for dizziness and headaches.    Observations/Objective: Pt sounds well/not distressed Not hoarse occ dry cough noted  Unable to hear wheezing or sob in conversation Nl mood , pleasant Good historian /nl cognition   Assessment and Plan: Problem List Items Addressed This Visit      Respiratory   Acute bronchitis    With continued cough and some malaise s/p tx with zpak Suspect virat (is covid neg)  Given length of illness-ordered cxr at [facility] -pending  Px prednisone to help with cough and wheeze Pt declined inhaler  Enc to try the tessalon she has  Also to drink fluids Update if not starting to improve in a week or if worsening            Follow Up Instructions: Take prednisone as directed It may make you feel hyper  Take it with food Drink lots of fluids   Try the tessalon pills for cough also  Our office will call you to set up a chest xray at [facility] We will contact you with that result   If symptoms suddenly worsen please alert [place] (go to the ER if severe)    I discussed the assessment and treatment plan with the patient. The patient was provided an opportunity to ask questions and all were answered. The patient agreed with the plan and demonstrated an understanding of the instructions.   The patient was advised to call back or seek an in-person evaluation if the symptoms worsen or if the condition fails to improve as anticipated.  I provided 16 minutes of non-face-to-face time during this encounter.   Loura Pardon, MD

## 2020-11-20 NOTE — Progress Notes (Signed)
Date:  11/21/2020   ID:  Dominique Hall, DOB [birth year], MRN [id number]  Patient Location:  [place] [address]   Provider location:   [facility], [place] office  PCP:  Venia Carbon, MD  Cardiologist:  Patsy Baltimore  Chief Complaint  Patient presents with   Other    12 month follow up. Meds reviewed verbally with patient.     History of Present Illness:    Dominique Hall is a 86 y.o. female  past medical history of  chest pain.  palpitations  not diabetic and not a smoker. tachycardia in 2005 ,hospitalized at [facility] with negative work-up.   Who presents for routine follow-up of her chest pain episodes  Last seen in clinic March 2021 Was having short episodes of lightheadedness, typically more with standing, feeling like she was going to faint Denies palpitations, otherwise active Felt better after HCTZ held  Having episodes where food is in a ball, upper epigastric area Sits, deep breaths, goes away None in long time, months Better with clorazepate  Denies any significant chest pain  She put herself back on HCTZ Denies any orthostasis symptoms since she is back on HCTZ, reports her blood pressure is well controlled Would like to stay on HCTZ, asking for refill  EKG personally reviewed by myself on todays visit Shows normal sinus rhythm with rate 77 bpm no significant ST-T wave changes  Other past medical history reviewed Prior echocardiogram and stress test No ischemia, normal ejection fraction    Prior CV studies:   The following studies were reviewed today:  Stress test 07/2019 Pharmacological myocardial perfusion imaging study with no significant  ischemia Grossly Normal wall motion, Unable to estimate EF secondary to GI uptake artifact No EKG changes concerning for ischemia at peak stress or in recovery. Low risk scan   Echo  07/2019  1. Left ventricular ejection fraction, by visual estimation, is 60  to 65%. The left ventricle has normal function. Normal left ventricular size. There is no left ventricular hypertrophy.  2. Left ventricular diastolic Doppler parameters are consistent with impaired relaxation pattern of LV diastolic filling.  3. Global right ventricle has normal systolic function.The right ventricular size is normal. No increase in right ventricular wall thickness.  4. Mild to moderately elevated pulmonary artery systolic pressure 42 mm Hg.  5. Left atrial size was mildly dilated.  6. Right atrial size was normal.    Past Medical History:  Diagnosis Date   Allergy    Arthritis    Diverticulosis    Esophageal dysmotility    Esophageal ring    GERD (gastroesophageal reflux disease)    Hiatal hernia    HTN (hypertension)    IBS (irritable bowel syndrome)    Osteopenia    Renal disorder    Sleep disturbance    Past Surgical History:  Procedure Laterality Date   ABDOMINAL HYSTERECTOMY     APPENDECTOMY  1973   BREAST EXCISIONAL BIOPSY Right    CHOLECYSTECTOMY     ERCP N/A 11/22/2017   Procedure: ENDOSCOPIC RETROGRADE CHOLANGIOPANCREATOGRAPHY (ERCP);  Surgeon: Lucilla Lame, MD;  Location: [facility] ENDOSCOPY;  Service: Endoscopy;  Laterality: N/A;   ESOPHAGOGASTRODUODENOSCOPY  07-2006   with dilation   KIDNEY SURGERY  1970   lumpectomy  02-1991   benign right breast   SQUAMOUS CELL CARCINOMA EXCISION  6/12   Right leg--Dr Crayne CARCINOMA EXCISION  2012  right calf   TOTAL ABDOMINAL HYSTERECTOMY W/ BILATERAL SALPINGOOPHORECTOMY  1973     Allergies:   Penicillins, Rabeprazole sodium, Sulfonamide derivatives, and Cefuroxime   Social History   Tobacco Use   Smoking status: Former Smoker   Smokeless tobacco: Never Used  Substance Use Topics   Alcohol use: Yes    Comment: occ   Drug use: No     Current Outpatient Medications on File Prior to Visit  Medication Sig Dispense Refill   benzonatate (TESSALON) 200 MG capsule  Take 1 capsule (200 mg total) by mouth 3 (three) times daily as needed for cough. 60 capsule 0   celecoxib (CELEBREX) 200 MG capsule Take 1 capsule (200 mg total) by mouth daily. 90 capsule 3   Cholecalciferol (VITAMIN D-3) 25 MCG (1000 UT) CAPS Take 1 capsule by mouth daily.     clorazepate (TRANXENE) 7.5 MG tablet TAKE 1 TO 2 TABLETS BY MOUTH AT BEDTIME AS NEEDED. DX CODE G47.00 60 tablet 0   hydrochlorothiazide (MICROZIDE) 12.5 MG capsule Held as of 01/14/20     Multiple Vitamin (MULTIVITAMIN) tablet Take 1 tablet by mouth daily.     NON FORMULARY Immunity support gummies Takes 1 gummy daily.     NON FORMULARY Black elderberry gummy Takes 1 gummy qd.     Omega-3 Fatty Acids (FISH OIL PO) Take 1 capsule by mouth daily.     omeprazole (PRILOSEC) 20 MG capsule Take 1 capsule (20 mg total) by mouth daily. 90 capsule 3   vitamin C (ASCORBIC ACID) 500 MG tablet Take 500 mg by mouth daily.     No current facility-administered medications on file prior to visit.     Family Hx: The patient's family history includes Heart attack in her father; Melanoma in her daughter; Stroke in her mother.  ROS:   Please see the history of present illness.    Review of Systems  Constitutional: Negative.   HENT: Negative.   Respiratory: Negative.   Cardiovascular: Negative.   Gastrointestinal: Negative.   Musculoskeletal: Negative.   Neurological: Negative.   Psychiatric/Behavioral: Negative.   All other systems reviewed and are negative.    Labs/Other Tests and Data Reviewed:    Recent Labs: 01/14/2020: BUN 22; Creatinine, Ser 0.78; Hemoglobin 12.8; Platelets 166.0; Potassium 3.8; Sodium 140; TSH 1.15   Recent Lipid Panel Lab Results  Component Value Date/Time   CHOL 152 02/22/2014 04:12 PM   TRIG 98.0 02/22/2014 04:12 PM   HDL 63.00 02/22/2014 04:12 PM   CHOLHDL 2 02/22/2014 04:12 PM   LDLCALC 69 02/22/2014 04:12 PM    Wt Readings from Last 3 Encounters:  11/21/20 161 lb (73 kg)   10/10/20 158 lb (71.7 kg)  05/09/20 158 lb (71.7 kg)    Exam:    Vital Signs: Vital signs may also be detailed in the HPI BP 132/72 (BP Location: Left Arm, Patient Position: Sitting, Cuff Size: Normal)    Pulse 77    Ht 5\' 5"  (1.651 m)    Wt 161 lb (73 kg)    BMI 26.79 kg/m    Constitutional:  oriented to person, place, and time. No distress.  HENT:  Head: Grossly normal Eyes:  no discharge. No scleral icterus.  Neck: No JVD, no carotid bruits  Cardiovascular: Regular rate and rhythm, no murmurs appreciated Pulmonary/Chest: Clear to auscultation bilaterally, no wheezes or rails Abdominal: Soft.  no distension.  no tenderness.  Musculoskeletal: Normal range of motion Neurological:  normal muscle tone. Coordination normal. No  atrophy Skin: Skin warm and dry Psychiatric: normal affect, pleasant   ASSESSMENT & PLAN:    Problem List Items Addressed This Visit   None   Visit Diagnoses    Chest pain, unspecified type    -  Primary   Relevant Orders   EKG 12-Lead   SOB (shortness of breath)       Relevant Orders   EKG 12-Lead   Essential hypertension       Anxiety       Lightheadedness         Chest pain: Denies chest pain, no further work-up at this time  Shortness of breath Reports breathing is stable, Recommend regular walking program  Dizziness HCTZ previously held for orthostasis, she has put herself back on the medication, refill provided at her request Recommend she hold HCTZ for dizziness symptoms  Essential hypertension HCTZ previously held for orthostasis symptoms She is back on, blood pressure fine Will monitor for now     Total encounter time more than 25 minutes  Greater than 50% was spent in counseling and coordination of care with the patient   Signed, Ida Rogue, MD  [place] [facility] [address], [place], [place] [address]

## 2020-11-21 ENCOUNTER — Encounter: Payer: Self-pay | Admitting: Cardiovascular Disease

## 2020-11-21 ENCOUNTER — Ambulatory Visit (INDEPENDENT_AMBULATORY_CARE_PROVIDER_SITE_OTHER): Payer: Medicare Other | Admitting: Cardiovascular Disease

## 2020-11-21 ENCOUNTER — Other Ambulatory Visit: Payer: Self-pay

## 2020-11-21 VITALS — BP 132/72 | HR 77 | Ht 65.0 in | Wt 161.0 lb

## 2020-11-21 DIAGNOSIS — R079 Chest pain, unspecified: Secondary | ICD-10-CM

## 2020-11-21 DIAGNOSIS — F419 Anxiety disorder, unspecified: Secondary | ICD-10-CM

## 2020-11-21 DIAGNOSIS — I10 Essential (primary) hypertension: Secondary | ICD-10-CM

## 2020-11-21 DIAGNOSIS — Z23 Encounter for immunization: Secondary | ICD-10-CM | POA: Diagnosis not present

## 2020-11-21 DIAGNOSIS — R42 Dizziness and giddiness: Secondary | ICD-10-CM

## 2020-11-21 DIAGNOSIS — R0602 Shortness of breath: Secondary | ICD-10-CM

## 2020-11-21 MED ORDER — HYDROCHLOROTHIAZIDE 12.5 MG PO CAPS: 12.5000 mg | ORAL_CAPSULE | Freq: Every day | ORAL | 3 refills | Status: DC

## 2020-11-21 NOTE — Patient Instructions (Addendum)
You received your yearly Flu immigration shot today 11/21/2020 (Quad 65+vaccine)   Medication Instructions:  Keep taking your current medications   Lab work: None  Testing/Procedures: No new testing needed   Follow-Up: At [facility], you and your health needs are our priority.  As part of our continuing mission to provide you with exceptional heart care, we have created designated Provider Care Teams.  These Care Teams include your primary Cardiologist (physician) and Advanced Practice Providers (APPs -  Physician Assistants and Nurse Practitioners) who all work together to provide you with the care you need, when you need it.  . You will need a follow up appointment in 12 months  . Providers on your designated Care Team:   . Murray Hodgkins, NP . Christell Faith, PA-C . Marrianne Mood, PA-C  Any Other Special Instructions Will Be Listed Below (If Applicable).  COVID-19 Vaccine Information can be found at: ShippingScam.co.uk For questions related to vaccine distribution or appointments, please email vaccine@[email or web address] or call 7173979834.   Influenza (Flu) Vaccine (Inactivated or Recombinant): What You Need to Know 1. Why get vaccinated? Influenza vaccine can prevent influenza (flu). Flu is a contagious disease that spreads around the [place] every year, usually between October and May. Anyone can get the flu, but it is more dangerous for some people. Infants and young children, people 91 years and older, pregnant people, and people with certain health conditions or a weakened immune system are at greatest risk of flu complications. Pneumonia, bronchitis, sinus infections, and ear infections are examples of flu-related complications. If you have a medical condition, such as heart disease, cancer, or diabetes, flu can make it worse. Flu can cause fever and chills, sore throat, muscle aches, fatigue, cough,  headache, and runny or stuffy nose. Some people may have vomiting and diarrhea, though this is more common in children than adults. In an average year, thousands of people in the [place] die from flu, and many more are hospitalized. Flu vaccine prevents millions of illnesses and flu-related visits to the doctor each year. 2. Influenza vaccines CDC recommends everyone 6 months and older get vaccinated every flu season. Children 6 months through 10 years of age may need 2 doses during a single flu season. Everyone else needs only 1 dose each flu season. It takes about 2 weeks for protection to develop after vaccination. There are many flu viruses, and they are always changing. Each year a new flu vaccine is made to protect against the influenza viruses believed to be likely to cause disease in the upcoming flu season. Even when the vaccine doesn't exactly match these viruses, it may still provide some protection. Influenza vaccine does not cause flu. Influenza vaccine may be given at the same time as other vaccines. 3. Talk with your health care provider Tell your vaccination provider if the person getting the vaccine:  Has had an allergic reaction after a previous dose of influenza vaccine, or has any severe, life-threatening allergies  Has ever had Guillain-Barr Syndrome (also called "GBS") In some cases, your health care provider may decide to postpone influenza vaccination until a future visit. Influenza vaccine can be administered at any time during pregnancy. People who are or will be pregnant during influenza season should receive inactivated influenza vaccine. People with minor illnesses, such as a cold, may be vaccinated. People who are moderately or severely ill should usually wait until they recover before getting influenza vaccine. Your health care provider can give you more information. 4. Risks  of a vaccine reaction  Soreness, redness, and swelling where the shot is given,  fever, muscle aches, and headache can happen after influenza vaccination.  There may be a very small increased risk of Guillain-Barr Syndrome (GBS) after inactivated influenza vaccine (the flu shot). Young children who get the flu shot along with pneumococcal vaccine (PCV13) and/or DTaP vaccine at the same time might be slightly more likely to have a seizure caused by fever. Tell your health care provider if a child who is getting flu vaccine has ever had a seizure. People sometimes faint after medical procedures, including vaccination. Tell your provider if you feel dizzy or have vision changes or ringing in the ears. As with any medicine, there is a very remote chance of a vaccine causing a severe allergic reaction, other serious injury, or death. 5. What if there is a serious problem? An allergic reaction could occur after the vaccinated person leaves the clinic. If you see signs of a severe allergic reaction (hives, swelling of the face and throat, difficulty breathing, a fast heartbeat, dizziness, or weakness), call 9-1-1 and get the person to the nearest hospital. For other signs that concern you, call your health care provider. Adverse reactions should be reported to the Vaccine Adverse Event Reporting System (VAERS). Your health care provider will usually file this report, or you can do it yourself. Visit the VAERS website at www.vaers.SamedayNews.es or call 904-037-1058. VAERS is only for reporting reactions, and VAERS staff members do not give medical advice. 6. The National Vaccine Injury Compensation Program The [employer] Vaccine Injury Compensation Program (VICP) is a federal program that was created to compensate people who may have been injured by certain vaccines. Claims regarding alleged injury or death due to vaccination have a time limit for filing, which may be as short as two years. Visit the VICP website at GoldCloset.com.ee or call (828)617-0275 to learn about the program  and about filing a claim. 7. How can I learn more?  Ask your health care provider.  Call your local or state health department.  Visit the website of the Food and Drug Administration (FDA) for vaccine package inserts and additional information at TraderRating.uy.  Contact the Centers for Disease Control and Prevention (CDC): ? Call (302)018-8171 (1-800-CDC-INFO) or ? Visit CDC's website at https://gibson.com/. Vaccine Information Statement Inactivated Influenza Vaccine (06/17/2020) This information is not intended to replace advice given to you by your health care provider. Make sure you discuss any questions you have with your health care provider. Document Revised: 08/04/2020 Document Reviewed: 08/04/2020 Elsevier Patient Education  2021 [employer].

## 2020-11-22 ENCOUNTER — Other Ambulatory Visit: Payer: Self-pay | Admitting: Internal Medicine

## 2020-11-22 DIAGNOSIS — G4700 Insomnia, unspecified: Secondary | ICD-10-CM

## 2020-11-22 NOTE — Telephone Encounter (Signed)
Last filled 08-25-20 #60 Last OV 05-09-20 Next OV 05-11-21 [facility]

## 2020-12-01 ENCOUNTER — Telehealth: Payer: Self-pay

## 2020-12-01 DIAGNOSIS — G4700 Insomnia, unspecified: Secondary | ICD-10-CM

## 2020-12-01 MED ORDER — CELECOXIB 200 MG PO CAPS: 200.0000 mg | ORAL_CAPSULE | Freq: Every day | ORAL | 3 refills | Status: DC

## 2020-12-01 MED ORDER — OMEPRAZOLE 20 MG PO CPDR: 20.0000 mg | DELAYED_RELEASE_CAPSULE | Freq: Every day | ORAL | 3 refills | Status: DC

## 2020-12-01 MED ORDER — HYDROCHLOROTHIAZIDE 12.5 MG PO CAPS: 12.5000 mg | ORAL_CAPSULE | Freq: Every day | ORAL | 3 refills | Status: DC

## 2020-12-01 MED ORDER — CLORAZEPATE DIPOTASSIUM 7.5 MG PO TABS: ORAL_TABLET | ORAL | 0 refills | Status: DC

## 2020-12-01 NOTE — Telephone Encounter (Signed)
She did fill one on the 12th of January--so she shouldn't need another Rx for a month and a half or so

## 2020-12-01 NOTE — Telephone Encounter (Signed)
Spoke to pt's husband.

## 2020-12-01 NOTE — Telephone Encounter (Signed)
[facility]. Needs new rx sent to [facility]

## 2020-12-04 ENCOUNTER — Other Ambulatory Visit: Payer: Self-pay | Admitting: Internal Medicine

## 2020-12-04 DIAGNOSIS — G4700 Insomnia, unspecified: Secondary | ICD-10-CM

## 2020-12-07 ENCOUNTER — Other Ambulatory Visit: Payer: Self-pay | Admitting: Internal Medicine

## 2020-12-07 DIAGNOSIS — G4700 Insomnia, unspecified: Secondary | ICD-10-CM

## 2020-12-08 ENCOUNTER — Telehealth: Payer: Self-pay | Admitting: Internal Medicine

## 2020-12-08 DIAGNOSIS — H9193 Unspecified hearing loss, bilateral: Secondary | ICD-10-CM

## 2020-12-08 NOTE — Telephone Encounter (Signed)
Referral placed.

## 2020-12-08 NOTE — Telephone Encounter (Signed)
Pt husband called and stated that she needed a referral for hearing exam, and they are going to [facility] - Dr. Stephanie Coup

## 2021-01-18 ENCOUNTER — Other Ambulatory Visit: Payer: Self-pay | Admitting: Internal Medicine

## 2021-01-18 DIAGNOSIS — G4700 Insomnia, unspecified: Secondary | ICD-10-CM

## 2021-01-18 DIAGNOSIS — Z1231 Encounter for screening mammogram for malignant neoplasm of breast: Secondary | ICD-10-CM

## 2021-01-18 MED ORDER — CLORAZEPATE DIPOTASSIUM 7.5 MG PO TABS: ORAL_TABLET | ORAL | 0 refills | Status: DC

## 2021-01-18 NOTE — Telephone Encounter (Signed)
Pharmacy requests refill on: Clorazepate 7.5 mg   LAST REFILL: 12/01/2020 (Q-60, R-0) LAST OV: 05/09/2020 NEXT OV: 05/11/2021 PHARMACY: [facility] [address] [place], [place]

## 2021-02-22 ENCOUNTER — Other Ambulatory Visit: Payer: Self-pay

## 2021-02-22 ENCOUNTER — Encounter: Payer: Self-pay | Admitting: Primary Care

## 2021-02-22 ENCOUNTER — Ambulatory Visit (INDEPENDENT_AMBULATORY_CARE_PROVIDER_SITE_OTHER): Payer: 59 | Admitting: Primary Care

## 2021-02-22 DIAGNOSIS — R42 Dizziness and giddiness: Secondary | ICD-10-CM

## 2021-02-22 DIAGNOSIS — I10 Essential (primary) hypertension: Secondary | ICD-10-CM

## 2021-02-22 LAB — BASIC METABOLIC PANEL
BUN: 25 mg/dL — ABNORMAL HIGH (ref 6–23)
CO2: 30 mEq/L (ref 19–32)
Calcium: 9.6 mg/dL (ref 8.4–10.5)
Chloride: 105 mEq/L (ref 96–112)
Creatinine, Ser: 0.82 mg/dL (ref 0.40–1.20)
GFR: 65.91 mL/min (ref >60.00)
Glucose, Bld: 78 mg/dL (ref 70–99)
Potassium: 4 mEq/L (ref 3.5–5.1)
Sodium: 142 mEq/L (ref 135–145)

## 2021-02-22 LAB — CBC
HCT: 36.8 % (ref 36.0–46.0)
Hemoglobin: 12.7 g/dL (ref 12.0–15.0)
MCHC: 34.5 g/dL (ref 30.0–36.0)
MCV: 84.6 fl (ref 78.0–100.0)
Platelets: 161 10*3/uL (ref 150.0–400.0)
RBC: 4.35 Mil/uL (ref 3.87–5.11)
RDW: 12.9 % (ref 11.5–15.5)
WBC: 6.2 10*3/uL (ref 4.0–10.5)

## 2021-02-22 NOTE — Assessment & Plan Note (Addendum)
Controlled at initial glance but she was orthostatic today when changing positions from laying to standing with increase in HR.  Suspect HCTZ 12.5 mg is contributing to symptoms of lightheadedness. Recommended she stop this medication for now, cardiology recommended this in January 2022.  Also recommended she monitor BP and notify if she sees readings consistently at or above 140/90. BMP and CBC pending.   If BP increases then would recommend better agent than diuretic for this patient, especially given no water intake.

## 2021-02-22 NOTE — Progress Notes (Signed)
Subjective:    Patient ID: Dominique Hall, female    DOB: 1934/12/07, 86 y.o.   MRN: [id number]  HPI  Dominique Hall is a very pleasant 86 y.o. female patient of Dr. Silvio Pate with a history of hypertension, palpitations, chest pain, dizziness, mood disorder who presents today with a chief complaint of tachycardia and dizziness.  Follows with cardiology, last visit in January 2022 for routine follow up for chest pain, lightheadedness, near syncope. During this visit she decided to remain on HCTZ which was previously held in the past for dizziness and orthostasis.   Today she endorses a long history of palpitations and dizziness "spells", intermittent, lasting 2-5 min. This morning she felt lightheaded while wiping down the countertop so she went to rest for a few minutes and symptoms abated. Symptoms typically occur when active or rising from a seated position.   She is taking HCTZ 12.5 mg daily. She does not check her BP daily, but she checked her BP this morning three times back-to-back which ranged 120-150's/70-80's. She endorses drinking no water daily, drinks mostly Diet Dr Malachi Bonds, sweet tea. She has a love for sugar.  She has no symptoms when she takes her clorazapate.   BP Readings from Last 3 Encounters:  02/22/21 126/74  11/21/20 132/72  05/09/20 118/74     Review of Systems  Constitutional: Negative for fatigue and fever.  Eyes: Negative for visual disturbance.  Respiratory: Negative for shortness of breath.   Cardiovascular: Positive for palpitations. Negative for chest pain.  Neurological: Positive for light-headedness.         Past Medical History:  Diagnosis Date  . Allergy   . Arthritis   . Diverticulosis   . Esophageal dysmotility   . Esophageal ring   . GERD (gastroesophageal reflux disease)   . Hiatal hernia   . HTN (hypertension)   . IBS (irritable bowel syndrome)   . Osteopenia   . Renal disorder   . Sleep disturbance     Social History    Socioeconomic History  . Marital status: Married    Spouse name: Not on file  . Number of children: 2  . Years of education: Not on file  . Highest education level: Not on file  Occupational History  . Occupation: Lawyer  Tobacco Use  . Smoking status: Former Research scientist (life sciences)  . Smokeless tobacco: Never Used  Substance and Sexual Activity  . Alcohol use: Yes    Comment: occ  . Drug use: No  . Sexual activity: Not on file  Other Topics Concern  . Not on file  Social History Narrative   Has living will   Husband is [place] health care POA---alternate is daughters Lynne/Amanda   Would accept resuscitation but no prolonged ventilation   Probably would not want tube feedings if cognitively unaware   Social Determinants of Health   Financial Resource Strain: Not on file  Food Insecurity: Not on file  Transportation Needs: Not on file  Physical Activity: Not on file  Stress: Not on file  Social Connections: Not on file  Intimate Partner Violence: Not on file    Past Surgical History:  Procedure Laterality Date  . ABDOMINAL HYSTERECTOMY    . APPENDECTOMY  1973  . BREAST EXCISIONAL BIOPSY Right   . CHOLECYSTECTOMY    . ERCP N/A 11/22/2017   Procedure: ENDOSCOPIC RETROGRADE CHOLANGIOPANCREATOGRAPHY (ERCP);  Surgeon: Lucilla Lame, MD;  Location: [facility] ENDOSCOPY;  Service: Endoscopy;  Laterality: N/A;  . ESOPHAGOGASTRODUODENOSCOPY  07-2006  with dilation  . [place]  . lumpectomy  [phone]   benign right breast  . SQUAMOUS CELL CARCINOMA EXCISION  6/12   Right leg--Dr Pat Patrick  . SQUAMOUS CELL CARCINOMA EXCISION  2012   right calf  . TOTAL ABDOMINAL HYSTERECTOMY W/ BILATERAL SALPINGOOPHORECTOMY  1973    Family History  Problem Relation Age of Onset  . Heart attack Father   . Stroke Mother   . Melanoma Daughter     Allergies  Allergen Reactions  . Penicillins     Has patient had a PCN reaction causing immediate rash, facial/tongue/throat swelling, SOB or  lightheadedness with hypotension: Unknown Has patient had a PCN reaction causing severe rash involving mucus membranes or skin necrosis: Unknown Has patient had a PCN reaction that required hospitalization: Unknown Has patient had a PCN reaction occurring within the last 10 years: Unknown If all of the above answers are "NO", then may proceed with Cephalosporin use.  . Rabeprazole Sodium     REACTION: constipated  . Sulfonamide Derivatives     REACTION: unspecified  . Cefuroxime     diarrhea    Current Outpatient Medications on File Prior to Visit  Medication Sig Dispense Refill  . celecoxib (CELEBREX) 200 MG capsule Take 1 capsule (200 mg total) by mouth daily. 90 capsule 3  . Cholecalciferol (VITAMIN D-3) 25 MCG (1000 UT) CAPS Take 1 capsule by mouth daily.    . clorazepate (TRANXENE) 7.5 MG tablet TAKE 1 TO 2 TABLETS BY MOUTH AT BEDTIME AS NEEDED. DX CODE G47.00 60 tablet 0  . hydrochlorothiazide (MICROZIDE) 12.5 MG capsule Take 1 capsule (12.5 mg total) by mouth daily. 90 capsule 3  . Multiple Vitamin (MULTIVITAMIN) tablet Take 1 tablet by mouth daily.    . NON FORMULARY Immunity support gummies Takes 1 gummy daily.    . NON FORMULARY Black elderberry gummy Takes 1 gummy qd.    . Omega-3 Fatty Acids (FISH OIL PO) Take 1 capsule by mouth daily.    Marland Kitchen omeprazole (PRILOSEC) 20 MG capsule Take 1 capsule (20 mg total) by mouth daily. 90 capsule 3  . vitamin C (ASCORBIC ACID) 500 MG tablet Take 500 mg by mouth daily.     No current facility-administered medications on file prior to visit.    BP 126/74   Pulse 81   Temp (!) 97.3 F (36.3 C) (Temporal)   Ht 5\' 5"  (1.651 m)   Wt 160 lb (72.6 kg)   SpO2 96%   BMI 26.63 kg/m  Objective:   Physical Exam Constitutional:      Appearance: She is not ill-appearing.  Cardiovascular:     Rate and Rhythm: Normal rate and regular rhythm.  Pulmonary:     Effort: Pulmonary effort is normal.     Breath sounds: Normal breath sounds.   Musculoskeletal:     Cervical back: Neck supple.  Skin:    General: Skin is warm and dry.  Neurological:     Mental Status: She is alert and oriented to person, place, and time.           Assessment & Plan:      This visit occurred during the SARS-CoV-2 public health emergency.  Safety protocols were in place, including screening questions prior to the visit, additional usage of staff PPE, and extensive cleaning of exam room while observing appropriate contact time as indicated for disinfecting solutions.

## 2021-02-22 NOTE — Patient Instructions (Addendum)
Stop taking your hydrochlorothiazide 12.5 mg medication for blood pressure.  Stop by the lab prior to leaving today. I will notify you of your results once received.   Increase your water intake to at LEAST 2 bottles daily.  Please notify us if you start to see blood pressure readings consistently at or above 140/90.  It was a pleasure meeting you!

## 2021-02-22 NOTE — Assessment & Plan Note (Signed)
Intermittent, chronic for years.  Suspect a combination of causes including HCTZ 12.5 mg, dehydration, and poor diet.   Hold HCTZ 12.5 mg for now. She will monitor BP. Checking labs including CBC and BMP.  Encouraged to drink at least 2 bottles of water daily, work up from there.  Consider referral back to cards for Holter monitor if symptoms persist. Heart rate and rhythm regular today.

## 2021-04-13 ENCOUNTER — Ambulatory Visit (INDEPENDENT_AMBULATORY_CARE_PROVIDER_SITE_OTHER): Payer: 59 | Admitting: Internal Medicine

## 2021-04-13 ENCOUNTER — Encounter: Payer: Self-pay | Admitting: Internal Medicine

## 2021-04-13 ENCOUNTER — Ambulatory Visit (INDEPENDENT_AMBULATORY_CARE_PROVIDER_SITE_OTHER)
Admission: RE | Admit: 2021-04-13 | Discharge: 2021-04-13 | Disposition: A | Discharge status: Home / Self Care | Payer: 59 | Source: Ambulatory Visit | Attending: Internal Medicine | Admitting: Internal Medicine

## 2021-04-13 ENCOUNTER — Telehealth: Payer: Self-pay

## 2021-04-13 ENCOUNTER — Other Ambulatory Visit: Payer: Self-pay

## 2021-04-13 VITALS — BP 124/80 | HR 75 | Temp 97.5°F | Ht 65.0 in | Wt 161.0 lb

## 2021-04-13 DIAGNOSIS — R4189 Other symptoms and signs involving cognitive functions and awareness: Secondary | ICD-10-CM | POA: Insufficient documentation

## 2021-04-13 DIAGNOSIS — R0600 Dyspnea, unspecified: Secondary | ICD-10-CM | POA: Diagnosis not present

## 2021-04-13 DIAGNOSIS — R0609 Other forms of dyspnea: Secondary | ICD-10-CM

## 2021-04-13 DIAGNOSIS — G4700 Insomnia, unspecified: Secondary | ICD-10-CM

## 2021-04-13 DIAGNOSIS — R002 Palpitations: Secondary | ICD-10-CM | POA: Diagnosis not present

## 2021-04-13 LAB — POC URINALSYSI DIPSTICK (AUTOMATED)
Bilirubin, UA: NEGATIVE
Glucose, UA: NEGATIVE
Ketones, UA: NEGATIVE
Nitrite, UA: NEGATIVE
Protein, UA: NEGATIVE
Spec Grav, UA: 1.02 (ref 1.010–1.025)
Urobilinogen, UA: 0.2 E.U./dL
pH, UA: 6 (ref 5.0–8.0)

## 2021-04-13 MED ORDER — CLORAZEPATE DIPOTASSIUM 7.5 MG PO TABS: ORAL_TABLET | ORAL | 0 refills | Status: DC

## 2021-04-13 NOTE — Assessment & Plan Note (Addendum)
Worrisome for cardiac cause CXR doesn't show CHF or infection--will await radiology reading EKG--sinus at 77. Normal axis and intervals. No ischemia or hypertrophy. No real change since 1/22  Worrisome for new ischemia or mild CHF (not overt) Had work up in 2020--will ask Dr Rockey Situ to see her again

## 2021-04-13 NOTE — Progress Notes (Signed)
Subjective:    Patient ID: Dominique Hall, female    DOB: 26-Mar-1935, 86 y.o.   MRN: [id number]  HPI Here due to shortness of breath, cognitive changes, etc With husband This visit occurred during the SARS-CoV-2 public health emergency.  Safety protocols were in place, including screening questions prior to the visit, additional usage of staff PPE, and extensive cleaning of exam room while observing appropriate contact time as indicated for disinfecting solutions.   Feels shortness of breath in upper chest Will come on with brief walking on level ground No problems with sitting or in bed This started about 3 months ago--when she started doing more walking No chest pain Does get sense of racing heart at times (not overly fast) Some edema  Sleeps flat No PND  Husband has noticed a change in personality in the past 6 months Seems to complain of pain a lot Now kind of sluggish Has been taking tylenol PM every night  No dysuria, urgency or hematuria  Current Outpatient Medications on File Prior to Visit  Medication Sig Dispense Refill  . celecoxib (CELEBREX) 200 MG capsule Take 1 capsule (200 mg total) by mouth daily. 90 capsule 3  . NON FORMULARY Immunity support gummies Takes 1 gummy daily.    . NON FORMULARY Black elderberry gummy Takes 1 gummy qd.    . Omega-3 Fatty Acids (FISH OIL PO) Take 1 capsule by mouth daily.    Marland Kitchen omeprazole (PRILOSEC) 20 MG capsule Take 1 capsule (20 mg total) by mouth daily. 90 capsule 3  . vitamin C (ASCORBIC ACID) 500 MG tablet Take 500 mg by mouth daily.    . Cholecalciferol (VITAMIN D-3) 25 MCG (1000 UT) CAPS Take 1 capsule by mouth daily. (Patient not taking: Reported on 04/13/2021)     No current facility-administered medications on file prior to visit.    Allergies  Allergen Reactions  . Penicillins     Has patient had a PCN reaction causing immediate rash, facial/tongue/throat swelling, SOB or lightheadedness with hypotension:  Unknown Has patient had a PCN reaction causing severe rash involving mucus membranes or skin necrosis: Unknown Has patient had a PCN reaction that required hospitalization: Unknown Has patient had a PCN reaction occurring within the last 10 years: Unknown If all of the above answers are "NO", then may proceed with Cephalosporin use.  . Rabeprazole Sodium     REACTION: constipated  . Sulfonamide Derivatives     REACTION: unspecified  . Cefuroxime     diarrhea    Past Medical History:  Diagnosis Date  . Allergy   . Arthritis   . Diverticulosis   . Esophageal dysmotility   . Esophageal ring   . GERD (gastroesophageal reflux disease)   . Hiatal hernia   . HTN (hypertension)   . IBS (irritable bowel syndrome)   . Osteopenia   . Renal disorder   . Sleep disturbance     Past Surgical History:  Procedure Laterality Date  . ABDOMINAL HYSTERECTOMY    . APPENDECTOMY  1973  . BREAST EXCISIONAL BIOPSY Right   . CHOLECYSTECTOMY    . ERCP N/A 11/22/2017   Procedure: ENDOSCOPIC RETROGRADE CHOLANGIOPANCREATOGRAPHY (ERCP);  Surgeon: Lucilla Lame, MD;  Location: [facility] ENDOSCOPY;  Service: Endoscopy;  Laterality: N/A;  . ESOPHAGOGASTRODUODENOSCOPY  07-2006   with dilation  . [place]  . lumpectomy  [phone]   benign right breast  . SQUAMOUS CELL CARCINOMA EXCISION  6/12   Right leg--Dr Pat Patrick  .  SQUAMOUS CELL CARCINOMA EXCISION  2012   right calf  . TOTAL ABDOMINAL HYSTERECTOMY W/ BILATERAL SALPINGOOPHORECTOMY  1973    Family History  Problem Relation Age of Onset  . Heart attack Father   . Stroke Mother   . Melanoma Daughter     Social History   Socioeconomic History  . Marital status: Married    Spouse name: Not on file  . Number of children: 2  . Years of education: Not on file  . Highest education level: Not on file  Occupational History  . Occupation: Lawyer  Tobacco Use  . Smoking status: Former Research scientist (life sciences)  . Smokeless tobacco: Never Used  Substance  and Sexual Activity  . Alcohol use: Yes    Comment: occ  . Drug use: No  . Sexual activity: Not on file  Other Topics Concern  . Not on file  Social History Narrative   Has living will   Husband is [place] health care POA---alternate is daughters Lynne/Amanda   Would accept resuscitation but no prolonged ventilation   Probably would not want tube feedings if cognitively unaware   Social Determinants of Health   Financial Resource Strain: Not on file  Food Insecurity: Not on file  Transportation Needs: Not on file  Physical Activity: Not on file  Stress: Not on file  Social Connections: Not on file  Intimate Partner Violence: Not on file   Review of Systems Appetite remains good Weight is up some Bowels are fine    Objective:   Physical Exam Constitutional:      Appearance: Normal appearance.  Cardiovascular:     Rate and Rhythm: Normal rate and regular rhythm.     Heart sounds: No murmur heard. No gallop.   Pulmonary:     Effort: Pulmonary effort is normal.     Breath sounds: Normal breath sounds. No wheezing or rales.  Musculoskeletal:     Cervical back: Neck supple.     Comments: Trace calf fullness  Lymphadenopathy:     Cervical: No cervical adenopathy.  Neurological:     Mental Status: She is alert and oriented to person, place, and time.     Comments: Didn't know President [id number] Recall 1/3            Assessment & Plan:

## 2021-04-13 NOTE — Telephone Encounter (Signed)
Amy nurse with access nurse triaged pt for heart palpitations, SOB and fatigue. Disposition was ED or UC and pt refused. I spoke with pt; for 6 months on and off pt has had SOB with exertion with fatigue and heart palpitations but symptoms have worsened; pt is more aware of above symptoms; pt has swelling from knee down on both legs today; no redness,no warmth and no pain. No CP but pt has had pressure in mid chest that comes and goes for 2 months. No chest pressure now; Memory has been worsening. No UTIs. No hx of heart failure. No swelling in stomach. No heart palpitations for couple months but SOB with exertion is more concerning to pt. No covid symptoms or known exposure. Pt only wants to see Dr Silvio Pate. Scheduled in office appt with Dr Silvio Pate 04/13/21 at [facility]. Pt is aware sending note to Dr Silvio Pate and Larene Beach CMA; if any changes in appt someone will call pt this afternoon otherwise pt will keep appt today at 4 PM. UC & ED precautions given and pt voiced understanding.

## 2021-04-13 NOTE — Telephone Encounter (Signed)
Sounds like she may have CHF---?atrial fib Okay to start with office evaluation today

## 2021-04-13 NOTE — Telephone Encounter (Signed)
Dominique Hall - Client TELEPHONE ADVICE RECORD AccessNurse Patient Name: Dominique Hall Gender: Female DOB: Jan 01, 1935 Age: 86 Y 22 D Return Phone Number: [phone] (Primary) Address: City/ State/ Zip: [place] [address] Client [facility] - Client Client Site [facility] Physician Dominique Hall- MD Contact Type Call Who Is Calling Patient / Member / Family / Caregiver Call Type Triage / Clinical Caller Name Dominique Hall Relationship To Patient Spouse Return Phone Number [phone] (Primary) Chief Complaint BREATHING - shortness of breath or sounds breathless Reason for Call Symptomatic / Request for [name], [name], PT is having breathing problems. Husband wants to speak with RN. He has been scheduled with Dominique Friendly, NP next Tues. She is SOB. It has been ongoing for couple of weeks. Translation No Nurse Assessment Nurse: Dominique Puller, RN, Dominique Date/Time Eilene Hall Time): 04/13/2021 8:47:52 AM Confirm and document reason for call. If symptomatic, describe symptoms. ---Caller states she's had shortness of breath for a couple of weeks now, not getting any worse. Also having some fatigue, edema in lower legs. Has appointment with Dominique Friendly, NP next Tuesday. She says all of these symptoms have come on within the last 3 months. Says she sometimes has heart palpitations when up doing housework. Does the patient have any new or worsening symptoms? ---Yes Will a triage be completed? ---Yes Related visit to physician within the last 2 weeks? ---No Does the PT have any chronic conditions? (i.e. diabetes, asthma, this includes High risk factors for pregnancy, etc.) ---No Is this a behavioral health or substance abuse call? ---No Guidelines Guideline Title Affirmed Question Affirmed Notes Nurse Date/Time Eilene Hall Time) Breathing  Difficulty Extra heart beats OR irregular heart beating (i.e., "palpitations") Ramsey, RN, Dominique 04/13/2021 8:51:16 AM PLEASE NOTE: All timestamps contained within this report are represented as [place] Standard Time. CONFIDENTIALTY NOTICE: This fax transmission is intended only for the addressee. It contains information that is legally privileged, confidential or otherwise protected from use or disclosure. If you are not the intended recipient, you are strictly prohibited from reviewing, disclosing, copying using or disseminating any of this information or taking any action in reliance on or regarding this information. If you have received this fax in error, please notify us immediately by telephone so that we can arrange for its return to [place]. Phone: [phone], Toll-Free: [phone], Fax: [phone] Page: 2 of 2 Call Id: [id number] [place]. Time Eilene Hall Time) Disposition Final User 04/13/2021 8:44:34 AM Send to Urgent Queue Dominique Hall 04/13/2021 9:04:07 AM Go to ED Now Yes Dominique Puller, RN, Dominique Caller Disagree/Comply Disagree Caller Understands Yes PreDisposition Did not know what to do Care Advice Given Per Guideline GO TO ED NOW: * You need to be seen in the Emergency Department. * Go to the ED at ___________ [place] now. Drive carefully. CARE ADVICE given per Breathing Difficulty (Adult) guideline. Patient does not want to go to ER, per office directives this nurse called the backline and spoke to [place], Dominique Hall regarding patient's symptoms and her ER refusal. She says she will call the patient back. Referrals GO TO FACILITY REFUSED

## 2021-04-13 NOTE — Addendum Note (Signed)
Addended by: Pilar Grammes on: 04/13/2021 05:23 PM   Modules accepted: Orders

## 2021-04-13 NOTE — Telephone Encounter (Signed)
Please see note below access nurse note; pt already has appt with Dr Silvio Pate on 04/13/21 at 4 PM.

## 2021-04-13 NOTE — Assessment & Plan Note (Signed)
Cognitive and behavioral changes Some decline seen on my exam Will check labs Stop benedryl and limit clorazepate Family asked for urinalysis --but no delirium and no symptoms (though urinalysis shows leuks). Discussed that antibiotics not indicated in that case Will see back in 3 weeks or so Consider MRI, neurology evaluation, etc

## 2021-04-14 ENCOUNTER — Other Ambulatory Visit: Payer: Self-pay

## 2021-04-14 ENCOUNTER — Other Ambulatory Visit (INDEPENDENT_AMBULATORY_CARE_PROVIDER_SITE_OTHER): Payer: 59

## 2021-04-14 DIAGNOSIS — R002 Palpitations: Secondary | ICD-10-CM | POA: Diagnosis not present

## 2021-04-14 DIAGNOSIS — R4189 Other symptoms and signs involving cognitive functions and awareness: Secondary | ICD-10-CM

## 2021-04-14 DIAGNOSIS — R0609 Other forms of dyspnea: Secondary | ICD-10-CM

## 2021-04-14 DIAGNOSIS — R0600 Dyspnea, unspecified: Secondary | ICD-10-CM

## 2021-04-14 LAB — CBC
HCT: 36.3 % (ref 36.0–46.0)
Hemoglobin: 12.5 g/dL (ref 12.0–15.0)
MCHC: 34.5 g/dL (ref 30.0–36.0)
MCV: 84.7 fl (ref 78.0–100.0)
Platelets: 143 10*3/uL — ABNORMAL LOW (ref 150.0–400.0)
RBC: 4.29 Mil/uL (ref 3.87–5.11)
RDW: 13.4 % (ref 11.5–15.5)
WBC: 4.5 10*3/uL (ref 4.0–10.5)

## 2021-04-14 LAB — T4, FREE: Free T4: 0.94 ng/dL (ref 0.60–1.60)

## 2021-04-14 LAB — VITAMIN B12: Vitamin B-12: 174 pg/mL — ABNORMAL LOW (ref 211–911)

## 2021-04-14 LAB — SEDIMENTATION RATE: Sed Rate: 6 mm/hr (ref 0–30)

## 2021-04-17 ENCOUNTER — Other Ambulatory Visit: Payer: Self-pay | Admitting: Internal Medicine

## 2021-04-17 LAB — COMPREHENSIVE METABOLIC PANEL
ALT: 11 U/L (ref 0–35)
AST: 14 U/L (ref 0–37)
Albumin: 4.2 g/dL (ref 3.5–5.2)
Alkaline Phosphatase: 65 U/L (ref 39–117)
BUN: 29 mg/dL — ABNORMAL HIGH (ref 6–23)
CO2: 27 mEq/L (ref 19–32)
Calcium: 9.5 mg/dL (ref 8.4–10.5)
Chloride: 108 mEq/L (ref 96–112)
Creatinine, Ser: 0.81 mg/dL (ref 0.40–1.20)
GFR: 66.82 mL/min (ref >60.00)
Glucose, Bld: 115 mg/dL — ABNORMAL HIGH (ref 70–99)
Potassium: 3.6 mEq/L (ref 3.5–5.1)
Sodium: 146 mEq/L — ABNORMAL HIGH (ref 135–145)
Total Bilirubin: 0.7 mg/dL (ref 0.2–1.2)
Total Protein: 6.1 g/dL (ref 6.0–8.3)

## 2021-04-17 LAB — RPR: RPR Ser Ql: NONREACTIVE

## 2021-04-17 LAB — HIV ANTIBODY (ROUTINE TESTING W REFLEX): HIV 1&2 Ab, 4th Generation: NONREACTIVE

## 2021-04-18 ENCOUNTER — Ambulatory Visit: Payer: Self-pay | Admitting: *Deleted

## 2021-04-18 ENCOUNTER — Telehealth: Payer: Self-pay

## 2021-04-18 ENCOUNTER — Other Ambulatory Visit: Payer: Self-pay

## 2021-04-18 NOTE — Telephone Encounter (Signed)
[phone] Pt's husband called reporting that pt is having difficulty breathing. Pt could not come to the line at the time  [phone]  Called patient regarding difficulty breathing symptoms. Patient reports she had some difficulty breathing this am while cooking breakfast and had to lay down after the episode of difficulty breathing last no longer than 2 minutes. Patient reports she is feeling "fine" at this time and no difficulty breathing reported.  appt scheduled for 04/25/21 with PCP. Care advise given. Patient and husband verbalized understanding of care advise and to call back or go to [facility] or ED if symptoms worsen.   Reason for Disposition . [1] MILD longstanding difficulty breathing AND [2]  SAME as normal  Answer Assessment - Initial Assessment Questions 1. RESPIRATORY STATUS: "Describe your breathing?" (e.g., wheezing, shortness of breath, unable to speak, severe coughing)      No breathing difficulty at this time  2. ONSET: "When did this breathing problem begin?"     This am cooking breakfast. Noticed episodes since approx. 6 months ago . 3. PATTERN "Does the difficult breathing come and go, or has it been constant since it started?"      Comes and goes 4. SEVERITY: "How bad is your breathing?" (e.g., mild, moderate, severe)    - MILD: No SOB at rest, mild SOB with walking, speaks normally in sentences, can lie down, no retractions, pulse < 100.    - MODERATE: SOB at rest, SOB with minimal exertion and prefers to sit, cannot lie down flat, speaks in phrases, mild retractions, audible wheezing, pulse 100-120.    - SEVERE: Very SOB at rest, speaks in single words, struggling to breathe, sitting hunched forward, retractions, pulse > 120      Na  5. RECURRENT SYMPTOM: "Have you had difficulty breathing before?" If Yes, ask: "When was the last time?" and "What happened that time?"      Yes comes and goes  6. CARDIAC HISTORY: "Do you have any history of heart disease?" (e.g., heart  attack, angina, bypass surgery, angioplasty)      Na  7. LUNG HISTORY: "Do you have any history of lung disease?"  (e.g., pulmonary embolus, asthma, emphysema)     Na  8. CAUSE: "What do you think is causing the breathing problem?"      Not sure  9. OTHER SYMPTOMS: "Do you have any other symptoms? (e.g., dizziness, runny nose, cough, chest pain, fever)     No  10. O2 SATURATION MONITOR:  "Do you use an oxygen saturation monitor (pulse oximeter) at home?" If Yes, "What is your reading (oxygen level) today?" "What is your usual oxygen saturation reading?" (e.g., 95%)       na 11. PREGNANCY: "Is there any chance you are pregnant?" "When was your last menstrual period?"       na 12. TRAVEL: "Have you traveled out of the country in the last month?" (e.g., travel history, exposures)       na  Protocols used: BREATHING DIFFICULTY-A-AH

## 2021-04-18 NOTE — Telephone Encounter (Signed)
Noted, will evaluate. 

## 2021-04-18 NOTE — Progress Notes (Signed)
Add to waitlist next available 3 weeks out.

## 2021-04-18 NOTE — Telephone Encounter (Signed)
[facility] - Client TELEPHONE ADVICE RECORD [facility] Patient Name: Dominique Hall Gender: Female DOB: [birth year] Age: 86 Y 66 D Return Phone Number: [phone] (Primary) Address: City/ State/ Zip: [place] [address] Client [place] [facility] - Client Client Site [facility] - [facility] Physician [name] Contact Type Call Who Is Calling Patient / Member / Family / Caregiver Call Type Triage / Clinical Caller Name Herbie Baltimore Relationship To Patient Spouse Return Phone Number [phone] (Primary) Chief Complaint BREATHING - shortness of breath or sounds breathless Reason for Call Symptomatic / Request for Health Information Initial Comment Caller's wife has breathing issues like shortness of breath Translation No Nurse Assessment Nurse: Altamease Oiler, RN, Adriana Date/Time (Eastern Time): 04/18/2021 12:41:36 PM Confirm and document reason for call. If symptomatic, describe symptoms. ---caller states pt was recently seen. pt has history of trouble breathing. wants to be seen by pulmonary doctor. Does the patient have any new or worsening symptoms? ---No Disp. Time Eilene Ghazi Time) Disposition Final User 04/18/2021 12:39:42 PM Send to Urgent Queue Jaclyn Prime 04/18/2021 12:55:57 PM Clinical Call Yes Altamease Oiler, RN, Adriana Comments User: Kizzie Fantasia, RN Date/Time Eilene Ghazi Time): 04/18/2021 12:55:46 PM connected pt with office triage nurse

## 2021-04-18 NOTE — Telephone Encounter (Signed)
Please see note below access nurse note. 

## 2021-04-18 NOTE — Progress Notes (Signed)
LVM for patient to call back and schedule

## 2021-04-18 NOTE — Telephone Encounter (Signed)
Dominique Hall with access nurse said pt calling for referral to pulmonologist; I spoke with pt and this morning after showering pt was doing fine and when started to get dressed pt had episode like she has been having (no worse) that she got SOB with exertion of getting dressed and pt sat down and SOB only lasted 2 - 3 mins. Pt said she does not feel any different than when seen on 04/13/21 but pt was wondering if should get referral to pulmonologist.pt stated "this is more of an annoying thing with the SOB upon exertion."  From the 04/13/21 note Dr Silvio Pate noted about getting pt back in to see DR Rockey Situ; pt said she is OK to send the note to Dr Silvio Pate who is out of office, Larene Beach CMA and Gentry Fitz NP who is in office and pt is scheduled to see Gentry Fitz NP already on 04/25/20 at 10:40 AM. Pt was not in any distress while talking with her on phone; no SOB noted. Pt request cb after reviewed by a provider. UC & ED precautions given and pt voiced understanding.

## 2021-04-19 ENCOUNTER — Encounter: Payer: Self-pay | Admitting: Nurse Practitioner

## 2021-04-19 ENCOUNTER — Ambulatory Visit (INDEPENDENT_AMBULATORY_CARE_PROVIDER_SITE_OTHER): Payer: Medicare Other | Admitting: Nurse Practitioner

## 2021-04-19 ENCOUNTER — Other Ambulatory Visit: Payer: Self-pay

## 2021-04-19 VITALS — BP 140/80 | HR 88 | Ht 65.0 in | Wt 160.2 lb

## 2021-04-19 DIAGNOSIS — I200 Unstable angina: Secondary | ICD-10-CM

## 2021-04-19 DIAGNOSIS — I10 Essential (primary) hypertension: Secondary | ICD-10-CM | POA: Diagnosis not present

## 2021-04-19 MED ORDER — ISOSORBIDE MONONITRATE ER 30 MG PO TB24: 15.0000 mg | ORAL_TABLET | Freq: Every day | ORAL | 3 refills | Status: DC

## 2021-04-19 NOTE — Progress Notes (Signed)
Office Visit    Patient Name: Dominique Hall Date of Encounter: 04/19/2021  Primary Care Provider:  Venia Carbon, MD Primary Cardiologist:  Ida Rogue, MD  Chief Complaint    86 y/o ? w/ a h/o chest pain and prior normal stress test, palpitations, hypertension, pulmonary hypertension, and GERD, who presents for follow-up related to unstable angina.  Past Medical History    Past Medical History:  Diagnosis Date  . Allergy   . Arthritis   . Chest pain    a. 07/2019 MV: No ischemia/infarct. Low risk.  . Diverticulosis   . Esophageal dysmotility   . Esophageal ring   . GERD (gastroesophageal reflux disease)   . Hiatal hernia   . HTN (hypertension)   . IBS (irritable bowel syndrome)   . Osteopenia   . PAH (pulmonary artery hypertension) ([place])    a. 07/2019 Echo: EF 60-65%, impaired relaxation. Nl RV size/fxn. PASP 90mmHg. Mildly dil LA.  Marland Kitchen Palpitations   . Renal disorder   . Sleep disturbance    Past Surgical History:  Procedure Laterality Date  . ABDOMINAL HYSTERECTOMY    . APPENDECTOMY  1973  . BREAST EXCISIONAL BIOPSY Right   . CHOLECYSTECTOMY    . ERCP N/A 11/22/2017   Procedure: ENDOSCOPIC RETROGRADE CHOLANGIOPANCREATOGRAPHY (ERCP);  Surgeon: Lucilla Lame, MD;  Location: [facility] ENDOSCOPY;  Service: Endoscopy;  Laterality: N/A;  . ESOPHAGOGASTRODUODENOSCOPY  07-2006   with dilation  . [place]  . lumpectomy  [phone]   benign right breast  . SQUAMOUS CELL CARCINOMA EXCISION  6/12   Right leg--Dr Pat Patrick  . SQUAMOUS CELL CARCINOMA EXCISION  2012   right calf  . TOTAL ABDOMINAL HYSTERECTOMY W/ BILATERAL SALPINGOOPHORECTOMY  1973    Allergies  Allergies  Allergen Reactions  . Penicillins     Has patient had a PCN reaction causing immediate rash, facial/tongue/throat swelling, SOB or lightheadedness with hypotension: Unknown Has patient had a PCN reaction causing severe rash involving mucus membranes or skin necrosis: Unknown Has patient had  a PCN reaction that required hospitalization: Unknown Has patient had a PCN reaction occurring within the last 10 years: Unknown If all of the above answers are "NO", then may proceed with Cephalosporin use.  . Rabeprazole Sodium     REACTION: constipated  . Sulfonamide Derivatives     REACTION: unspecified  . Cefuroxime     diarrhea    History of Present Illness    86 year old female with the above past medical history including chest pain, palpitations, hypertension, pulmonary hypertension, and GERD.  She previously underwent evaluation for chest pain in 2020, with stress test showing no evidence of ischemia or infarct.  Echocardiogram at that time showed an EF of 60 to 65% with impaired relaxation, normal RV function, and a pulmonary artery systolic pressure 42 mmHg.  She previously had issues with orthostatic lightheadedness and initially improved off of hydrochlorothiazide.  She did eventually resume this but it was subsequently discontinued.  Over the past 3 or so months, she has been noticing an increase in exertional upper chest burning associated with dyspnea, lasting 2 to 5 minutes, resolving with rest.  She has never had symptoms at rest or nocturnal symptoms.  Over the past 2 to 3 weeks, symptoms have been occurring with more regularity, up to 10 times per week, but always resolving with rest.  She denies palpitations, PND, orthopnea, dizziness, syncope, edema, or early satiety.  Her husband has noticed that she is much  more forgetful and he has also noted some behavioral changes.  They recently saw her primary care provider and clorazepate and Benadryl were discontinued.  She was advised to follow-up with cardiology in the setting of symptoms concerning for unstable angina.  EKG is without acute changes today.  Home Medications    Prior to Admission medications   Medication Sig Start Date End Date Taking? Authorizing Provider  celecoxib (CELEBREX) 200 MG capsule Take 1 capsule (200  mg total) by mouth daily. 12/01/20  Yes Venia Carbon, MD  Cholecalciferol (VITAMIN D-3) 25 MCG (1000 UT) CAPS Take 1 capsule by mouth daily.   Yes [provider]  isosorbide mononitrate (IMDUR) 30 MG 24 hr tablet Take 0.5 tablets (15 mg total) by mouth daily. 04/19/21 07/18/21 Yes Theora Gianotti, NP  NON FORMULARY Immunity support gummies Takes 1 gummy daily.   Yes [provider]  NON FORMULARY Black elderberry gummy Takes 1 gummy qd.   Yes [provider]  Omega-3 Fatty Acids (FISH OIL PO) Take 1 capsule by mouth daily.   Yes [provider]  omeprazole (PRILOSEC) 20 MG capsule TAKE 1 CAPSULE(20 MG) BY MOUTH DAILY 04/17/21  Yes Venia Carbon, MD  vitamin C (ASCORBIC ACID) 500 MG tablet Take 500 mg by mouth daily.   Yes [provider]     Review of Systems    Her husband has noted some behavioral changes and cognitive decline.  Over the past 2 to 3 months and worse in the past 1 to 2 weeks, she has been having exertional upper chest burning associated with dyspnea as outlined above.  She denies palpitations, PND, orthopnea, dizziness, syncope, edema, or early satiety.  All other systems reviewed and are otherwise negative except as noted above.  Physical Exam    VS:  BP 140/80 (BP Location: Left Arm, Patient Position: Sitting, Cuff Size: Normal)   Pulse 88   Ht 5\' 5"  (1.651 m)   Wt 160 lb 4 oz (72.7 kg)   SpO2 98%   BMI 26.67 kg/m  , BMI Body mass index is 26.67 kg/m. GEN: Well nourished, well developed, in no acute distress. HEENT: normal. Neck: Supple, no JVD, carotid bruits, or masses. Cardiac: RRR, no murmurs, rubs, or gallops. No clubbing, cyanosis, edema.  Radials/PT 2+ and equal bilaterally.  Respiratory:  Respirations regular and unlabored, clear to auscultation bilaterally. GI: Soft, nontender, nondistended, BS + x 4. MS: no deformity or atrophy. Skin: warm and dry, no rash. Neuro:  Strength and sensation are  intact. Psych: Normal affect.  Accessory Clinical Findings    ECG personally reviewed by me today -regular sinus rhythm, 84 - no acute changes.  Lab Results  Component Value Date   WBC 4.5 04/14/2021   HGB 12.5 04/14/2021   HCT 36.3 04/14/2021   MCV 84.7 04/14/2021   PLT 143.0 (L) 04/14/2021   Lab Results  Component Value Date   CREATININE 0.81 04/14/2021   BUN 29 (H) 04/14/2021   NA 146 (H) 04/14/2021   K 3.6 04/14/2021   CL 108 04/14/2021   CO2 27 04/14/2021   Lab Results  Component Value Date   ALT 11 04/14/2021   AST 14 04/14/2021   ALKPHOS 65 04/14/2021   BILITOT 0.7 04/14/2021   Lab Results  Component Value Date   CHOL 152 02/22/2014   HDL 63.00 02/22/2014   LDLCALC 69 02/22/2014   TRIG 98.0 02/22/2014   CHOLHDL 2 02/22/2014    Assessment &  Plan    1.  Unstable angina: Patient presents with a 2 to 40-month history of exertional upper chest burning and dyspnea on exertion, occurring up to 10 times per week, lasting between 2 and 5 minutes, resolving with rest.  She has never had nocturnal or other rest symptoms.  ECG today is without acute ST or T changes.  I discussed options for management with her and her husband today and we collectively agreed to start with a noninvasive evaluation-Lexiscan Myoview.  Of course, if this is high risk for ischemia, we can at that point pursue diagnostic catheterization.  The benefits (risk stratification, diagnosing coronary artery disease, treatment guidance) and risks [chest pain, dyspnea, cardiac arrhythmias, dizziness, low blood pressure, allergic reaction, heart attack, and life-threatening complications (estimated to be 1 in 10,000)] were discussed in detail with Dominique Hall and she agrees to proceed.  I am going to add isosorbide mononitrate 15 mg daily and we will plan to see her back shortly after her stress test.  2.  Essential hypertension: Blood pressure elevated today.  Review of previous trends shows that she  typically runs more normally.  Adding low-dose nitrate in the setting of above.  3.  GERD: Symptoms the patient is describing do not appear to be consistent with GERD.  Plus, she is already on PPI.  4.  Disposition: Follow-up stress testing as outlined above.  Follow-up in clinic following stress test.  Dominique Hodgkins, NP 04/19/2021, 5:47 PM

## 2021-04-19 NOTE — Patient Instructions (Signed)
Medication Instructions:  Your physician has recommended you make the following change in your medication:   1. START Isosorbide Mononitrate (Imdur) 30 mg take 1/2 tablet (one half a tablet 15 mg) once daily   *If you need a refill on your cardiac medications before your next appointment, please call your pharmacy*   Lab Work: None  If you have labs (blood work) drawn today and your tests are completely normal, you will receive your results only by: Marland Kitchen MyChart Message (if you have MyChart) OR . A paper copy in the mail If you have any lab test that is abnormal or we need to change your treatment, we will call you to review the results.   Testing/Procedures: [place]  Your caregiver has ordered a Stress Test with nuclear imaging. The purpose of this test is to evaluate the blood supply to your heart muscle. This procedure is referred to as a "Non-Invasive Stress Test." This is because other than having an IV started in your vein, nothing is inserted or "invades" your body. Cardiac stress tests are done to find areas of poor blood flow to the heart by determining the extent of coronary artery disease (CAD). Some patients exercise on a treadmill, which naturally increases the blood flow to your heart, while others who are  unable to walk on a treadmill due to physical limitations have a pharmacologic/chemical stress agent called Lexiscan . This medicine will mimic walking on a treadmill by temporarily increasing your coronary blood flow.   Please note: these test may take anywhere between 2-4 hours to complete  PLEASE REPORT TO [place] AT THE FIRST DESK WILL DIRECT YOU WHERE TO GO  Date of Procedure:_____________________________________  Arrival Time for Procedure:______________________________    PLEASE NOTIFY THE OFFICE AT LEAST 24 HOURS IN ADVANCE IF YOU ARE UNABLE TO KEEP YOUR APPOINTMENT.  [phone] AND  PLEASE NOTIFY NUCLEAR MEDICINE AT  [facility] AT LEAST 24 HOURS IN ADVANCE IF YOU ARE UNABLE TO KEEP YOUR APPOINTMENT. [phone]  How to prepare for your Myoview test:  1. Do not eat or drink after midnight 2. No caffeine for 24 hours prior to test 3. No smoking 24 hours prior to test. 4. Your medication may be taken with water.  If your doctor stopped a medication because of this test, do not take that medication. 5. Ladies, please do not wear dresses.  Skirts or pants are appropriate. Please wear a short sleeve shirt. 6. No perfume, cologne or lotion. 7. Wear comfortable walking shoes. No heels!    Follow-Up: At [facility], you and your health needs are our priority.  As part of our continuing mission to provide you with exceptional heart care, we have created designated Provider Care Teams.  These Care Teams include your primary Cardiologist (physician) and Advanced Practice Providers (APPs -  Physician Assistants and Nurse Practitioners) who all work together to provide you with the care you need, when you need it.   Your next appointment:   2 week(s)  The format for your next appointment:   In Person  Provider:   Ida Rogue, MD or Murray Hodgkins, NP

## 2021-04-20 ENCOUNTER — Telehealth: Payer: Self-pay | Admitting: Cardiovascular Disease

## 2021-04-20 ENCOUNTER — Other Ambulatory Visit: Payer: Self-pay

## 2021-04-20 DIAGNOSIS — E538 Deficiency of other specified B group vitamins: Secondary | ICD-10-CM

## 2021-04-20 NOTE — Telephone Encounter (Signed)
Spoke to Dr. Garen Lah (DOD) and he recommended that the patient not take the Isosorbide. Also to push fluids, and if the low BP episodes continue then to go to the ED.  I called patients husband back and relayed the recommendations and he was grateful for the call. Verbalized understanding and agreed with plan.  Will route to Ignacia Bayley for an [place].

## 2021-04-20 NOTE — Telephone Encounter (Signed)
Thank you for the heads up Coleen.  To clarify, she had not yet started imdur?  She did not have any other antihypertensives on her list at yesterdays visit, but I did note that in Dr. Donivan Scull previous note, that she had started taking HCTZ again, on her own.  It has since been removed from her list.  Can you pls make sure that she isn't taking HCTZ, or any other antihypertensives that which we are unaware?

## 2021-04-20 NOTE — Telephone Encounter (Signed)
Pt c/o BP issue: STAT if pt c/o blurred vision, one-sided weakness or slurred speech  1. What are your last 5 BP readings? 6/9 92/72, 6/8 84/54  2. Are you having any other symptoms (ex. Dizziness, headache, blurred vision, passed out)? Confused, light headed  3. What is your BP issue? no

## 2021-04-20 NOTE — Telephone Encounter (Signed)
Spoke with patients husband and he stated that the patient had an episode of dizziness and confusion yesterday after her appointment here with Ignacia Bayley. Her BP at that time was 84/54. Then today a neighbor noticed her pulled over down the street from her house and then notified him, and she had another episode while driving but did pull over in her car. He again checked her BP and it was 92/72. Ignacia Bayley started Isosorbide 15mg  once daily at her OV yesterday. Her husband just picked this prescription up. I advised that he not give it to her yet. I asked how her fluid intake is and he stated she does not drink much fluid at all. I advised to have her drink a gatorade and to push water intake which he agreed to do asap.  Will route to DOD for any further recommendations.

## 2021-04-21 NOTE — Telephone Encounter (Signed)
Called patients husband, VM is full. Will try again in a couple hours.

## 2021-04-24 ENCOUNTER — Telehealth: Payer: Self-pay

## 2021-04-24 NOTE — Telephone Encounter (Signed)
I spoke with Mr Renfrow DPR signed who wants to cancel with Gentry Fitz NP on 21/[birth year]/73  due to conflict with another appt and pt's husband said pt does not need appt now since doing better and wants to keep already scheduled appt with Dr Silvio Pate already scheduled on 05/11/21. See phone note on 04/18/21.Mr Deitrick is sure he wants to cancel appt. Appt with Gentry Fitz NP on 04/25/21 cancelled as requested.Sending note to DR Silvio Pate and Larene Beach CMA as FYI.

## 2021-04-24 NOTE — Telephone Encounter (Signed)
Her DOE is most likely cardiac--and she has seen cardiology. I am glad to hear she is feeling okay---okay to wait for the planned 6/30 visit

## 2021-04-24 NOTE — Telephone Encounter (Signed)
[place] Night - Client Nonclinical Telephone Record AccessNurse Client [place] Night - Client Client Site [place] - Night Contact Type Call Who Is Calling Patient / Member / Family / Caregiver Caller Name [name] Phone Number [phone] Patient Name Dominique Hall Patient DOB [birth year] Call Type Message Only Information Provided Reason for Call Request to Reschedule Office Appointment Initial Comment Caller states he is needing to schedule an appointment. Patient request to speak to RN No Disp. Time Disposition Final User 04/24/2021 8:08:52 AM General Information Provided Yes Domingo Dimes, [place] Call Closed By: Memory Argue Transaction Date/Time: 04/24/2021 8:06:41 AM (ET)

## 2021-04-25 ENCOUNTER — Ambulatory Visit: Payer: 59 | Admitting: Primary Care

## 2021-05-01 ENCOUNTER — Other Ambulatory Visit: Payer: Self-pay

## 2021-05-01 ENCOUNTER — Ambulatory Visit
Admission: RE | Admit: 2021-05-01 | Discharge: 2021-05-01 | Disposition: A | Discharge status: Home / Self Care | Payer: Medicare Other | Source: Ambulatory Visit | Attending: Nurse Practitioner | Admitting: Nurse Practitioner

## 2021-05-01 DIAGNOSIS — I200 Unstable angina: Secondary | ICD-10-CM | POA: Diagnosis present

## 2021-05-01 LAB — NM MYOCAR MULTI W/SPECT W/WALL MOTION / EF
Estimated workload: 1 METS
Exercise duration (min): 0 min
Exercise duration (sec): 0 s
LV dias vol: 37 mL (ref 46–106)
LV sys vol: 4 mL
MPHR: 136 {beats}/min
Peak HR: 123 {beats}/min
Percent HR: 90 %
Rest HR: 87 {beats}/min
TID: 0.7

## 2021-05-01 MED ORDER — REGADENOSON 0.4 MG/5ML IV SOLN
0.4000 mg | Freq: Once | INTRAVENOUS | Status: AC
  Administered 2021-05-01: 0.4 mg via INTRAVENOUS

## 2021-05-01 MED ORDER — TECHNETIUM TC 99M TETROFOSMIN IV KIT
31.3200 | PACK | Freq: Once | INTRAVENOUS | Status: AC | PRN
  Administered 2021-05-01: 31.32 via INTRAVENOUS

## 2021-05-01 MED ORDER — TECHNETIUM TC 99M TETROFOSMIN IV KIT
10.0000 | PACK | Freq: Once | INTRAVENOUS | Status: AC | PRN
  Administered 2021-05-01: 9.19 via INTRAVENOUS

## 2021-05-02 ENCOUNTER — Ambulatory Visit: Payer: 59 | Admitting: Internal Medicine

## 2021-05-02 ENCOUNTER — Telehealth: Payer: Self-pay

## 2021-05-02 ENCOUNTER — Telehealth: Payer: Self-pay | Admitting: *Deleted

## 2021-05-02 MED ORDER — METOPROLOL TARTRATE 25 MG PO TABS: 12.5000 mg | ORAL_TABLET | Freq: Two times a day (BID) | ORAL | 3 refills | Status: DC

## 2021-05-02 NOTE — Telephone Encounter (Signed)
-----   Message from Theora Gianotti, NP sent at 05/02/2021  8:45 AM EDT ----- Normal stress test.  We did note an episode of fast heart rhythm from the top chambers during the study.  It's possible that brief runs of fast heart beats may be contributing to symptoms @ home. Overall, this is a reassuring study but based on elevated HR, I recommend starting metoprolol 12.5mg  BID.

## 2021-05-02 NOTE — Telephone Encounter (Signed)
Left voicemail message to call back for review of results.  

## 2021-05-02 NOTE — Telephone Encounter (Addendum)
Patient is scheduled to see Dr Glori Bickers on Friday 05/05/21 for UTI-burning with urination, nothing was available sooner and patient originally wanted to see female provider. Patient calling now to see if she can be worked in with Dr Silvio Pate on 05/04/21, she is not able to come in tomorrow on 05/03/21.   She would rather see a female but if Dr Silvio Pate could work her in on 05/04/21 she would come anytime. Patient wanted me to keep her appointment with Dr Glori Bickers on 05/05/21 for now until she knows for sure she can be seen sooner.

## 2021-05-02 NOTE — Telephone Encounter (Signed)
Reviewed results and recommendations with patient. Instructed her to start metoprolol tartrate 25 mg and take half a tablet (12.5 mg) twice a day. She was agreeable with this plan and has no further questions at this time. Prescription sent in and confirmed her upcoming appointment.

## 2021-05-03 ENCOUNTER — Ambulatory Visit: Payer: 59 | Admitting: Nurse Practitioner

## 2021-05-03 NOTE — Telephone Encounter (Signed)
Mrs. Rebuck called in and wanted to let Dr. Silvio Pate know that she is going to keep her appointment on Friday with Dr. Glori Bickers.

## 2021-05-03 NOTE — Telephone Encounter (Signed)
Thank you. I will let Dr Silvio Pate know.

## 2021-05-03 NOTE — Telephone Encounter (Signed)
I can add on at 1:45 or 1:30PM tomorrow (depending on whether the other person also wants to move from Friday or not)

## 2021-05-04 ENCOUNTER — Ambulatory Visit: Payer: 59

## 2021-05-05 ENCOUNTER — Encounter: Payer: Self-pay | Admitting: Family Medicine

## 2021-05-05 ENCOUNTER — Ambulatory Visit (INDEPENDENT_AMBULATORY_CARE_PROVIDER_SITE_OTHER): Payer: Medicare Other | Admitting: Family Medicine

## 2021-05-05 ENCOUNTER — Other Ambulatory Visit: Payer: Self-pay

## 2021-05-05 ENCOUNTER — Telehealth: Payer: Self-pay | Admitting: Nurse Practitioner

## 2021-05-05 VITALS — BP 162/80 | HR 77 | Temp 96.7°F | Ht 65.0 in | Wt 156.1 lb

## 2021-05-05 DIAGNOSIS — R300 Dysuria: Secondary | ICD-10-CM

## 2021-05-05 DIAGNOSIS — N3000 Acute cystitis without hematuria: Secondary | ICD-10-CM | POA: Diagnosis not present

## 2021-05-05 LAB — POC URINALSYSI DIPSTICK (AUTOMATED)
Bilirubin, UA: 1
Blood, UA: 50
Glucose, UA: NEGATIVE
Ketones, UA: 5
Nitrite, UA: NEGATIVE
Protein, UA: POSITIVE — AB
Spec Grav, UA: 1.02 (ref 1.010–1.025)
Urobilinogen, UA: 1 E.U./dL
pH, UA: 6 (ref 5.0–8.0)

## 2021-05-05 MED ORDER — NITROFURANTOIN MONOHYD MACRO 100 MG PO CAPS: 100.0000 mg | ORAL_CAPSULE | Freq: Two times a day (BID) | ORAL | 0 refills | Status: DC

## 2021-05-05 NOTE — Progress Notes (Signed)
Subjective:    Patient ID: Dominique Hall, female    DOB: June 14, 1935, 86 y.o.   MRN: [id number]  This visit occurred during the SARS-CoV-2 public health emergency.  Safety protocols were in place, including screening questions prior to the visit, additional usage of staff PPE, and extensive cleaning of exam room while observing appropriate contact time as indicated for disinfecting solutions.   HPI 86 yo pt of Dr Silvio Pate presents with urinary symptoms   Wt Readings from Last 3 Encounters:  05/05/21 156 lb 2 oz (70.8 kg)  04/19/21 160 lb 4 oz (72.7 kg)  04/13/21 161 lb (73 kg)   25.98 kg/m  Symptoms for more than a week  Burning to urinate -getting worse  No bladder pain  More frequency and urgency  No blood in urine No fever or nausea  No flank pain   No uti in a long time   H/o kidney stones None passed recently   UA: Results for orders placed or performed in visit on 05/05/21  POCT Urinalysis Dipstick (Automated)  Result Value Ref Range   Color, UA Dark Yellow    Clarity, UA Hazy    Glucose, UA Negative Negative   Bilirubin, UA 1 mg/dL    Ketones, UA 5 mg/dL    Spec Grav, UA 1.020 1.010 - 1.025   Blood, UA 50 Ery/uL    pH, UA 6.0 5.0 - 8.0   Protein, UA Positive (A) Negative   Urobilinogen, UA 1.0 0.2 or 1.0 E.U./dL   Nitrite, UA Negative    Leukocytes, UA Large (3+) (A) Negative    Lab Results  Component Value Date   CREATININE 0.81 04/14/2021   BUN 29 (H) 04/14/2021   NA 146 (H) 04/14/2021   K 3.6 04/14/2021   CL 108 04/14/2021   CO2 27 04/14/2021   Patient Active Problem List   Diagnosis Date Noted   Acute cystitis 05/05/2021   DOE (dyspnea on exertion) 04/13/2021   Cognitive changes 04/13/2021   Lightheadedness 02/22/2021   Acute bronchitis 10/27/2020   Generalized osteoarthritis 05/06/2019   Dermatitis 05/22/2018   Left knee pain 12/14/2016   Solitary pulmonary nodule 12/22/2015   Cough 11/09/2015   Advance directive discussed with  patient 03/17/2015   Atrophic vaginitis 01/29/2014   Cystocele, grade 1-2 01/29/2014   Fibrocystic breast changes 12/05/2012   Routine general medical examination at a health care facility 08/14/2011   Mild mood disorder ([place]) 04/15/2007   Essential hypertension, benign 04/15/2007   ALLERGIC RHINITIS 04/15/2007   GERD 04/15/2007   DIVERTICULOSIS, COLON 04/15/2007   OSTEOPENIA 04/15/2007   Past Medical History:  Diagnosis Date   Allergy    Arthritis    Chest pain    a. 07/2019 MV: No ischemia/infarct. Low risk.   Diverticulosis    Esophageal dysmotility    Esophageal ring    GERD (gastroesophageal reflux disease)    Hiatal hernia    HTN (hypertension)    IBS (irritable bowel syndrome)    Osteopenia    PAH (pulmonary artery hypertension) ([place])    a. 07/2019 Echo: EF 60-65%, impaired relaxation. Nl RV size/fxn. PASP 50mmHg. Mildly dil LA.   Palpitations    Renal disorder    Sleep disturbance    Past Surgical History:  Procedure Laterality Date   ABDOMINAL HYSTERECTOMY     APPENDECTOMY  1973   BREAST EXCISIONAL BIOPSY Right    CHOLECYSTECTOMY     ERCP N/A 11/22/2017   Procedure: ENDOSCOPIC RETROGRADE CHOLANGIOPANCREATOGRAPHY (  ERCP);  Surgeon: Lucilla Lame, MD;  Location: [facility] ENDOSCOPY;  Service: Endoscopy;  Laterality: N/A;   ESOPHAGOGASTRODUODENOSCOPY  07-2006   with dilation   KIDNEY SURGERY  1970   lumpectomy  [phone]   benign right breast   SQUAMOUS CELL CARCINOMA EXCISION  6/12   Right leg--Dr Cresenciano Lick CELL CARCINOMA EXCISION  2012   right calf   TOTAL ABDOMINAL HYSTERECTOMY W/ BILATERAL SALPINGOOPHORECTOMY  1973   Social History   Tobacco Use   Smoking status: Former    Pack years: 0.00   Smokeless tobacco: Never  Substance Use Topics   Alcohol use: Yes    Comment: occ   Drug use: No   Family History  Problem Relation Age of Onset   Heart attack Father    Stroke Mother    Melanoma Daughter    Allergies  Allergen Reactions   Penicillins      Has patient had a PCN reaction causing immediate rash, facial/tongue/throat swelling, SOB or lightheadedness with hypotension: Unknown Has patient had a PCN reaction causing severe rash involving mucus membranes or skin necrosis: Unknown Has patient had a PCN reaction that required hospitalization: Unknown Has patient had a PCN reaction occurring within the last 10 years: Unknown If all of the above answers are "NO", then may proceed with Cephalosporin use.   Rabeprazole Sodium     REACTION: constipated   Sulfonamide Derivatives     REACTION: unspecified   Cefuroxime     diarrhea   Current Outpatient Medications on File Prior to Visit  Medication Sig Dispense Refill   celecoxib (CELEBREX) 200 MG capsule Take 1 capsule (200 mg total) by mouth daily. 90 capsule 3   Cholecalciferol (VITAMIN D-3) 25 MCG (1000 UT) CAPS Take 1 capsule by mouth daily.     NON FORMULARY Immunity support gummies Takes 1 gummy daily.     NON FORMULARY Black elderberry gummy Takes 1 gummy qd.     Omega-3 Fatty Acids (FISH OIL PO) Take 1 capsule by mouth daily.     omeprazole (PRILOSEC) 20 MG capsule TAKE 1 CAPSULE(20 MG) BY MOUTH DAILY 90 capsule 3   vitamin C (ASCORBIC ACID) 500 MG tablet Take 500 mg by mouth daily.     metoprolol tartrate (LOPRESSOR) 25 MG tablet Take 0.5 tablets (12.5 mg total) by mouth 2 (two) times daily. (Patient not taking: Reported on 05/05/2021) 90 tablet 3   No current facility-administered medications on file prior to visit.    Review of Systems  Constitutional:  Positive for fatigue. Negative for activity change, appetite change and fever.  HENT:  Negative for congestion and sore throat.   Eyes:  Negative for itching and visual disturbance.  Respiratory:  Negative for cough and shortness of breath.   Cardiovascular:  Negative for leg swelling.  Gastrointestinal:  Negative for abdominal distention, abdominal pain, constipation, diarrhea and nausea.  Endocrine: Negative for cold  intolerance and polydipsia.  Genitourinary:  Positive for dysuria, frequency and urgency. Negative for difficulty urinating, flank pain and hematuria.  Musculoskeletal:  Negative for myalgias.  Skin:  Negative for rash.  Allergic/Immunologic: Negative for immunocompromised state.  Neurological:  Negative for dizziness and weakness.  Hematological:  Negative for adenopathy.      Objective:   Physical Exam Constitutional:      General: She is not in acute distress.    Appearance: Normal appearance. She is well-developed and normal weight. She is not ill-appearing.  HENT:     Head: Normocephalic  and atraumatic.     Mouth/Throat:     Mouth: Mucous membranes are moist.  Eyes:     Conjunctiva/sclera: Conjunctivae normal.     Pupils: Pupils are equal, round, and reactive to light.  Cardiovascular:     Rate and Rhythm: Normal rate and regular rhythm.     Heart sounds: Normal heart sounds.  Pulmonary:     Effort: Pulmonary effort is normal.     Breath sounds: Normal breath sounds.  Abdominal:     General: Bowel sounds are normal. There is no distension.     Palpations: Abdomen is soft.     Tenderness: There is abdominal tenderness. There is no rebound.     Comments: No cva tenderness  Mild suprapubic tenderness  Musculoskeletal:     Cervical back: Normal range of motion and neck supple.  Lymphadenopathy:     Cervical: No cervical adenopathy.  Skin:    Findings: No rash.  Neurological:     Mental Status: She is alert.     Cranial Nerves: No cranial nerve deficit.     Motor: No weakness.  Psychiatric:        Mood and Affect: Mood normal.          Assessment & Plan:   Problem List Items Addressed This Visit       Genitourinary   Acute cystitis - Primary    Voiding symptoms for over a weeks and urine pos for leuk, rbc, protein Pend urine cx  Enc water intake  tx with macrobid (all to pcn and sulfa) inst to call if suddenly worse  ER precautions reviewd  Update if  not starting to improve in a week or if worsening         Relevant Orders   Urine Culture   Other Visit Diagnoses     Dysuria       Relevant Orders   POCT Urinalysis Dipstick (Automated) (Completed)

## 2021-05-05 NOTE — Telephone Encounter (Signed)
Patient calling want to discuss medication, Metoprolol

## 2021-05-05 NOTE — Telephone Encounter (Signed)
Spoke with patient and she wanted to know about macrobid and probiotic. Then husband took phone and said their question was about macrobid and metoprolol. Advised that I was not aware of any interactions and used micromedix which did not list any drug to drug interactions. He was appreciative for the information with no further questions.

## 2021-05-05 NOTE — Patient Instructions (Addendum)
Take macrobid for uti  Take the medicine with food   Drink fluids  If symptoms suddenly worsen let me know  We will let you know when the urine culture back   Take a probiotic (like align) over the counter to help prevent diarrhea

## 2021-05-05 NOTE — Assessment & Plan Note (Signed)
Voiding symptoms for over a weeks and urine pos for leuk, rbc, protein Pend urine cx  Enc water intake  tx with macrobid (all to pcn and sulfa) inst to call if suddenly worse  ER precautions reviewd  Update if not starting to improve in a week or if worsening

## 2021-05-06 LAB — URINE CULTURE
MICRO NUMBER:: 12047838
SPECIMEN QUALITY:: ADEQUATE

## 2021-05-08 ENCOUNTER — Telehealth: Payer: Self-pay | Admitting: *Deleted

## 2021-05-08 NOTE — Telephone Encounter (Signed)
Patient scheduled for an appointment with Dr.Tower 05/11/21 at 10:00 am.

## 2021-05-08 NOTE — Telephone Encounter (Signed)
PLEASE NOTE: All timestamps contained within this report are represented as [place] Standard Time. CONFIDENTIALTY NOTICE: This fax transmission is intended only for the addressee. It contains information that is legally privileged, confidential or otherwise protected from use or disclosure. If you are not the intended recipient, you are strictly prohibited from reviewing, disclosing, copying using or disseminating any of this information or taking any action in reliance on or regarding this information. If you have received this fax in error, please notify us immediately by telephone so that we can arrange for its return to [place]. Phone: [phone], Toll-Free: [phone], Fax: [phone] Page: 1 of 2 Call Id: [id number] [place] Night - Client TELEPHONE ADVICE RECORD [facility] Patient Name: Dominique Hall Gender: Female DOB: [birth year] Age: 86 Y 57 M 16 D Return Phone Number: [phone] (Primary), [phone] (Secondary) Address: [place] [address] Client [place] Night - Client Client Site [place] Physician Tower, Roque Lias - MD Contact Type Call Who Is Calling Patient / Member / Family / Caregiver Call Type Triage / Clinical Relationship To Patient Self Return Phone Number [phone] (Primary) Chief Complaint Urination Pain Reason for Call Symptomatic / Request for [place] states she is having difficulty in her genital area. Has urine pain when she uses the restroom. When she was seen in the office, they took a urine sample and placed her on antibiotics but she is still having some issues. Wants it re-checked. Translation No Nurse Assessment Nurse: Jimmye Norman, RN, [place] Date/Time (Eastern Time): 05/08/2021 8:48:02 AM Confirm and document reason for call. If symptomatic, describe symptoms. ---Caller states she is having pain with  urination. When she was seen in the office, they took a urine sample and placed her on antibiotics, but she is still having some issues. Wants to be seen. States she is still on the antibiotic. Does the patient have any new or worsening symptoms? ---Yes Will a triage be completed? ---Yes Related visit to physician within the last 2 weeks? ---Yes Does the PT have any chronic conditions? (i.e. diabetes, asthma, this includes High risk factors for pregnancy, etc.) ---No Is this a behavioral health or substance abuse call? ---No Guidelines Guideline Title Affirmed Question Affirmed Notes Nurse Date/Time Eilene Ghazi Time) Urinary Tract Infection on Antibiotic Follow-up Call - Female [1] Taking antibiotic > 72 hours (3 days) for UTI AND [2] painful urination or frequency not improved Jimmye Norman, Therapist, sports, [place] 05/08/2021 8:52:03 AM PLEASE NOTE: All timestamps contained within this report are represented as [place] Standard Time. CONFIDENTIALTY NOTICE: This fax transmission is intended only for the addressee. It contains information that is legally privileged, confidential or otherwise protected from use or disclosure. If you are not the intended recipient, you are strictly prohibited from reviewing, disclosing, copying using or disseminating any of this information or taking any action in reliance on or regarding this information. If you have received this fax in error, please notify us immediately by telephone so that we can arrange for its return to [place]. Phone: [phone], Toll-Free: [phone], Fax: [phone] Page: 2 of 2 Call Id: [id number] [place]. Time Eilene Ghazi Time) Disposition Final User 05/08/2021 8:55:32 AM See PCP within 24 Hours Yes Jimmye Norman, RN, Loree Fee Caller Disagree/Comply Comply Caller Understands Yes PreDisposition InappropriateToAsk Care Advice Given Per Guideline SEE PCP WITHIN 24 HOURS: * IF OFFICE WILL BE OPEN: You need to be examined within the next 24 hours. Call  your doctor (or NP/PA) when  the office opens and make an appointment. CONTINUE ANTIBIOTICS: * Continue taking your antibiotics according to the directions the doctor gave you. Referrals REFERRED TO PCP OFFICE

## 2021-05-11 ENCOUNTER — Ambulatory Visit (INDEPENDENT_AMBULATORY_CARE_PROVIDER_SITE_OTHER): Payer: Medicare Other | Admitting: Family Medicine

## 2021-05-11 ENCOUNTER — Other Ambulatory Visit: Payer: Self-pay

## 2021-05-11 ENCOUNTER — Encounter: Payer: Self-pay | Admitting: Family Medicine

## 2021-05-11 ENCOUNTER — Encounter: Payer: PPO | Admitting: Internal Medicine

## 2021-05-11 VITALS — BP 88/62 | HR 42 | Temp 97.0°F | Ht 65.0 in | Wt 156.0 lb

## 2021-05-11 DIAGNOSIS — R102 Pelvic and perineal pain unspecified side: Secondary | ICD-10-CM

## 2021-05-11 DIAGNOSIS — R300 Dysuria: Secondary | ICD-10-CM | POA: Diagnosis not present

## 2021-05-11 DIAGNOSIS — I10 Essential (primary) hypertension: Secondary | ICD-10-CM

## 2021-05-11 DIAGNOSIS — N3000 Acute cystitis without hematuria: Secondary | ICD-10-CM

## 2021-05-11 DIAGNOSIS — N952 Postmenopausal atrophic vaginitis: Secondary | ICD-10-CM | POA: Diagnosis not present

## 2021-05-11 LAB — POCT WET PREP (WET MOUNT): Trichomonas Wet Prep HPF POC: ABSENT

## 2021-05-11 LAB — POC URINALSYSI DIPSTICK (AUTOMATED)
Bilirubin, UA: NEGATIVE
Glucose, UA: NEGATIVE
Ketones, UA: NEGATIVE
Nitrite, UA: NEGATIVE
Protein, UA: NEGATIVE
Spec Grav, UA: 1.015 (ref 1.010–1.025)
Urobilinogen, UA: 0.2 E.U./dL
pH, UA: 6 (ref 5.0–8.0)

## 2021-05-11 MED ORDER — ESTRADIOL 0.1 MG/GM VA CREA: TOPICAL_CREAM | VAGINAL | 0 refills | Status: DC

## 2021-05-11 NOTE — Patient Instructions (Addendum)
Your blood pressure and pulse are on the low side Don't start the metoprolol yet  Call cardiology when you get home  I think you have atrophic vaginitis  Your swab / wet prep looked ok  Start back on the estrogen vaginal cream twice weekly   Update if not starting to improve in a week or if worsening    We will also culture urine

## 2021-05-11 NOTE — Assessment & Plan Note (Signed)
Urine culture read as contaminated but overall pt had much improvement with course of macrobid  Vaginal symptoms pre dated the abx but worsened after it

## 2021-05-11 NOTE — Assessment & Plan Note (Signed)
bp and pulse are low and pt is supposed to start metoprolol soon  Adv to  Hold off on that and contact her cardiologist  No symptoms or orthostasis

## 2021-05-11 NOTE — Progress Notes (Signed)
Subjective:    Patient ID: Dominique Hall, female    DOB: Aug 25, 1935, 86 y.o.   MRN: [id number]  This visit occurred during the SARS-CoV-2 public health emergency.  Safety protocols were in place, including screening questions prior to the visit, additional usage of staff PPE, and extensive cleaning of exam room while observing appropriate contact time as indicated for disinfecting solutions.   HPI 86 yo pt of Dr Silvio Pate presents with vaginal discomfort  Wt Readings from Last 3 Encounters:  05/11/21 156 lb (70.8 kg)  05/05/21 156 lb 2 oz (70.8 kg)  04/19/21 160 lb 4 oz (72.7 kg)   25.96 kg/m   Was seen on 6/24 for voiding symptoms for weeks and pos ua Tx with macrobid (all to pcn and sulfa)  Urine culture  showed mixed vaginal flora   Still having some symptoms   She has a h/o atrophic vaginitis  Used to use vaginal estrogen Is out of it  Tried to get a little bit out of the tube    Hurting in vaginal area  No itching  No discharge   No bleeding   No longer frequency or urgency  Drinks lots of water  A little dysuria  Results for orders placed or performed in visit on 05/11/21  POCT Urinalysis Dipstick (Automated)  Result Value Ref Range   Color, UA yellow    Clarity, UA clear    Glucose, UA Negative Negative   Bilirubin, UA negative    Ketones, UA negative    Spec Grav, UA 1.015 1.010 - 1.025   Blood, UA trace    pH, UA 6.0 5.0 - 8.0   Protein, UA Negative Negative   Urobilinogen, UA 0.2 0.2 or 1.0 E.U./dL   Nitrite, UA negative    Leukocytes, UA Small (1+) (A) Negative  POCT Wet Prep ([place])  Result Value Ref Range   Source Wet Prep POC vaginal    WBC, Wet Prep HPF POC mod    Bacteria Wet Prep HPF POC Moderate (A) Few   BACTERIA WET PREP MORPHOLOGY POC     Clue Cells Wet Prep HPF POC None None   Clue Cells Wet Prep [place] POC     Yeast Wet Prep HPF POC None None   KOH Wet Prep POC None None   Trichomonas Wet Prep HPF POC Absent Absent     Patient Active Problem List   Diagnosis Date Noted   Vaginal pain 05/11/2021   Acute cystitis 05/05/2021   DOE (dyspnea on exertion) 04/13/2021   Cognitive changes 04/13/2021   Lightheadedness 02/22/2021   Generalized osteoarthritis 05/06/2019   Dermatitis 05/22/2018   Left knee pain 12/14/2016   Solitary pulmonary nodule 12/22/2015   Cough 11/09/2015   Advance directive discussed with patient 03/17/2015   Atrophic vaginitis 01/29/2014   Cystocele, grade 1-2 01/29/2014   Fibrocystic breast changes 12/05/2012   Routine general medical examination at a health care facility 08/14/2011   Mild mood disorder ([place]) 04/15/2007   Essential hypertension, benign 04/15/2007   ALLERGIC RHINITIS 04/15/2007   GERD 04/15/2007   DIVERTICULOSIS, COLON 04/15/2007   OSTEOPENIA 04/15/2007   Past Medical History:  Diagnosis Date   Allergy    Arthritis    Chest pain    a. 07/2019 MV: No ischemia/infarct. Low risk.   Diverticulosis    Esophageal dysmotility    Esophageal ring    GERD (gastroesophageal reflux disease)    Hiatal hernia    HTN (hypertension)  IBS (irritable bowel syndrome)    Osteopenia    PAH (pulmonary artery hypertension) ([place])    a. 07/2019 Echo: EF 60-65%, impaired relaxation. Nl RV size/fxn. PASP 22mmHg. Mildly dil LA.   Palpitations    Renal disorder    Sleep disturbance    Past Surgical History:  Procedure Laterality Date   ABDOMINAL HYSTERECTOMY     APPENDECTOMY  1973   BREAST EXCISIONAL BIOPSY Right    CHOLECYSTECTOMY     ERCP N/A 11/22/2017   Procedure: ENDOSCOPIC RETROGRADE CHOLANGIOPANCREATOGRAPHY (ERCP);  Surgeon: Lucilla Lame, MD;  Location: [facility] ENDOSCOPY;  Service: Endoscopy;  Laterality: N/A;   ESOPHAGOGASTRODUODENOSCOPY  07-2006   with dilation   KIDNEY SURGERY  1970   lumpectomy  [phone]   benign right breast   SQUAMOUS CELL CARCINOMA EXCISION  6/12   Right leg--Dr Cresenciano Lick CELL CARCINOMA EXCISION  2012   right calf   TOTAL ABDOMINAL  HYSTERECTOMY W/ BILATERAL SALPINGOOPHORECTOMY  1973   Social History   Tobacco Use   Smoking status: Former    Pack years: 0.00   Smokeless tobacco: Never  Substance Use Topics   Alcohol use: Yes    Comment: occ   Drug use: No   Family History  Problem Relation Age of Onset   Heart attack Father    Stroke Mother    Melanoma Daughter    Allergies  Allergen Reactions   Penicillins     Has patient had a PCN reaction causing immediate rash, facial/tongue/throat swelling, SOB or lightheadedness with hypotension: Unknown Has patient had a PCN reaction causing severe rash involving mucus membranes or skin necrosis: Unknown Has patient had a PCN reaction that required hospitalization: Unknown Has patient had a PCN reaction occurring within the last 10 years: Unknown If all of the above answers are "NO", then may proceed with Cephalosporin use.   Rabeprazole Sodium     REACTION: constipated   Sulfonamide Derivatives     REACTION: unspecified   Cefuroxime     diarrhea   Current Outpatient Medications on File Prior to Visit  Medication Sig Dispense Refill   celecoxib (CELEBREX) 200 MG capsule Take 1 capsule (200 mg total) by mouth daily. 90 capsule 3   Cholecalciferol (VITAMIN D-3) 25 MCG (1000 UT) CAPS Take 1 capsule by mouth daily.     metoprolol tartrate (LOPRESSOR) 25 MG tablet Take 0.5 tablets (12.5 mg total) by mouth 2 (two) times daily. 90 tablet 3   nitrofurantoin, macrocrystal-monohydrate, (MACROBID) 100 MG capsule Take 1 capsule (100 mg total) by mouth 2 (two) times daily. 14 capsule 0   NON FORMULARY Immunity support gummies Takes 1 gummy daily.     NON FORMULARY Black elderberry gummy Takes 1 gummy qd.     Omega-3 Fatty Acids (FISH OIL PO) Take 1 capsule by mouth daily.     omeprazole (PRILOSEC) 20 MG capsule TAKE 1 CAPSULE(20 MG) BY MOUTH DAILY 90 capsule 3   vitamin C (ASCORBIC ACID) 500 MG tablet Take 500 mg by mouth daily.     No current facility-administered  medications on file prior to visit.     Review of Systems  Constitutional:  Negative for activity change, appetite change, fatigue, fever and unexpected weight change.  HENT:  Negative for congestion, ear pain, rhinorrhea, sinus pressure and sore throat.   Eyes:  Negative for pain, redness and visual disturbance.  Respiratory:  Negative for cough, shortness of breath and wheezing.   Cardiovascular:  Negative for chest pain  and palpitations.  Gastrointestinal:  Negative for abdominal pain, blood in stool, constipation and diarrhea.  Endocrine: Negative for polydipsia and polyuria.  Genitourinary:  Positive for vaginal pain. Negative for dysuria, frequency, urgency, vaginal bleeding and vaginal discharge.  Musculoskeletal:  Negative for arthralgias, back pain and myalgias.  Skin:  Negative for pallor and rash.  Allergic/Immunologic: Negative for environmental allergies.  Neurological:  Negative for dizziness, syncope and headaches.  Hematological:  Negative for adenopathy. Does not bruise/bleed easily.  Psychiatric/Behavioral:  Negative for decreased concentration and dysphoric mood. The patient is nervous/anxious.       Objective:   Physical Exam Exam conducted with a chaperone present.  Constitutional:      General: She is not in acute distress.    Appearance: Normal appearance. She is normal weight. She is not ill-appearing.  Eyes:     General: No scleral icterus.    Conjunctiva/sclera: Conjunctivae normal.     Pupils: Pupils are equal, round, and reactive to light.  Cardiovascular:     Rate and Rhythm: Regular rhythm. Bradycardia present.     Heart sounds: Normal heart sounds.  Pulmonary:     Effort: Pulmonary effort is normal. No respiratory distress.     Breath sounds: Normal breath sounds.  Abdominal:     General: Abdomen is flat. Bowel sounds are normal. There is no distension.     Palpations: There is no mass.     Tenderness: There is no abdominal tenderness.   Genitourinary:    Labia:        Right: No rash, tenderness, lesion or injury.        Left: No rash, tenderness, lesion or injury.      Urethra: No prolapse or urethral swelling.     Vagina: No vaginal discharge, bleeding or lesions.     Comments: Vaginal mucosa is atrophic appearing  No color changes or plaques or lesions  No mucosal breakdown or bleeding  No discharge Musculoskeletal:     Cervical back: Normal range of motion.  Lymphadenopathy:     Cervical: No cervical adenopathy.  Neurological:     Mental Status: She is alert.  Psychiatric:        Mood and Affect: Mood is anxious.          Assessment & Plan:   Problem List Items Addressed This Visit       Cardiovascular and Mediastinum   Essential hypertension, benign    bp and pulse are low and pt is supposed to start metoprolol soon  Adv to  Hold off on that and contact her cardiologist  No symptoms or orthostasis          Genitourinary   Atrophic vaginitis - Primary    Pt c/o vaginal discomfort without itching and discharge Recently took abx for uti  Noted pt ran out of estrace vaginal cream recently Normal wet prep today  Renewed estrace vaginal cream to use pea sized amount vaginally twice a week  inst to update if no improvement  No skin breakdown or signs of lichen sclerosis on exam         Acute cystitis    Urine culture read as contaminated but overall pt had much improvement with course of macrobid  Vaginal symptoms pre dated the abx but worsened after it       Relevant Orders   Urine Culture     Other   Vaginal pain    Suspect from atrophic vaginitis after running out of estrace  vaginal cream  Neg wet prep  Renewed her vaginal estrace        Relevant Orders   POCT Wet Prep [facility]) (Completed)   Other Visit Diagnoses     Dysuria       Relevant Orders   POCT Urinalysis Dipstick (Automated) (Completed)

## 2021-05-11 NOTE — Assessment & Plan Note (Signed)
Pt c/o vaginal discomfort without itching and discharge Recently took abx for uti  Noted pt ran out of estrace vaginal cream recently Normal wet prep today  Renewed estrace vaginal cream to use pea sized amount vaginally twice a week  inst to update if no improvement  No skin breakdown or signs of lichen sclerosis on exam

## 2021-05-11 NOTE — Assessment & Plan Note (Signed)
Suspect from atrophic vaginitis after running out of estrace vaginal cream  Neg wet prep  Renewed her vaginal estrace

## 2021-05-12 ENCOUNTER — Other Ambulatory Visit: Payer: Self-pay | Admitting: Nurse Practitioner

## 2021-05-12 ENCOUNTER — Ambulatory Visit (INDEPENDENT_AMBULATORY_CARE_PROVIDER_SITE_OTHER): Payer: Medicare Other

## 2021-05-12 ENCOUNTER — Telehealth: Payer: Self-pay | Admitting: Nurse Practitioner

## 2021-05-12 DIAGNOSIS — R55 Syncope and collapse: Secondary | ICD-10-CM

## 2021-05-12 LAB — URINE CULTURE
MICRO NUMBER:: 12070083
Result:: NO GROWTH
SPECIMEN QUALITY:: ADEQUATE

## 2021-05-12 NOTE — Telephone Encounter (Signed)
   I was alerted by Dr. Glori Bickers yesterday that Ms. Codispoti has been having bradycardia and hypotension.  I called and spoke w/ Ms. Hajjar today.  She says that since our visit in early June, she has been having intermittent "spells," where she becomes lightheaded/presyncopal.  Her husband has documented her HR/BP during these episodes, and rates are typically in the 40's w/ BPs in the 80's to 90's.  Ss resolve w/in seconds to a minute or so.  At PCP visit yesterday, she was noted to have HR of 42 w/ BP of 88/62.  Pt recently had a low risk myoview on 05/01/21.  During that study, she developed asymptomatic atrial tachycardia that subsided prior to the end of the study.  In that setting, I recommended initiating metoprolol 12.5mg  BID, however, she has not started this yet.  Given presyncope w/ bradycardia and hypotension, I recommended that she hold off on starting metoprolol until further notice.  I have arranged for Ms. Orrison to present today for placement of a 7 day Zio AT monitor.  Further recs pending monitoring info.  Murray Hodgkins, NP 05/12/2021, 2:32 PM

## 2021-05-14 NOTE — Telephone Encounter (Signed)
Will await the results of the zio monitor ---may need to consider pacemaker

## 2021-05-17 ENCOUNTER — Telehealth: Payer: Self-pay

## 2021-05-17 ENCOUNTER — Other Ambulatory Visit: Payer: Self-pay

## 2021-05-17 ENCOUNTER — Emergency Department: Payer: Medicare Other

## 2021-05-17 ENCOUNTER — Emergency Department
Admission: EM | Admit: 2021-05-17 | Discharge: 2021-05-17 | Disposition: A | Discharge status: Home / Self Care | Payer: Medicare Other | Attending: Emergency Medicine | Admitting: Emergency Medicine

## 2021-05-17 ENCOUNTER — Telehealth: Payer: Self-pay | Admitting: Nurse Practitioner

## 2021-05-17 DIAGNOSIS — K219 Gastro-esophageal reflux disease without esophagitis: Secondary | ICD-10-CM | POA: Insufficient documentation

## 2021-05-17 DIAGNOSIS — I10 Essential (primary) hypertension: Secondary | ICD-10-CM | POA: Insufficient documentation

## 2021-05-17 DIAGNOSIS — Z87891 Personal history of nicotine dependence: Secondary | ICD-10-CM | POA: Insufficient documentation

## 2021-05-17 DIAGNOSIS — R109 Unspecified abdominal pain: Secondary | ICD-10-CM | POA: Insufficient documentation

## 2021-05-17 LAB — CBC
HCT: 36.3 % (ref 36.0–46.0)
Hemoglobin: 12.6 g/dL (ref 12.0–15.0)
MCH: 29.9 pg (ref 26.0–34.0)
MCHC: 34.7 g/dL (ref 30.0–36.0)
MCV: 86 fL (ref 80.0–100.0)
Platelets: 154 10*3/uL (ref 150–400)
RBC: 4.22 MIL/uL (ref 3.87–5.11)
RDW: 13 % (ref 11.5–15.5)
WBC: 13.5 10*3/uL — ABNORMAL HIGH (ref 4.0–10.5)
nRBC: 0 % (ref 0.0–0.2)

## 2021-05-17 LAB — URINALYSIS, COMPLETE (UACMP) WITH MICROSCOPIC
Bacteria, UA: NONE SEEN
Bilirubin Urine: NEGATIVE
Glucose, UA: NEGATIVE mg/dL
Hgb urine dipstick: NEGATIVE
Ketones, ur: NEGATIVE mg/dL
Nitrite: NEGATIVE
Protein, ur: 30 mg/dL — AB
Specific Gravity, Urine: 1.024 (ref 1.005–1.030)
pH: 5 (ref 5.0–8.0)

## 2021-05-17 LAB — BASIC METABOLIC PANEL
Anion gap: 6 (ref 5–15)
BUN: 27 mg/dL — ABNORMAL HIGH (ref 8–23)
CO2: 26 mmol/L (ref 22–32)
Calcium: 9.3 mg/dL (ref 8.9–10.3)
Chloride: 108 mmol/L (ref 98–111)
Creatinine, Ser: 0.73 mg/dL (ref 0.44–1.00)
GFR, Estimated: >60 mL/min (ref >60)
Glucose, Bld: 110 mg/dL — ABNORMAL HIGH (ref 70–99)
Potassium: 3.6 mmol/L (ref 3.5–5.1)
Sodium: 140 mmol/L (ref 135–145)

## 2021-05-17 NOTE — Telephone Encounter (Signed)
Pts husband called and said at least 37' before can be seen for kidney stone at [facility] and pt is in a lot of pain and pts husband wants to know if we can speed up the process for pt to be seen since she is in a lot of pain. Advised Mr Bardon that the ED will triage pt and then pt should be eval with any needed testing. Mr Boruff voiced understanding and also pt has appt with Dr Rockey Situ on 05/18/21 and Mr Hott has not been able to get thru to Dr Gwenyth Ober office about appt. I did warm transfer to Dr Donivan Scull office and Mr Polinsky will be speaking with Lovena Le. Sending FYI to Dr Silvio Pate.

## 2021-05-17 NOTE — Telephone Encounter (Signed)
Patients husband calling in to cancel appt for 7/7 due to be in the hospital with a kidney stone. Husband states wife is still on a monitor and that he would not like to reschedule today.

## 2021-05-17 NOTE — ED Provider Notes (Signed)
[facility] Emergency Department Provider Note  Time seen: 10:47 PM  I have reviewed the triage vital signs and the nursing notes.   HISTORY  Chief Complaint Flank Pain   HPI Dominique Hall is a 86 y.o. female with a past medical history of arthritis, gastric reflux, hypertension, presents to the emergency department for right flank pain.  According to the patient this afternoon she developed moderate dull pain in her right flank.  Patient states a history of multiple kidney stones in the past and thought as what she could be experiencing.  Patient came to the emergency department but states upon arrival the pain dissipated and she has not had any pain since.  Patient denies any nausea vomiting or diarrhea.  Denies any fever or dysuria.  States she did have a urinary tract infection approximately 2 weeks ago and completed a course of antibiotics 1 week ago.   Past Medical History:  Diagnosis Date   Allergy    Arthritis    Chest pain    a. 07/2019 MV: No ischemia/infarct. Low risk.   Diverticulosis    Esophageal dysmotility    Esophageal ring    GERD (gastroesophageal reflux disease)    Hiatal hernia    HTN (hypertension)    IBS (irritable bowel syndrome)    Osteopenia    PAH (pulmonary artery hypertension) ([place])    a. 07/2019 Echo: EF 60-65%, impaired relaxation. Nl RV size/fxn. PASP 40mmHg. Mildly dil LA.   Palpitations    Renal disorder    Sleep disturbance     Patient Active Problem List   Diagnosis Date Noted   Vaginal pain 05/11/2021   Acute cystitis 05/05/2021   DOE (dyspnea on exertion) 04/13/2021   Cognitive changes 04/13/2021   Lightheadedness 02/22/2021   Generalized osteoarthritis 05/06/2019   Dermatitis 05/22/2018   Left knee pain 12/14/2016   Solitary pulmonary nodule 12/22/2015   Cough 11/09/2015   Advance directive discussed with patient 03/17/2015   Atrophic vaginitis 01/29/2014   Cystocele, grade 1-2 01/29/2014    Fibrocystic breast changes 12/05/2012   Routine general medical examination at a health care facility 08/14/2011   Mild mood disorder ([place]) 04/15/2007   Essential hypertension, benign 04/15/2007   ALLERGIC RHINITIS 04/15/2007   GERD 04/15/2007   DIVERTICULOSIS, COLON 04/15/2007   OSTEOPENIA 04/15/2007    Past Surgical History:  Procedure Laterality Date   ABDOMINAL HYSTERECTOMY     APPENDECTOMY  1973   BREAST EXCISIONAL BIOPSY Right    CHOLECYSTECTOMY     ERCP N/A 11/22/2017   Procedure: ENDOSCOPIC RETROGRADE CHOLANGIOPANCREATOGRAPHY (ERCP);  Surgeon: Lucilla Lame, MD;  Location: [facility] ENDOSCOPY;  Service: Endoscopy;  Laterality: N/A;   ESOPHAGOGASTRODUODENOSCOPY  07-2006   with dilation   KIDNEY SURGERY  1970   lumpectomy  [phone]   benign right breast   SQUAMOUS CELL CARCINOMA EXCISION  6/12   Right leg--Dr Cresenciano Lick CELL CARCINOMA EXCISION  2012   right calf   TOTAL ABDOMINAL HYSTERECTOMY W/ BILATERAL SALPINGOOPHORECTOMY  1973    Prior to Admission medications   Medication Sig Start Date End Date Taking? Authorizing Provider  celecoxib (CELEBREX) 200 MG capsule Take 1 capsule (200 mg total) by mouth daily. 12/01/20   Venia Carbon, MD  Cholecalciferol (VITAMIN D-3) 25 MCG (1000 UT) CAPS Take 1 capsule by mouth daily.    [provider]  estradiol (ESTRACE VAGINAL) 0.1 MG/GM vaginal cream Apply a pea sized amount to vagina twice weekly 05/11/21  Tower, Wynelle Fanny, MD  nitrofurantoin, macrocrystal-monohydrate, (MACROBID) 100 MG capsule Take 1 capsule (100 mg total) by mouth 2 (two) times daily. 05/05/21   Tower, Wynelle Fanny, MD  NON FORMULARY Immunity support gummies Takes 1 gummy daily.    [provider]  NON FORMULARY Black elderberry gummy Takes 1 gummy qd.    [provider]  Omega-3 Fatty Acids (FISH OIL PO) Take 1 capsule by mouth daily.    [provider]  omeprazole (PRILOSEC) 20 MG capsule TAKE 1 CAPSULE(20 MG) BY MOUTH DAILY 04/17/21    Venia Carbon, MD  vitamin C (ASCORBIC ACID) 500 MG tablet Take 500 mg by mouth daily.    [provider]    Allergies  Allergen Reactions   Penicillins     Has patient had a PCN reaction causing immediate rash, facial/tongue/throat swelling, SOB or lightheadedness with hypotension: Unknown Has patient had a PCN reaction causing severe rash involving mucus membranes or skin necrosis: Unknown Has patient had a PCN reaction that required hospitalization: Unknown Has patient had a PCN reaction occurring within the last 10 years: Unknown If all of the above answers are "NO", then may proceed with Cephalosporin use.   Rabeprazole Sodium     REACTION: constipated   Sulfonamide Derivatives     REACTION: unspecified   Cefuroxime     diarrhea    Family History  Problem Relation Age of Onset   Heart attack Father    Stroke Mother    Melanoma Daughter     Social History Social History   Tobacco Use   Smoking status: Former    Pack years: 0.00   Smokeless tobacco: Never  Substance Use Topics   Alcohol use: Yes    Comment: occ   Drug use: No    Review of Systems Constitutional: Negative for fever. Cardiovascular: Negative for chest pain. Respiratory: Negative for shortness of breath. Gastrointestinal: Right flank pain, now resolved.  Negative for nausea vomiting or diarrhea. Genitourinary: Negative for urinary compaints Musculoskeletal: Negative for musculoskeletal complaints Neurological: Negative for headache All other ROS negative  ____________________________________________   PHYSICAL EXAM:  VITAL SIGNS: ED Triage Vitals  Enc Vitals Group     BP 05/17/21 1716 (!) 139/57     Pulse Rate 05/17/21 1716 85     Resp 05/17/21 1716 18     Temp 05/17/21 1716 98.2 F (36.8 C)     Temp Source 05/17/21 1716 Oral     SpO2 --      Weight 05/17/21 1629 125 lb (56.7 kg)     Height 05/17/21 1629 5\' 5"  (1.651 m)     Head Circumference --      Peak Flow --       Pain Score 05/17/21 1629 10     Pain Loc --      Pain Edu? --      Excl. in [place]? --    Constitutional: Alert and oriented. Well appearing and in no distress. Eyes: Normal exam ENT      Head: Normocephalic and atraumatic.      Mouth/Throat: Mucous membranes are moist. Cardiovascular: Normal rate, regular rhythm.  Respiratory: Normal respiratory effort without tachypnea nor retractions. Breath sounds are clear  Gastrointestinal: Soft and nontender. No distention.   Musculoskeletal: Nontender with normal range of motion in all extremities.  Neurologic:  Normal speech and language. No gross focal neurologic deficits  Skin:  Skin is warm, dry and intact.  Psychiatric: Mood and affect are  normal.       RADIOLOGY  Bilateral renal calculi without ureteral calculi.  No other acute abnormality.  ____________________________________________   INITIAL IMPRESSION / ASSESSMENT AND PLAN / ED COURSE  Pertinent labs & imaging results that were available during my care of the patient were reviewed by me and considered in my medical decision making (see chart for details).   Patient presents emergency department for right flank pain earlier this afternoon that is now resolved.  Patient denies any pain over the past 6 or 7 hours.  Patient denies any urinary symptoms.  Patient states the pain felt like she needed to "fart."  And has now since resolved.  Given the patient's reassuring work-up besides leukocytes within her urine although no bacteria seen on microscopy we will send a urine culture as a precaution but as the patient just finished a course of antibiotics we will hold off on treatment at this time.  Patient agreeable to plan of care.  Provided my typical abdominal pain return precautions.   Kalifa Cadden was evaluated in Emergency Department on 05/17/2021 for the symptoms described in the history of present illness. She was evaluated in the context of the global COVID-19 pandemic, which  necessitated consideration that the patient might be at risk for infection with the SARS-CoV-2 virus that causes COVID-19. Institutional protocols and algorithms that pertain to the evaluation of patients at risk for COVID-19 are in a state of rapid change based on information released by regulatory bodies including the CDC and federal and state organizations. These policies and algorithms were followed during the patient's care in the ED.  ____________________________________________   FINAL CLINICAL IMPRESSION(S) / ED DIAGNOSES  Abdominal pain   Harvest Dark, MD 05/17/21 2250

## 2021-05-17 NOTE — ED Triage Notes (Signed)
Pt c/o right flank pain for the past week and in the past 24hrs worsening with N/V. States she has a hx of kidney stones and this feels the same

## 2021-05-18 ENCOUNTER — Encounter: Payer: Self-pay | Admitting: Nurse Practitioner

## 2021-05-18 ENCOUNTER — Ambulatory Visit (INDEPENDENT_AMBULATORY_CARE_PROVIDER_SITE_OTHER): Payer: Medicare Other | Admitting: Nurse Practitioner

## 2021-05-18 ENCOUNTER — Ambulatory Visit: Payer: 59 | Admitting: Nurse Practitioner

## 2021-05-18 VITALS — BP 157/76 | HR 68 | Ht 65.0 in | Wt 156.0 lb

## 2021-05-18 DIAGNOSIS — I10 Essential (primary) hypertension: Secondary | ICD-10-CM | POA: Diagnosis not present

## 2021-05-18 DIAGNOSIS — R55 Syncope and collapse: Secondary | ICD-10-CM

## 2021-05-18 DIAGNOSIS — I471 Supraventricular tachycardia: Secondary | ICD-10-CM

## 2021-05-18 DIAGNOSIS — R079 Chest pain, unspecified: Secondary | ICD-10-CM | POA: Diagnosis not present

## 2021-05-18 NOTE — Telephone Encounter (Signed)
Patient called back in and will keep appointment for today.

## 2021-05-18 NOTE — Patient Instructions (Signed)
Medication Instructions:  No changes at this time.  *If you need a refill on your cardiac medications before your next appointment, please call your pharmacy*   Lab Work: None  If you have labs (blood work) drawn today and your tests are completely normal, you will receive your results only by: [place] (if you have MyChart) OR A paper copy in the mail If you have any lab test that is abnormal or we need to change your treatment, we will call you to review the results.   Testing/Procedures: None   Follow-Up: At [facility], you and your health needs are our priority.  As part of our continuing mission to provide you with exceptional heart care, we have created designated Provider Care Teams.  These Care Teams include your primary Cardiologist (physician) and Advanced Practice Providers (APPs -  Physician Assistants and Nurse Practitioners) who all work together to provide you with the care you need, when you need it.   Your next appointment:   June 21, 2021 at 3:00 pm with Dr. Quentin Ore  The format for your next appointment:   In Person  Provider:   Lars Mage, MD

## 2021-05-18 NOTE — Telephone Encounter (Signed)
Left message to call office to find out how pt was doing after ER visit yesterday.

## 2021-05-18 NOTE — Progress Notes (Signed)
Office Visit    Patient Name: Dominique Hall Date of Encounter: 05/18/2021  Primary Care Provider:  Venia Carbon, MD Primary Cardiologist:  Ida Rogue, MD  Chief Complaint    86 year old female with a history of chest pain and recent normal stress test, palpitations, hypertension, pulmonary hypertension, and GERD, who presents for follow-up related to presyncope.  Past Medical History    Past Medical History:  Diagnosis Date   Allergy    Arthritis    Chest pain    a. 07/2019 MV: No ischemia/infarct. Low risk; b. 04/2021 MV: No isch/scar. EF 60%. No significant cor Ca2+. Developed atrial tachycardia during study.   Diverticulosis    Esophageal dysmotility    Esophageal ring    GERD (gastroesophageal reflux disease)    Hiatal hernia    HTN (hypertension)    IBS (irritable bowel syndrome)    Osteopenia    PAH (pulmonary artery hypertension) ([place])    a. 07/2019 Echo: EF 60-65%, impaired relaxation. Nl RV size/fxn. PASP 67mmHg. Mildly dil LA.   Palpitations    Pre-syncope    Renal disorder    Sleep disturbance    Past Surgical History:  Procedure Laterality Date   ABDOMINAL HYSTERECTOMY     APPENDECTOMY  1973   BREAST EXCISIONAL BIOPSY Right    CHOLECYSTECTOMY     ERCP N/A 11/22/2017   Procedure: ENDOSCOPIC RETROGRADE CHOLANGIOPANCREATOGRAPHY (ERCP);  Surgeon: Lucilla Lame, MD;  Location: [facility] ENDOSCOPY;  Service: Endoscopy;  Laterality: N/A;   ESOPHAGOGASTRODUODENOSCOPY  07-2006   with dilation   KIDNEY SURGERY  1970   lumpectomy  [phone]   benign right breast   SQUAMOUS CELL CARCINOMA EXCISION  6/12   Right leg--Dr Cresenciano Lick CELL CARCINOMA EXCISION  2012   right calf   TOTAL ABDOMINAL HYSTERECTOMY W/ BILATERAL SALPINGOOPHORECTOMY  1973    Allergies  Allergies  Allergen Reactions   Penicillins     Has patient had a PCN reaction causing immediate rash, facial/tongue/throat swelling, SOB or lightheadedness with hypotension: Unknown Has patient  had a PCN reaction causing severe rash involving mucus membranes or skin necrosis: Unknown Has patient had a PCN reaction that required hospitalization: Unknown Has patient had a PCN reaction occurring within the last 10 years: Unknown If all of the above answers are "NO", then may proceed with Cephalosporin use.   Rabeprazole Sodium     REACTION: constipated   Sulfonamide Derivatives     REACTION: unspecified   Cefuroxime     diarrhea    History of Present Illness    86 year old female with the above past medical history including chest pain, palpitations, hypertension, pulmonary hypertension, and GERD.  She previously underwent evaluation for chest pain in 2020 with stress test showing no evidence of ischemia or infarct.  Echocardiogram at that time showed an EF of 60-65% with impaired relaxation, normal RV function, and pulmonary arterial systolic pressure 42 mmHg.  She previously had issues with orthostatic lightheadedness and initially improved off of hydrochlorothiazide.  She did eventually resume this but it was subsequently discontinued.  She was seen in clinic in early June with complaints of exertional upper chest burning associated with dyspnea.  Stress testing was undertaken and showed no evidence of ischemia or infarct.  She was noted to have atrial tachycardia during the study which converted spontaneously back to sinus rhythm before the end of the study.  Given finding of atrial tachycardia, I recommended initiation of metoprolol 12.5 mg twice daily however,  patient did not start.    At primary care follow-up on June 24, patient reported "spells" of lightheadedness and presyncope.  During these episodes, her husband documented heart rates in the 59s and blood pressures sometimes in the 80s.  Symptoms were occurring a few times a week, lasting about 2 minutes, and resolving spontaneously.  In light of these new symptoms, I reached out to this [place] on June 24 and arrange for a 14-day  Zio monitor, which she has been wearing.  Since placing the monitor, she has not any prolonged episodes of presyncope.  On July 3, she had an episode of palpitations with presyncope which was fairly short-lived and evaluation of monitoring available up to this point did show a brief episode of SVT.  Yesterday morning at approximately 945, she triggered the monitor secondary to presyncope.  She said it felt like the beginning of a "spell."  The triggered event was associated with sinus rhythm followed by what appears to be a sinus arrest, a junctional escape, and then sinus bradycardia with rates in the 40s for just a matter of a few seconds before stabilization of rates back into the 70s.  Patient notes that since placing the monitor, her spells have not been nearly as severe or as frequent as what was occurring prior to monitor placement.  She denies chest pain, dyspnea, PND, orthopnea, syncope, edema, or early satiety.  Home Medications    Prior to Admission medications   Medication Sig Start Date End Date Taking? Authorizing Provider  celecoxib (CELEBREX) 200 MG capsule Take 1 capsule (200 mg total) by mouth daily. 12/01/20  Yes Venia Carbon, MD  Cholecalciferol (VITAMIN D-3) 25 MCG (1000 UT) CAPS Take 1 capsule by mouth daily.   Yes [provider]  estradiol (ESTRACE VAGINAL) 0.1 MG/GM vaginal cream Apply a pea sized amount to vagina twice weekly 05/11/21  Yes Tower, Wynelle Fanny, MD  NON FORMULARY Immunity support gummies Takes 1 gummy daily.   Yes [provider]  NON FORMULARY Black elderberry gummy Takes 1 gummy qd.   Yes [provider]  Omega-3 Fatty Acids (FISH OIL PO) Take 1 capsule by mouth daily.   Yes [provider]  omeprazole (PRILOSEC) 20 MG capsule TAKE 1 CAPSULE(20 MG) BY MOUTH DAILY 04/17/21  Yes Venia Carbon, MD  vitamin C (ASCORBIC ACID) 500 MG tablet Take 500 mg by mouth daily.   Yes [provider]    Review of Systems     Presyncopal spells as outlined above.  She has not had any syncope.  She denies chest pain, dyspnea, PND, orthopnea, edema, or early satiety.  All other systems reviewed and are otherwise negative except as noted above.  Physical Exam    VS:  BP (!) 157/76 (BP Location: Left Arm, Patient Position: Sitting, Cuff Size: Normal)   Pulse 68   Ht 5\' 5"  (1.651 m)   Wt 156 lb (70.8 kg)   SpO2 97%   BMI 25.96 kg/m  , BMI Body mass index is 25.96 kg/m. GEN: Well nourished, well developed, in no acute distress. HEENT: normal. Neck: Supple, no JVD, carotid bruits, or masses. Cardiac: RRR, no murmurs, rubs, or gallops. No clubbing, cyanosis, edema.  Radials/PT 2+ and equal bilaterally.  Respiratory:  Respirations regular and unlabored, clear to auscultation bilaterally. GI: Soft, nontender, nondistended, BS + x 4. MS: no deformity or atrophy. Skin: warm and dry, no rash. Neuro:  Strength and sensation are intact. Psych: Normal affect.  Accessory Clinical Findings    ECG personally reviewed by me today -sinus arrhythmia, 68- no acute changes.  Lab Results  Component Value Date   WBC 13.5 (H) 05/17/2021   HGB 12.6 05/17/2021   HCT 36.3 05/17/2021   MCV 86.0 05/17/2021   PLT 154 05/17/2021   Lab Results  Component Value Date   CREATININE 0.73 05/17/2021   BUN 27 (H) 05/17/2021   NA 140 05/17/2021   K 3.6 05/17/2021   CL 108 05/17/2021   CO2 26 05/17/2021   Lab Results  Component Value Date   ALT 11 04/14/2021   AST 14 04/14/2021   ALKPHOS 65 04/14/2021   BILITOT 0.7 04/14/2021   Lab Results  Component Value Date   CHOL 152 02/22/2014   HDL 63.00 02/22/2014   LDLCALC 69 02/22/2014   TRIG 98.0 02/22/2014   CHOLHDL 2 02/22/2014    Assessment & Plan    1.  Presyncope: Since her clinic visit in June, patient describes multiple episodes of sudden onset of presyncope.  These were occurring a few times a week and typically lasting about 2 minutes prior to resolving  spontaneously.  Her husband has checked her heart rate and blood pressure during the spells and reports findings of heart rates in the 40s with blood pressures in the 80s to 90s.  I placed a Zio monitor about a week ago and she has had at least 2 episodes of presyncope during the monitoring period thus far.  1 was associated with palpitations and a brief run of SVT on July 3 while a second, which felt more similar to previous episodes of presyncope, was associated with sinus arrest with junctional escape and subsequently sinus bradycardia followed by resumption of sinus rhythm.  The entire episode lasted just a few seconds.  We discussed monitoring results that are available up to this point.  She will wear the monitor for an additional week.  I do have concern related to tachybrady syndrome/sick sinus syndrome given symptoms and findings thus far.  She is not on any AV nodal blocking agents.  I will preemptively refer to EP while we await the additional week of monitoring.  I have advised the patient should avoid driving given unpredictable nature of symptoms.  2.  PSVT/atrial tachycardia: She had a prolonged episode during her recent [place].  She was symptomatic during the test though it was difficult to discern whether or not this was related to tachycardia or simply related to administration of Lexiscan.  Symptoms and rhythm subsided within 5 minutes.  Initial plan was to add low-dose metoprolol but she never started this and in light of concerns related to syncope and possibly sick sinus syndrome, will continue to hold off on AV nodal blocking agents.  3.  Essential hypertension: Blood pressure is elevated today.  As she has been having intermittent hypotension documented at home and as previous blood pressure trends typically run more normally, I will hold off on making any adjustments, especially in light of #1.  4.  Chest pain:  Recent neg MV. No current complaints.  Previously prescribed  isosorbide however, she is not taking.  Of note, no significant coronary calcium noted on CT imaging at the time of stress testing.  5.  Disposition: Follow-up Zio monitor to completion.  I will arrange for EP follow-up.  Murray Hodgkins, NP 05/18/2021, 4:11 PM

## 2021-05-18 NOTE — Telephone Encounter (Signed)
Was seen in the ER yesterday and her pain resolved. (GI vs stone that passed) Please check on her to make sure she is still doing okay

## 2021-05-19 LAB — URINE CULTURE: Culture: <10000 — AB

## 2021-05-19 NOTE — Telephone Encounter (Signed)
Per patient's husband, Dominique Hall, patient is doing fine. Ruled out that it was not a kidney stone. Patient is back to normal. No concerns at this time.

## 2021-05-19 NOTE — Telephone Encounter (Signed)
I am glad to hear that he is doing better

## 2021-05-19 NOTE — Telephone Encounter (Signed)
Thank you :)

## 2021-05-24 ENCOUNTER — Other Ambulatory Visit: Payer: 59

## 2021-06-02 ENCOUNTER — Encounter: Payer: Medicare Other | Admitting: Internal Medicine

## 2021-06-05 ENCOUNTER — Telehealth: Payer: Self-pay | Admitting: Internal Medicine

## 2021-06-05 ENCOUNTER — Telehealth: Payer: Self-pay | Admitting: Nurse Practitioner

## 2021-06-05 MED ORDER — METOPROLOL TARTRATE 25 MG PO TABS: 25.0000 mg | ORAL_TABLET | Freq: Two times a day (BID) | ORAL | 1 refills | Status: DC

## 2021-06-05 NOTE — Telephone Encounter (Signed)
Spoke with patient and reviewed recommendations from provider. Advised we would start Metoprolol tartrate 25 mg twice a day. She reports having tons of these medication from June. Her label states 1/2 tablet but reviewed she should take whole tablet. Discussed how this medication can help with her blood pressure and heart rates. Also scheduled her to come in and see APP in 4 weeks. She was pleased to know that she would see Ignacia Bayley NP. Instructed her to monitor blood pressures twice a day and 2 hours after her medications and to keep log to bring in to her appointment. She verbalized understanding of our conversation, agreement with plan, and had no further questions at this time.       Theora Gianotti, NP  1 hour ago (11:05 AM)    I've reviewed rhythm strips available from prior Zio - last worn 7/15 - final report pending.  She was noted to have runs of SVT, which resulted in triggered events.  No persistent bradycardia during waking hours.  Please have her start metoprolol '25mg'$  BID.  This should help w/ both BP and HR.  F/u in 2-4 wks.

## 2021-06-05 NOTE — Telephone Encounter (Signed)
Pt c/o BP issue: STAT if pt c/o blurred vision, one-sided weakness or slurred speech  1. What are your last 5 BP readings? 170/90  2. Are you having any other symptoms (ex. Dizziness, headache, blurred vision, passed out)? Lightheaded, and raised BP  3. What is your BP issue? Higher than normal

## 2021-06-05 NOTE — Telephone Encounter (Signed)
Spoke with patient and she reports yesterday her blood pressures were elevated and uncomfortable. She also felt that her heart was fluttering and racing with only relief when sitting down. She did not have any readings but the 170/80. She really wanted to come in and see Dr. Rockey Situ but advised that he does not have anything open until September. Discussed that I can place her on cancellation list and forward this note with her concerns over to APP and Dr. Rockey Situ for review. She was appreciative for the call back with no further questions at this time.  Pulled up Zio report to see if her fluttering was captured. No available daily report for yesterday. Last noted report was from 7/13 showing SVT with [facility] as well.

## 2021-06-05 NOTE — Chronic Care Management (AMB) (Signed)
  Chronic Care Management   Note  06/05/2021 Name: Tabbatha Floss MRN: [id number] DOB: 10/18/1935  Corinne Carrigan is a 86 y.o. year old female who is a primary care patient of Venia Carbon, MD. I reached out to [name] by phone today in response to a referral sent by Ms. Senaida Lange Haye's PCP, Venia Carbon, MD.   Ms. Sperbeck was given information about Chronic Care Management services today including:  CCM service includes personalized support from designated clinical staff supervised by her physician, including individualized plan of care and coordination with other care providers 24/7 contact phone numbers for assistance for urgent and routine care needs. Service will only be billed when office clinical staff spend 20 minutes or more in a month to coordinate care. Only one practitioner may furnish and bill the service in a calendar month. The patient may stop CCM services at any time (effective at the end of the month) by phone call to the office staff.   Jari Pigg  verbally agreed to assistance and services provided by embedded care coordination/care management team today.  Follow up plan:   Tatjana Secretary/administrator

## 2021-06-13 ENCOUNTER — Ambulatory Visit
Admission: RE | Admit: 2021-06-13 | Discharge: 2021-06-13 | Disposition: A | Discharge status: Home / Self Care | Payer: Medicare Other | Source: Ambulatory Visit | Attending: Internal Medicine | Admitting: Internal Medicine

## 2021-06-13 ENCOUNTER — Other Ambulatory Visit: Payer: Self-pay

## 2021-06-13 DIAGNOSIS — Z1231 Encounter for screening mammogram for malignant neoplasm of breast: Secondary | ICD-10-CM

## 2021-06-21 ENCOUNTER — Ambulatory Visit (INDEPENDENT_AMBULATORY_CARE_PROVIDER_SITE_OTHER): Payer: Medicare Other | Admitting: Cardiology

## 2021-06-21 ENCOUNTER — Other Ambulatory Visit: Payer: Self-pay

## 2021-06-21 ENCOUNTER — Encounter: Payer: Self-pay | Admitting: Cardiology

## 2021-06-21 VITALS — BP 120/80 | HR 70 | Ht 65.0 in | Wt 155.0 lb

## 2021-06-21 DIAGNOSIS — I495 Sick sinus syndrome: Secondary | ICD-10-CM | POA: Diagnosis not present

## 2021-06-21 DIAGNOSIS — I471 Supraventricular tachycardia, unspecified: Secondary | ICD-10-CM

## 2021-06-21 DIAGNOSIS — R002 Palpitations: Secondary | ICD-10-CM

## 2021-06-21 NOTE — Patient Instructions (Addendum)
Medication Instructions:  Your physician recommends that you continue on your current medications as directed. Please refer to the Current Medication list given to you today.  ** OK to take Metoprolol Tartrate '25mg'$  - 1 tablet twice daily as prescribed.  ** May take Metoprolol Tartrate '25mg'$  - 12.'5mg'$  (1/2 tablet by mouth for breakthrough fast heart rate.  *If you need a refill on your cardiac medications before your next appointment, please call your pharmacy*   Lab Work: None ordered.  If you have labs (blood work) drawn today and your tests are completely normal, you will receive your results only by: [place] (if you have MyChart) OR A paper copy in the mail If you have any lab test that is abnormal or we need to change your treatment, we will call you to review the results.   Testing/Procedures: None ordered.    Follow-Up: At [facility], you and your health needs are our priority.  As part of our continuing mission to provide you with exceptional heart care, we have created designated Provider Care Teams.  These Care Teams include your primary Cardiologist (physician) and Advanced Practice Providers (APPs -  Physician Assistants and Nurse Practitioners) who all work together to provide you with the care you need, when you need it.  We recommend signing up for the patient portal called "MyChart".  Sign up information is provided on this After Visit Summary.  MyChart is used to connect with patients for Virtual Visits (Telemedicine).  Patients are able to view lab/test results, encounter notes, upcoming appointments, etc.  Non-urgent messages can be sent to your provider as well.   To learn more about what you can do with MyChart, go to [email or web address].    Your next appointment:   Follow up with Dr Quentin Ore as needed

## 2021-06-21 NOTE — Progress Notes (Signed)
Electrophysiology Office Note:    Date:  06/21/2021   ID:  Dominique Hall, DOB 1935/04/30, MRN [id number]  PCP:  Venia Carbon, MD  [facility] Cardiologist:  Ida Rogue, MD  [facility] Electrophysiologist:  Vickie Epley, MD   Referring MD: Venia Carbon, MD   Chief Complaint: Tachycardia-bradycardia syndrome  History of Present Illness:    Dominique Hall is a 86 y.o. female who presents for an evaluation of tachycardia-bradycardia syndrome at the request of Jorja Loa, in the. Their medical history includes hypertension, irritable bowel syndrome, pulmonary arterial hypertension, palpitations.  The patient was last seen by Jorja Loa on May 18, 2021.  She had previously had a stress test during which she had an episode of atrial tachycardia shortly after the Lexiscan was administered.  Prior to seeing his burden, she was having episodes and palpitations that corresponded to versus atrial tachycardia on her ZIO monitor.  These have dramatically improved since starting the metoprolol 25 mg by mouth twice daily.  She is also not had significant bradycardic episodes in on this dose of metoprolol  She is with her husband today in clinic.  Past Medical History:  Diagnosis Date   Allergy    Arthritis    Chest pain    a. 07/2019 MV: No ischemia/infarct. Low risk; b. 04/2021 MV: No isch/scar. EF 60%. No significant cor Ca2+. Developed atrial tachycardia during study.   Diverticulosis    Esophageal dysmotility    Esophageal ring    GERD (gastroesophageal reflux disease)    Hiatal hernia    HTN (hypertension)    IBS (irritable bowel syndrome)    Osteopenia    PAH (pulmonary artery hypertension) ([place])    a. 07/2019 Echo: EF 60-65%, impaired relaxation. Nl RV size/fxn. PASP 54mHg. Mildly dil LA.   Palpitations    Pre-syncope    Renal disorder    Sleep disturbance     Past Surgical History:  Procedure Laterality Date   ABDOMINAL HYSTERECTOMY      APPENDECTOMY  1973   BREAST EXCISIONAL BIOPSY Right    CHOLECYSTECTOMY     ERCP N/A 11/22/2017   Procedure: ENDOSCOPIC RETROGRADE CHOLANGIOPANCREATOGRAPHY (ERCP);  Surgeon: WLucilla Lame MD;  [facility]  Service: Endoscopy;  Laterality: N/A;   ESOPHAGOGASTRODUODENOSCOPY  07-2006   with dilation   KIDNEY SURGERY  1970   lumpectomy  [phone]  benign right breast   SQUAMOUS CELL CARCINOMA EXCISION  6/12   Right leg--Dr ECresenciano LickCELL CARCINOMA EXCISION  2012   right calf   TOTAL ABDOMINAL HYSTERECTOMY W/ BILATERAL SALPINGOOPHORECTOMY  1973    Current Medications: Current Meds  Medication Sig   celecoxib (CELEBREX) 200 MG capsule Take 1 capsule (200 mg total) by mouth daily.   Cholecalciferol (VITAMIN D-3) 25 MCG (1000 UT) CAPS Take 1 capsule by mouth daily.   estradiol (ESTRACE VAGINAL) 0.1 MG/GM vaginal cream Apply a pea sized amount to vagina twice weekly   metoprolol tartrate (LOPRESSOR) 25 MG tablet Take 1 tablet (25 mg total) by mouth 2 (two) times daily.   NON FORMULARY Immunity support gummies Takes 1 gummy daily.   NON FORMULARY Black elderberry gummy Takes 1 gummy qd.   Omega-3 Fatty Acids (FISH OIL PO) Take 1 capsule by mouth daily.   omeprazole (PRILOSEC) 20 MG capsule TAKE 1 CAPSULE(20 MG) BY MOUTH DAILY   vitamin C (ASCORBIC ACID) 500 MG tablet Take 500 mg by mouth daily.     Allergies:  Penicillins, Rabeprazole sodium, Sulfonamide derivatives, and Cefuroxime   Social History   Socioeconomic History   Marital status: Married    Spouse name: Not on file   Number of children: 2   Years of education: Not on file   Highest education level: Not on file  Occupational History   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  Tobacco Use   Smoking status: Former   Smokeless tobacco: Never  Substance and Sexual Activity   Alcohol use: Not Currently    Alcohol/week: 1.0 standard drink    Types: 1 Glasses of wine per week    Comment: occ   Drug use: No   Sexual  activity: Not on file  Other Topics Concern   Not on file  Social History Narrative   Has living will   Husband is [place] health care POA---alternate is daughters Lynne/Amanda   Would accept resuscitation but no prolonged ventilation   Probably would not want tube feedings if cognitively unaware   Social Determinants of Health   Financial Resource Strain: Not on file  Food Insecurity: Not on file  Transportation Needs: Not on file  Physical Activity: Not on file  Stress: Not on file  Social Connections: Not on file     Family History: The patient's family history includes Heart attack in her father; Melanoma in her daughter; Stroke in her mother.  ROS:   Please see the history of present illness.    All other systems reviewed and are negative.  EKGs/Labs/Other Studies Reviewed:    The following studies were reviewed today:  Nondistended 2022 ZIO personally reviewed Involving SVT episodes, longest lasting 4 minutes at an average rate of 119 bpm On SVT episodes were detected within 45 seconds and patient events Occasional ventricular ectopy, 1.2%  May 09, 2021 SPECT Normal pharmacologic myocardial perfusion stress test without significant ischemia or scar. The left ventricular ejection fraction is normal (60%). Frequent PVC's and brief run of atrial tachycardia noted during stress. No significant coronary artery calcification is present. Aortic atherosclerosis is noted on the attenuation correction CT. This is a low risk study.  EKG:  The ekg ordered today demonstrates sinus rhythm.  Intervals are normal.  Recent Labs: 04/14/2021: ALT 11 05/17/2021: BUN 27; Creatinine, Ser 0.73; Hemoglobin 12.6; Platelets 154; Potassium 3.6; Sodium 140  Recent Lipid Panel    Component Value Date/Time   CHOL 152 02/22/2014 1612   TRIG 98.0 02/22/2014 1612   HDL 63.00 02/22/2014 1612   CHOLHDL 2 02/22/2014 1612   VLDL 19.6 02/22/2014 1612   LDLCALC 69 02/22/2014 1612    Physical Exam:     VS:  BP 120/80 (BP Location: Left Arm, Patient Position: Sitting, Cuff Size: Normal)   Pulse 70   Ht '5\' 5"'$  (1.651 m)   Wt 155 lb (70.3 kg)   SpO2 91%   BMI 25.79 kg/m     Wt Readings from Last 3 Encounters:  06/21/21 155 lb (70.3 kg)  05/18/21 156 lb (70.8 kg)  05/17/21 125 lb (56.7 kg)     GEN:  Well nourished, well developed in no acute distress HEENT: Normal NECK: No JVD; No carotid bruits LYMPHATICS: No lymphadenopathy CARDIAC: RRR, no murmurs, rubs, gallops RESPIRATORY:  Clear to auscultation without rales, wheezing or rhonchi  ABDOMEN: Soft, non-tender, non-distended MUSCULOSKELETAL:  No edema; No deformity  SKIN: Warm and dry NEUROLOGIC:  Alert and oriented x 3 PSYCHIATRIC:  Normal affect   ASSESSMENT:    1. Palpitations   2. PSVT (paroxysmal supraventricular tachycardia) (  [place])   3. Tachycardia-bradycardia syndrome ([place])    PLAN:    In order of problems listed above:  1. Palpitations 2. PSVT (paroxysmal supraventricular tachycardia) ([place]) Patient previously had symptomatic palpitations associated with SVT noted on her ZIO monitor.  Symptomatically improved on metoprolol tartrate 25 mg by mouth twice daily.  She is tolerating this dose of metoprolol without significant bradycardia.  For now, would continue with the current medical regimen.  I do not see any clear indication for permanent pacing at this time.  There is no AV block on the ZIO monitor and she is tolerating the dose of metoprolol without symptomatic sinus bradycardia.  She should continue to follow with Jorja Loa in clinic.  Referred episodes of SVT worsen necessitating higher dose metoprolol that yields symptomatic bradycardia, could reconsider permanent pacing.  3. Tachycardia-bradycardia syndrome ([place]) See above.    Follow-up as needed.        Total time spent with patient today 47 minutes. This includes reviewing records, evaluating the patient and coordinating care.  Medication  Adjustments/Labs and Tests Ordered: Current medicines are reviewed at length with the patient today.  Concerns regarding medicines are outlined above.  Orders Placed This Encounter  Procedures   EKG 12-Lead   No orders of the defined types were placed in this encounter.    Signed, Hilton Cork. Quentin Ore, MD, [facility], [facility] 06/21/2021 9:16 PM    Electrophysiology [facility]

## 2021-07-06 ENCOUNTER — Ambulatory Visit: Payer: Medicare Other | Admitting: Nurse Practitioner

## 2021-07-18 ENCOUNTER — Encounter: Payer: Self-pay | Admitting: Cardiovascular Disease

## 2021-07-18 ENCOUNTER — Ambulatory Visit (INDEPENDENT_AMBULATORY_CARE_PROVIDER_SITE_OTHER): Payer: Medicare Other | Admitting: Cardiovascular Disease

## 2021-07-18 ENCOUNTER — Telehealth: Payer: Self-pay

## 2021-07-18 ENCOUNTER — Other Ambulatory Visit: Payer: Self-pay

## 2021-07-18 VITALS — BP 160/72 | HR 68 | Ht 65.0 in | Wt 155.4 lb

## 2021-07-18 DIAGNOSIS — I471 Supraventricular tachycardia: Secondary | ICD-10-CM

## 2021-07-18 DIAGNOSIS — R0602 Shortness of breath: Secondary | ICD-10-CM | POA: Diagnosis not present

## 2021-07-18 DIAGNOSIS — I10 Essential (primary) hypertension: Secondary | ICD-10-CM | POA: Diagnosis not present

## 2021-07-18 MED ORDER — METOPROLOL TARTRATE 25 MG PO TABS: 25.0000 mg | ORAL_TABLET | Freq: Two times a day (BID) | ORAL | 3 refills | Status: DC

## 2021-07-18 MED ORDER — METOPROLOL TARTRATE 25 MG PO TABS
25.0000 mg | ORAL_TABLET | Freq: Two times a day (BID) | ORAL | 3 refills | Status: DC
Start: 2021-07-18 — End: 2021-07-18

## 2021-07-18 NOTE — Patient Instructions (Addendum)
Please monitor blood pressure  Call if numbers >140  Medication Instructions:  No changes  If you need a refill on your cardiac medications before your next appointment, please call your pharmacy.   Lab work: No new labs needed  Testing/Procedures: No new testing needed  Follow-Up: At [facility], you and your health needs are our priority.  As part of our continuing mission to provide you with exceptional heart care, we have created designated Provider Care Teams.  These Care Teams include your primary Cardiologist (physician) and Advanced Practice Providers (APPs -  Physician Assistants and Nurse Practitioners) who all work together to provide you with the care you need, when you need it.  You will need a follow up appointment in 12 months  Providers on your designated Care Team:   Murray Hodgkins, NP Christell Faith, PA-C Marrianne Mood, PA-C Cadence Pembine, [place]  COVID-19 Vaccine Information can be found at: ShippingScam.co.uk For questions related to vaccine distribution or appointments, please email [email or web address] or call [phone].

## 2021-07-18 NOTE — Chronic Care Management (AMB) (Addendum)
Chronic Care Management Pharmacy Assistant   Name: Dominique Hall  MRN: [id number] DOB: 03/21/1935   Reason for Encounter: Initial Questions   Conditions to be addressed/monitored: HTN  Recent office visits:  05/11/21- Family Medicine- Patient presented for vaginal pain-Renewed her vaginal estrace,apply twice weekly,Don't start the metoprolol yet , follow up with cardiology/BP low 05/05/21-Family Medicine-Patient presented with burning urination.UA ordered-Started Macrobid '100mg'$  take 2 times daily  04/13/21-PCP-Patient presented for shortness of breath,labs ordered(UA Abnormal) 02/22/21- Family Medicine-Patient presented for follow up lightheaded. Stop taking hydrochlorothiazide 12.'5mg'$ , labs ordered,(looked good) increase water intake.  Recent consult visits:  06/21/21- Cardiology-Patient presented for palpitations. Patient may take additional 1/2 tablet of Metoprolol '25mg'$  for breakthrough fast heart rate. 05/18/21- Cardiology-Patient presented for follow up of syncope.Continue to wear Zio Monitor  04/19/21- Cardiology- Patient presented for follow up HTN.Start Isosorbide  '30mg'$  take 1/2 tablet once daily, stress test ordered  Hospital visits:  7/[facility] ED-Patient presented for flank pain, labs ordered( glucose , BUN elevate)UA(cloudy)  xrays- No admission  Medications: Outpatient Encounter Medications as of 07/18/2021  Medication Sig   celecoxib (CELEBREX) 200 MG capsule Take 1 capsule (200 mg total) by mouth daily.   Cholecalciferol (VITAMIN D-3) 25 MCG (1000 UT) CAPS Take 1 capsule by mouth daily.   estradiol (ESTRACE VAGINAL) 0.1 MG/GM vaginal cream Apply a pea sized amount to vagina twice weekly   metoprolol tartrate (LOPRESSOR) 25 MG tablet Take 1 tablet (25 mg total) by mouth 2 (two) times daily.   NON FORMULARY Immunity support gummies Takes 1 gummy daily.   NON FORMULARY Black elderberry gummy Takes 1 gummy qd.   Omega-3 Fatty Acids (FISH OIL PO) Take 1 capsule by mouth  daily.   omeprazole (PRILOSEC) 20 MG capsule TAKE 1 CAPSULE(20 MG) BY MOUTH DAILY   vitamin C (ASCORBIC ACID) 500 MG tablet Take 500 mg by mouth daily.   No facility-administered encounter medications on file as of 07/18/2021.   No results found for: HGBA1C, MICROALBUR   BP Readings from Last 3 Encounters:  06/21/21 120/80  05/18/21 (!) 157/76  05/17/21 (!) 163/69    Patient contacted to review initial questions prior to visit with Debbora Dus.  Have you seen any other providers since your last visit with PCP? Yes 06/21/21- Cardiology-Patient presented for palpitations. Patient may take additional 1/2 tablet of Metoprolol '25mg'$  for breakthrough fast heart rate. 05/18/21- Cardiology-Patient presented for follow up of syncope.Continue to wear Zio Monitor   Any changes in your medications or health? No  Any side effects from any medications? No  Do you have an symptoms or problems not managed by your medications? No  Any concerns about your health right now? No  Has your provider asked that you check blood pressure, or follow special diet at home? No, the patient reports she will take her BP when she feels bad  Do you get any type of exercise on a regular basis? Nothing formal since pandemic, however she goes grocery shopping and walks around her yard  Can you think of a goal you would like to reach for your health? No  Do you have any problems getting your medications? No The patient reports her insurance policy dictates her go to the [facility] [address] [place].  Is there anything that you would like to discuss during the appointment? No   Dominique Hall was reminded to have all medications, supplements and any blood glucose and blood pressure readings available for review with Debbora Dus, Pharm.  D, at her telephone visit on 07/25/21 at 10:00am.    Star Rating Drugs:  Medication:  Last Fill: Day Supply None identified  Care Gaps: Last annual wellness visit:  05/09/2020  Debbora Dus, CPP notified  Avel Sensor, [place] Assistant [phone]  I have reviewed the care management and care coordination activities outlined in this encounter and I am certifying that I agree with the content of this note. No further action required.  Debbora Dus, PharmD Clinical Pharmacist [facility] [phone]

## 2021-07-18 NOTE — Progress Notes (Signed)
Date:  07/18/2021   ID:  [name], DOB 21-May-1935, MRN [id number]  Patient Location:  [address] [place] [address]   Provider location:   [facility], [place] office  PCP:  Venia Carbon, MD  Cardiologist:  Arvid Right [facility]  Chief Complaint  Patient presents with   2-4 week follow up     "Doing well."  Medications reviewed by the patient verbally.     History of Present Illness:    Dominique Hall is a 86 y.o. female  past medical history of  chest pain.  palpitations  not diabetic and not a smoker. tachycardia in 2005 ,hospitalized at [facility] with negative work-up.   Who presents for routine follow-up of her chest pain episodes  Last seen in clinic Jan 2022 Prior orthostasis symptoms better after HCTZ held On last clinic visit, food sticking, upper epigastric Better with clorazepate  She put herself back on HCTZ  Some SOB Has to stop, relax, then goes again  Denies any chest pain concerning for angina Thinks that she is taking metoprolol tartrate 25 twice daily Blood pressure elevated today, anxious, long walk in Uses a wrist cuff at home reports it is typically [id number] systolic Husband confirms this   EKG personally reviewed by myself on todays visit Shows normal sinus rhythm with rate 68 bpm no significant ST-T wave changes  Other past medical history reviewed Prior echocardiogram and stress test No ischemia, normal ejection fraction  Stress test 07/2019 Pharmacological myocardial perfusion imaging study with no significant  ischemia Grossly Normal wall motion, Unable to estimate EF secondary to GI uptake artifact No EKG changes concerning for ischemia at peak stress or in recovery. Low risk scan  Echo  07/2019  1. Left ventricular ejection fraction, by visual estimation, is 60 to 65%. The left ventricle has normal function. Normal left ventricular size. There is no left ventricular hypertrophy.  2. Left ventricular  diastolic Doppler parameters are consistent with impaired relaxation pattern of LV diastolic filling.  3. Global right ventricle has normal systolic function.The right ventricular size is normal. No increase in right ventricular wall thickness.  4. Mild to moderately elevated pulmonary artery systolic pressure 42 mm Hg.  5. Left atrial size was mildly dilated.  6. Right atrial size was normal.    Past Medical History:  Diagnosis Date   Allergy    Arthritis    Chest pain    a. 07/2019 MV: No ischemia/infarct. Low risk; b. 04/2021 MV: No isch/scar. EF 60%. No significant cor Ca2+. Developed atrial tachycardia during study.   Diverticulosis    Esophageal dysmotility    Esophageal ring    GERD (gastroesophageal reflux disease)    Hiatal hernia    HTN (hypertension)    IBS (irritable bowel syndrome)    Osteopenia    PAH (pulmonary artery hypertension) ([place])    a. 07/2019 Echo: EF 60-65%, impaired relaxation. Nl RV size/fxn. PASP 54mHg. Mildly dil LA.   Palpitations    Pre-syncope    Renal disorder    Sleep disturbance    Past Surgical History:  Procedure Laterality Date   ABDOMINAL HYSTERECTOMY     APPENDECTOMY  1973   BREAST EXCISIONAL BIOPSY Right    CHOLECYSTECTOMY     ERCP N/A 11/22/2017   Procedure: ENDOSCOPIC RETROGRADE CHOLANGIOPANCREATOGRAPHY (ERCP);  Surgeon: WLucilla Lame MD;  [facility]  Service: Endoscopy;  Laterality: N/A;   ESOPHAGOGASTRODUODENOSCOPY  07-2006   with  dilation   KIDNEY SURGERY  1970   lumpectomy  [phone]   benign right breast   SQUAMOUS CELL CARCINOMA EXCISION  6/12   Right leg--Dr Cresenciano Lick CELL CARCINOMA EXCISION  2012   right calf   TOTAL ABDOMINAL HYSTERECTOMY W/ BILATERAL SALPINGOOPHORECTOMY  1973     Allergies:   Penicillins, Rabeprazole sodium, Sulfonamide derivatives, and Cefuroxime   Social History   Tobacco Use   Smoking status: Former   Smokeless tobacco: Never  Substance Use Topics   Alcohol use: Not Currently     Alcohol/week: 1.0 standard drink    Types: 1 Glasses of wine per week    Comment: occ   Drug use: No     Current Outpatient Medications on File Prior to Visit  Medication Sig Dispense Refill   celecoxib (CELEBREX) 200 MG capsule Take 1 capsule (200 mg total) by mouth daily. 90 capsule 3   Cholecalciferol (VITAMIN D-3) 25 MCG (1000 UT) CAPS Take 1 capsule by mouth daily.     estradiol (ESTRACE VAGINAL) 0.1 MG/GM vaginal cream Apply a pea sized amount to vagina twice weekly 42.5 g 0   metoprolol tartrate (LOPRESSOR) 12.5 mg TABS tablet Take 12.5 mg by mouth 2 (two) times daily.     NON FORMULARY Immunity support gummies Takes 1 gummy daily.     NON FORMULARY Black elderberry gummy Takes 1 gummy qd.     Omega-3 Fatty Acids (FISH OIL PO) Take 1 capsule by mouth daily.     omeprazole (PRILOSEC) 20 MG capsule TAKE 1 CAPSULE(20 MG) BY MOUTH DAILY 90 capsule 3   vitamin C (ASCORBIC ACID) 500 MG tablet Take 500 mg by mouth daily.     No current facility-administered medications on file prior to visit.     Family Hx: The patient's family history includes Heart attack in her father; Melanoma in her daughter; Stroke in her mother.  ROS:   Please see the history of present illness.    Review of Systems  Constitutional: Negative.   HENT: Negative.    Respiratory: Negative.    Cardiovascular: Negative.   Gastrointestinal: Negative.   Musculoskeletal: Negative.   Neurological: Negative.   Psychiatric/Behavioral: Negative.    All other systems reviewed and are negative.   Labs/Other Tests and Data Reviewed:    Recent Labs: 04/14/2021: ALT 11 05/17/2021: BUN 27; Creatinine, Ser 0.73; Hemoglobin 12.6; Platelets 154; Potassium 3.6; Sodium 140   Recent Lipid Panel Lab Results  Component Value Date/Time   CHOL 152 02/22/2014 04:12 PM   TRIG 98.0 02/22/2014 04:12 PM   HDL 63.00 02/22/2014 04:12 PM   CHOLHDL 2 02/22/2014 04:12 PM   LDLCALC 69 02/22/2014 04:12 PM    Wt Readings from Last  3 Encounters:  07/18/21 155 lb 6 oz (70.5 kg)  06/21/21 155 lb (70.3 kg)  05/18/21 156 lb (70.8 kg)    Exam:    Vital Signs: Vital signs may also be detailed in the HPI BP (!) 180/82 (BP Location: Left Arm, Patient Position: Sitting, Cuff Size: Normal)   Pulse 68   Ht '5\' 5"'$  (1.651 m)   Wt 155 lb 6 oz (70.5 kg)   SpO2 98%   BMI 25.86 kg/m    Constitutional:  oriented to person, place, and time. No distress.  HENT:  Head: Grossly normal Eyes:  no discharge. No scleral icterus.  Neck: No JVD, no carotid bruits  Cardiovascular: Regular rate and rhythm, no murmurs appreciated Pulmonary/Chest: Clear to auscultation bilaterally,  no wheezes or rails Abdominal: Soft.  no distension.  no tenderness.  Musculoskeletal: Normal range of motion Neurological:  normal muscle tone. Coordination normal. No atrophy Skin: Skin warm and dry Psychiatric: normal affect, pleasant  ASSESSMENT & PLAN:    Problem List Items Addressed This Visit       Cardiology Problems   Essential hypertension, benign   Relevant Medications   metoprolol tartrate (LOPRESSOR) 12.5 mg TABS tablet   Other Relevant Orders   EKG 12-Lead   Other Visit Diagnoses     PSVT (paroxysmal supraventricular tachycardia) (HCC)    -  Primary   Relevant Medications   metoprolol tartrate (LOPRESSOR) 12.5 mg TABS tablet   Other Relevant Orders   EKG 12-Lead   SOB (shortness of breath)       Relevant Orders   EKG 12-Lead      Chest pain: Denies any chest pain concerning for angina, no further work-up  Shortness of breath Reports breathing is stable, Recommended regular walking program, stable from previous visits  Dizziness HCTZ previously held for orthostasis, she has put herself back on the medication, on last clinic visit, Now reports she is off medication again, happy to stay on metoprolol  Essential hypertension HCTZ previously held for orthostasis symptoms Blood pressure elevated today, suspect whitecoat  syndrome Will continue metoprolol tartrate 25 twice daily, monitor blood pressure at home She will call us with numbers  Paroxysmal tachycardia Recommend she stay on metoprolol tartrate 25 twice daily   Total encounter time more than 25 minutes  Greater than 50% was spent in counseling and coordination of care with the patient   Signed, Ida Rogue, MD  [place] [facility] [id number], [place], [place] [address]

## 2021-07-25 ENCOUNTER — Ambulatory Visit (INDEPENDENT_AMBULATORY_CARE_PROVIDER_SITE_OTHER): Payer: Medicare Other

## 2021-07-25 ENCOUNTER — Other Ambulatory Visit: Payer: Self-pay

## 2021-07-25 VITALS — BP 138/77 | HR 82

## 2021-07-25 DIAGNOSIS — K219 Gastro-esophageal reflux disease without esophagitis: Secondary | ICD-10-CM

## 2021-07-25 DIAGNOSIS — I10 Essential (primary) hypertension: Secondary | ICD-10-CM

## 2021-07-25 DIAGNOSIS — M159 Polyosteoarthritis, unspecified: Secondary | ICD-10-CM

## 2021-07-25 NOTE — Progress Notes (Signed)
Chronic Care Management Pharmacy Note  07/25/2021 Name:  Dominique Hall MRN:  [id number] DOB:  [birth year]  Summary: Initial CCM medication review completed. Discussed HTN, she is monitoring BP at home and within goal, not too low. She does continue to have some dizziness after breakfast but reports this started prior to metoprolol initiation. No falls. No further concerns identified.   Recommendations/Changes made from today's visit: Increase water intake, especially before breakfast meal to prevent post-prandial hypotension  Plan: CCM follow up 12 months or sooner if needed, phone # provided   Subjective: Dominique Hall is an 86 y.o. year old female who is a primary patient of Venia Carbon, MD.  The CCM team was consulted for assistance with disease management and care coordination needs.    Engaged with patient by telephone for initial visit in response to provider referral for pharmacy case management and/or care coordination services.   Consent to Services:  The patient was given the following information about Chronic Care Management services today, agreed to services, and gave verbal consent: 1. CCM service includes personalized support from designated clinical staff supervised by the primary care provider, including individualized plan of care and coordination with other care providers 2. 24/7 contact phone numbers for assistance for urgent and routine care needs. 3. Service will only be billed when office clinical staff spend 20 minutes or more in a month to coordinate care. 4. Only one practitioner may furnish and bill the service in a calendar month. 5.The patient may stop CCM services at any time (effective at the end of the month) by phone call to the office staff. 6. The patient will be responsible for cost sharing (co-pay) of up to 20% of the service fee (after annual deductible is met). Patient agreed to services and consent obtained.  Patient Care  Team: Venia Carbon, MD as PCP - General Rockey Situ Kathlene November, MD as PCP - Cardiology (Cardiology) Vickie Epley, MD as PCP - Electrophysiology (Cardiology) Debbora Dus, [facility] as Pharmacist (Pharmacist)  Recent office visits:  05/11/21- Family Medicine- Patient presented for vaginal pain. Renewed her vaginal estrace, apply twice weekly. Don't start the metoprolol yet. Follow up with cardiology/BP low. 05/05/21-Family Medicine-Patient presented with burning urination.UA ordered.Started Macrobid 190m take 2 times daily  04/13/21-PCP-Patient presented for shortness of breath,labs ordered(UA Abnormal) 02/22/21- Family Medicine-Patient presented for follow up lightheaded. Stop taking hydrochlorothiazide 12.572m labs ordered,(looked good) increase water intake.   Recent consult visits:  07/18/21 - Cardiology - Pt presented for 2-4 week follow up PSVT, HTN. Blood pressure elevated today, suspect whitecoat syndrome. Will continue metoprolol tartrate 25 twice daily, monitor blood pressure at home. [place]. 06/21/21- Cardiology-Patient presented for palpitations. Patient may take additional 1/2 tablet of Metoprolol 2567mor breakthrough fast heart rate. 05/18/21- Cardiology-Patient presented for follow up of syncope.Continue to wear Zio Monitor  04/19/21- Cardiology- Patient presented for follow up HTN.Start Isosorbide  10m84mke 1/2 tablet once daily, stress test ordered   [facility] ordered( glucose , BUN elevate)UA(cloudy)  xrays- No admission  Objective:  Lab Results  Component Value Date   CREATININE 0.73 05/17/2021   BUN 27 (H) 05/17/2021   GFR 66.82 04/14/2021   GFRNONAA >60 05/17/2021   GFRAA >60 06/23/2018   NA 140 05/17/2021   K 3.6 05/17/2021   CALCIUM 9.3 05/17/2021   CO2 26 05/17/2021   GLUCOSE 110 (H) 05/17/2021    Lab  Results  Component Value Date/Time   GFR 66.82 04/14/2021 11:11 AM   GFR  65.91 02/22/2021 12:55 PM     Lab Results  Component Value Date   CHOL 152 02/22/2014   HDL 63.00 02/22/2014   LDLCALC 69 02/22/2014   TRIG 98.0 02/22/2014   CHOLHDL 2 02/22/2014    Hepatic Function Latest Ref Rng & Units 04/14/2021 05/06/2019 06/23/2018  Total Protein 6.0 - 8.3 g/dL 6.1 6.2 6.6  Albumin 3.5 - 5.2 g/dL 4.2 4.3 3.9  AST 0 - 37 U/L '14 14 21  ' ALT 0 - 35 U/L '11 13 15  ' Alk Phosphatase 39 - 117 U/L 65 69 59  Total Bilirubin 0.2 - 1.2 mg/dL 0.7 0.9 0.9  Bilirubin, Direct 0.00 - 0.40 mg/dL - - -    Lab Results  Component Value Date/Time   TSH 1.15 01/14/2020 12:20 PM   TSH 0.35 02/22/2014 04:12 PM   FREET4 0.94 04/14/2021 11:11 AM   FREET4 0.81 03/19/2016 12:54 PM    CBC Latest Ref Rng & Units 05/17/2021 04/14/2021 02/22/2021  WBC 4.0 - 10.5 K/uL 13.5(H) 4.5 6.2  Hemoglobin 12.0 - 15.0 g/dL 12.6 12.5 12.7  Hematocrit 36.0 - 46.0 % 36.3 36.3 36.8  Platelets 150 - 400 K/uL 154 143.0(L) 161.0    No results found for: VD25OH  Clinical ASCVD: No  The ASCVD Risk score (Arnett DK, et al., 2019) failed to calculate for the following reasons:   The 2019 ASCVD risk score is only valid for ages 84 to 11    Depression screen PHQ 2/9 05/09/2020 05/09/2020 05/06/2019  Decreased Interest 0 0 0  Down, Depressed, Hopeless 0 0 0  PHQ - 2 Score 0 0 0    Social History   Tobacco Use  Smoking Status Former  Smokeless Tobacco Never   BP Readings from Last 3 Encounters:  07/25/21 138/77  07/18/21 (!) 160/72  06/21/21 120/80   Pulse Readings from Last 3 Encounters:  07/25/21 82  07/18/21 68  06/21/21 70   Wt Readings from Last 3 Encounters:  07/18/21 155 lb 6 oz (70.5 kg)  06/21/21 155 lb (70.3 kg)  05/18/21 156 lb (70.8 kg)   BMI Readings from Last 3 Encounters:  07/18/21 25.86 kg/m  06/21/21 25.79 kg/m  05/18/21 25.96 kg/m    Assessment/Interventions: Review of patient past medical history, allergies, medications, health status, including review of consultants  reports, laboratory and other test data, was performed as part of comprehensive evaluation and provision of chronic care management services.   SDOH:  (Social Determinants of Health) assessments and interventions performed: Yes SDOH Interventions    Flowsheet Row Most Recent Value  SDOH Interventions   Financial Strain Interventions Intervention Not Indicated      SDOH Screenings   Alcohol Screen: Not on file  Depression (PHQ2-9): Not on file  Financial Resource Strain: Low Risk    Difficulty of Paying Living Expenses: Not very hard  Food Insecurity: Not on file  Housing: Not on file  Physical Activity: Not on file  Social Connections: Not on file  Stress: Not on file  Tobacco Use: Medium Risk   Smoking Tobacco Use: Former   Smokeless Tobacco Use: Never  Transportation Needs: Not on file    [place]  Allergies  Allergen Reactions   Penicillins     Has patient had a PCN reaction causing immediate rash, facial/tongue/throat swelling, SOB or lightheadedness with hypotension: Unknown Has patient had a PCN reaction causing severe rash  involving mucus membranes or skin necrosis: Unknown Has patient had a PCN reaction that required hospitalization: Unknown Has patient had a PCN reaction occurring within the last 10 years: Unknown If all of the above answers are "NO", then may proceed with Cephalosporin use.   Rabeprazole Sodium     REACTION: constipated   Sulfonamide Derivatives     REACTION: unspecified   Cefuroxime     diarrhea    Medications Reviewed Today     Reviewed by Debbora Dus, [facility] (Pharmacist) on 07/25/21 at 1044  Med List Status: <None>   Medication Order Taking? Sig Documenting Provider Last Dose Status Informant  celecoxib (CELEBREX) 200 MG capsule [id number] Yes Take 1 capsule (200 mg total) by mouth daily. Venia Carbon, MD Taking Active   Cholecalciferol (VITAMIN D-3) 25 MCG (1000 UT) CAPS [id number] Yes Take 1 capsule by mouth daily. [provider] Taking Active   estradiol (ESTRACE VAGINAL) 0.1 MG/GM vaginal cream [id number] Yes Apply a pea sized amount to vagina twice weekly Tower, Wynelle Fanny, MD Taking Active   metoprolol tartrate (LOPRESSOR) 25 MG tablet [id number] Yes Take 1 tablet (25 mg total) by mouth 2 (two) times daily. Minna Merritts, MD Taking Active   NON FORMULARY [id number] Yes Immunity support gummies Takes 1 gummy daily. [provider] Taking Active   NON FORMULARY [id number] Yes Black elderberry gummy Takes 1 gummy qd. [provider] Taking Active   Omega-3 Fatty Acids (FISH OIL PO) [id number] Yes Take 1 capsule by mouth daily. [provider] Taking Active Self  omeprazole (PRILOSEC) 20 MG capsule [id number] Yes TAKE 1 CAPSULE(20 MG) BY MOUTH DAILY Venia Carbon, MD Taking Active   vitamin C (ASCORBIC ACID) 500 MG tablet [id number] Yes Take 500 mg by mouth daily. [provider] Taking Active Self            Patient Active Problem List   Diagnosis Date Noted   Vaginal pain 05/11/2021   Acute cystitis 05/05/2021   DOE (dyspnea on exertion) 04/13/2021   Cognitive changes 04/13/2021   Lightheadedness 02/22/2021   Generalized osteoarthritis 05/06/2019   Dermatitis 05/22/2018   Left knee pain 12/14/2016   Solitary pulmonary nodule 12/22/2015   Cough 11/09/2015   Advance directive discussed with patient 03/17/2015   Atrophic vaginitis 01/29/2014   Cystocele, grade 1-2 01/29/2014   Fibrocystic breast changes 12/05/2012   Routine general medical examination at a health care facility 08/14/2011   Mild mood disorder ([place]) 04/15/2007   Essential hypertension, benign 04/15/2007   ALLERGIC RHINITIS 04/15/2007   GERD 04/15/2007   DIVERTICULOSIS, COLON 04/15/2007   OSTEOPENIA 04/15/2007    Immunization History  Administered Date(s) Administered   Fluad Quad(high Dose 65+) 08/18/2019, 11/21/2020   Influenza Split 08/14/2011, 11/25/2012   Influenza Whole 08/29/2007,  08/16/2008, 08/03/2009, 08/08/2010   Influenza,inj,Quad PF,6+ Mos 09/21/2014, 12/22/2015, 09/07/2016, 08/15/2017, 09/18/2018   PFIZER(Purple Top)SARS-COV-2 Vaccination 11/19/2019, 12/10/2019, 07/29/2020   Pneumococcal Conjugate-13 03/19/2016   Pneumococcal Polysaccharide-23 09/09/2002   Td 03/29/2003   Zoster Recombinat (Shingrix) 12/23/2018   Zoster, Live 01/15/2013    Conditions to be addressed/monitored:  Hypertension, GERD, and Osteoarthritis  Care Plan : [place]  Updates made by Debbora Dus, [place] since 07/25/2021 12:00 AM     Problem: CHL AMB "PATIENT-SPECIFIC PROBLEM"      Long-Range Goal: Disease Management   Start Date: 07/25/2021  Priority: High  Note:   Current Barriers:  None identified  Pharmacist Clinical Goal(s):  Patient will contact  provider office for questions/concerns as evidenced notation of same in electronic health record through collaboration with PharmD and provider.   Interventions: 1:1 collaboration with Venia Carbon, MD regarding development and update of comprehensive plan of care as evidenced by provider attestation and co-signature Inter-disciplinary care team collaboration (see longitudinal plan of care) Comprehensive medication review performed; medication list updated in electronic medical record  Hypertension (BP goal <140/90) -Controlled - per home BP reading today -Current treatment: Metoprolol 25 mg - 1 tablet BID  -Medications previously tried:  HCTZ (orthostasis) -Current home readings: 138/77, 82 (at home during visit, wrist cuff) -Current dietary habits: she states she could drink more water, although she has been more attentive to it recently -Current exercise habits: tries to stay active, grocery store shopping, walking around her yard -Reports hypotensive symptoms. Denies hypertensive symptoms. She feels like she may pass out, dizzy, every morning, about 30 minutes after breakfast. Last about a half hour. None  worse since starting metoprolol. No falls. We discussed ways to mitigate postprandial orthostasis including drinking water prior to meal, eat small meal low in carbs, remain seated or lie down after meal. -Educated on BP goals and benefits of medications for prevention of heart attack, stroke and kidney damage; -Counseled to monitor BP at home weekly, document, and provide log at future appointments -Recommended to continue current medication; Discussed increasing water intake, esp. drinking a full glass before meals. Eat smaller meals, low in carb to prevent postprandial orthostasis.  GERD (Goal: Symptom control) -Controlled - per patient report -On chronic NSAID - celecoxib  -Current treatment  Omeprazole 20 mg - 1 capsule daily (dose reduced from 40 mg 04/2020) -Medications previously tried: none reported  -Recommended to continue current medication  Osteoarthritis (Goal: Symptom control) -Controlled - per patient report -Reports taking for relief of pain on cyst on knee, works very well. Takes 1 every day. She notices worse pain when she misses a dose. Long term NSAID use. -Current treatment  Celecoxib 200 mg - 1 daily -Medications previously tried: none -Avoids other NSAIDs. -Denies any abnormal bruising or bleeding. -No CKD or cardiac history. She does have HTN.  -Recommended to continue current medication  Patient Goals/Self-Care Activities Patient will:  - take medications as prescribed  Follow Up Plan: Telephone follow up appointment with care management team member scheduled for: 12 months     Medication Assistance: None required.  Patient affirms current coverage meets needs.  Compliance/Adherence/Medication fill history: Care Gaps: None  Star-Rating Drugs: None identified  Patient's preferred pharmacy is:  [facility] [place], [place] [address] [place]  [address] Phone: [phone] Fax: [phone]  Pt endorses 100% compliance - Insurance required she goes to [employer] starting this year.  We discussed: Current pharmacy is preferred with insurance plan and patient is satisfied with pharmacy services Patient decided to: Continue current medication management strategy  Care Plan and Follow Up Patient Decision:  Patient agrees to Care Plan and Follow-up.  Debbora Dus, PharmD Clinical Pharmacist [facility] [phone]

## 2021-07-25 NOTE — Patient Instructions (Signed)
July 25, 2021  Dear Dominique Hall,  It was a pleasure meeting you during our initial appointment on July 25, 2021. Below is a summary of the goals we discussed and components of chronic care management. Please contact me anytime with questions or concerns.   Visit Information  Patient Care Plan: CCM Pharmacy Care Plan     Problem Identified: CHL AMB "PATIENT-SPECIFIC PROBLEM"      Long-Range Goal: Disease Management   Start Date: 07/25/2021  Priority: High  Note:   Current Barriers:  None identified  Pharmacist Clinical Goal(s):  Patient will contact provider office for questions/concerns as evidenced notation of same in electronic health record through collaboration with PharmD and provider.   Interventions: 1:1 collaboration with Venia Carbon, MD regarding development and update of comprehensive plan of care as evidenced by provider attestation and co-signature Inter-disciplinary care team collaboration (see longitudinal plan of care) Comprehensive medication review performed; medication list updated in electronic medical record  Hypertension (BP goal <140/90) -Controlled - per home BP reading today -Current treatment: Metoprolol 25 mg - 1 tablet BID  -Medications previously tried:  HCTZ (orthostasis) -Current home readings: 138/77, 82 (at home during visit, wrist cuff) -Current dietary habits: she states she could drink more water, although she has been more attentive to it recently -Current exercise habits: tries to stay active, grocery store shopping, walking around her yard -Reports hypotensive symptoms. Denies hypertensive symptoms. She feels like she may pass out, dizzy, every morning, about 30 minutes after breakfast. Last about a half hour. None worse since starting metoprolol. No falls. We discussed ways to mitigate postprandial orthostasis including drinking water prior to meal, eat small meal low in carbs, remain seated or lie down after  meal. -Educated on BP goals and benefits of medications for prevention of heart attack, stroke and kidney damage; -Counseled to monitor BP at home weekly, document, and provide log at future appointments -Recommended to continue current medication; Discussed increasing water intake, esp. drinking a full glass before meals. Eat smaller meals, low in carb to prevent postprandial orthostasis.  GERD (Goal: Symptom control) -Controlled - per patient report -On chronic NSAID - celecoxib  -Current treatment  Omeprazole 20 mg - 1 capsule daily (dose reduced from 40 mg 04/2020) -Medications previously tried: none reported  -Recommended to continue current medication  Osteoarthritis (Goal: Symptom control) -Controlled - per patient report -Reports taking for relief of pain on cyst on knee, works very well. Takes 1 every day. She notices worse pain when she misses a dose. Long term NSAID use. -Current treatment  Celecoxib 200 mg - 1 daily -Medications previously tried: none -Avoids other NSAIDs. -Denies any abnormal bruising or bleeding. -No CKD or cardiac history. She does have HTN.  -Recommended to continue current medication  Patient Goals/Self-Care Activities Patient will:  - take medications as prescribed  Follow Up Plan: Telephone follow up appointment with care management team member scheduled for: 12 months     Dominique Hall was given information about Chronic Care Management services today including:  CCM service includes personalized support from designated clinical staff supervised by her physician, including individualized plan of care and coordination with other care providers 24/7 contact phone numbers for assistance for urgent and routine care needs. Standard insurance, coinsurance, copays and deductibles apply for chronic care management only during months in which we provide at least 20 minutes of these services. Most insurances cover these services at 100%, however patients may  be responsible for any copay, coinsurance  and/or deductible if applicable. This service may help you avoid the need for more expensive face-to-face services. Only one practitioner may furnish and bill the service in a calendar month. The patient may stop CCM services at any time (effective at the end of the month) by phone call to the office staff.  Patient agreed to services and verbal consent obtained.   The patient verbalized understanding of instructions, educational materials, and care plan provided today and declined offer to receive copy of patient instructions, educational materials, and care plan.   Debbora Dus, PharmD Clinical Pharmacist [facility] at [facility] [phone]

## 2021-07-27 ENCOUNTER — Other Ambulatory Visit: Payer: Self-pay

## 2021-07-27 ENCOUNTER — Ambulatory Visit (INDEPENDENT_AMBULATORY_CARE_PROVIDER_SITE_OTHER): Payer: Medicare Other | Admitting: Internal Medicine

## 2021-07-27 ENCOUNTER — Encounter: Payer: Self-pay | Admitting: Internal Medicine

## 2021-07-27 VITALS — BP 130/86 | HR 61 | Temp 97.6°F | Ht 64.0 in | Wt 155.0 lb

## 2021-07-27 DIAGNOSIS — Z0000 Encounter for general adult medical examination without abnormal findings: Secondary | ICD-10-CM | POA: Diagnosis not present

## 2021-07-27 DIAGNOSIS — M159 Polyosteoarthritis, unspecified: Secondary | ICD-10-CM

## 2021-07-27 DIAGNOSIS — F39 Unspecified mood [affective] disorder: Secondary | ICD-10-CM

## 2021-07-27 DIAGNOSIS — N952 Postmenopausal atrophic vaginitis: Secondary | ICD-10-CM | POA: Diagnosis not present

## 2021-07-27 DIAGNOSIS — I471 Supraventricular tachycardia: Secondary | ICD-10-CM | POA: Diagnosis not present

## 2021-07-27 DIAGNOSIS — I10 Essential (primary) hypertension: Secondary | ICD-10-CM

## 2021-07-27 NOTE — Progress Notes (Signed)
Vision Screening   Right eye Left eye Both eyes  Without correction     With correction '20/30 20/50 20/25 '$  Hearing Screening - Comments:: Has hearing aids. Wearing them today

## 2021-07-27 NOTE — Assessment & Plan Note (Signed)
BP Readings from Last 3 Encounters:  07/27/21 130/86  07/25/21 138/77  07/18/21 (!) 160/72   Okay on metoprolol

## 2021-07-27 NOTE — Progress Notes (Signed)
Subjective:    Patient ID: Dominique Hall, female    DOB: December 09, 1934, 86 y.o.   MRN: [id number]  HPI Here with husband for Medicare wellness visit and follow up of chronic health conditions This visit occurred during the SARS-CoV-2 public health emergency.  Safety protocols were in place, including screening questions prior to the visit, additional usage of staff PPE, and extensive cleaning of exam room while observing appropriate contact time as indicated for disinfecting solutions.   Reviewed advanced directives Reviewed other doctors---Dr Gollan--cardiology, Dr Hyatt--podiatry, Dr Burnard Leigh, [place] dentist No hospitalizations or surgery Vision is okay Has hearing aides--they help some No falls No depression or anhedonia Will have occasional wine No tobacco No regular exercise Independent with instrumental ADLs No sig memory problems  Doing well Finally not having the palpitations on metoprolol This has allowed her to get back to normal function No chest pain  Stable DOE----could be deconditioning No dizziness or syncope No edema  Taking celebrex daily Helps with joint pains  Taking prilosec daily No heartburn or dysphagia on this  Using the estrogen as needed Can get burning with urination at times---uses it briefly Discussed using it regularly (like weekly)  No mood issues now No longer using the tranxene  Current Outpatient Medications on File Prior to Visit  Medication Sig Dispense Refill   celecoxib (CELEBREX) 200 MG capsule Take 1 capsule (200 mg total) by mouth daily. 90 capsule 3   Cholecalciferol (VITAMIN D-3) 25 MCG (1000 UT) CAPS Take 1 capsule by mouth daily.     estradiol (ESTRACE VAGINAL) 0.1 MG/GM vaginal cream Apply a pea sized amount to vagina twice weekly 42.5 g 0   metoprolol tartrate (LOPRESSOR) 25 MG tablet Take 1 tablet (25 mg total) by mouth 2 (two) times daily. 180 tablet 3   NON FORMULARY Immunity support gummies Takes 1  gummy daily.     NON FORMULARY Black elderberry gummy Takes 1 gummy qd.     Omega-3 Fatty Acids (FISH OIL PO) Take 1 capsule by mouth daily.     omeprazole (PRILOSEC) 20 MG capsule TAKE 1 CAPSULE(20 MG) BY MOUTH DAILY 90 capsule 3   vitamin C (ASCORBIC ACID) 500 MG tablet Take 500 mg by mouth daily.     No current facility-administered medications on file prior to visit.    Allergies  Allergen Reactions   Penicillins     Has patient had a PCN reaction causing immediate rash, facial/tongue/throat swelling, SOB or lightheadedness with hypotension: Unknown Has patient had a PCN reaction causing severe rash involving mucus membranes or skin necrosis: Unknown Has patient had a PCN reaction that required hospitalization: Unknown Has patient had a PCN reaction occurring within the last 10 years: Unknown If all of the above answers are "NO", then may proceed with Cephalosporin use.   Rabeprazole Sodium     REACTION: constipated   Sulfonamide Derivatives     REACTION: unspecified   Cefuroxime     diarrhea    Past Medical History:  Diagnosis Date   Allergy    Arthritis    Chest pain    a. 07/2019 MV: No ischemia/infarct. Low risk; b. 04/2021 MV: No isch/scar. EF 60%. No significant cor Ca2+. Developed atrial tachycardia during study.   Diverticulosis    Esophageal dysmotility    Esophageal ring    GERD (gastroesophageal reflux disease)    Hiatal hernia    HTN (hypertension)    IBS (irritable bowel syndrome)    Osteopenia  PAH (pulmonary artery hypertension) ([place])    a. 07/2019 Echo: EF 60-65%, impaired relaxation. Nl RV size/fxn. PASP 57mHg. Mildly dil LA.   Palpitations    Pre-syncope    Renal disorder    Sleep disturbance     Past Surgical History:  Procedure Laterality Date   ABDOMINAL HYSTERECTOMY     APPENDECTOMY  1973   BREAST EXCISIONAL BIOPSY Right    CHOLECYSTECTOMY     ERCP N/A 11/22/2017   Procedure: ENDOSCOPIC RETROGRADE CHOLANGIOPANCREATOGRAPHY (ERCP);   Surgeon: WLucilla Lame MD;  [facility];  Service: Endoscopy;  Laterality: N/A;   ESOPHAGOGASTRODUODENOSCOPY  07-2006   with dilation   KIDNEY SURGERY  1970   lumpectomy  [phone]  benign right breast   SQUAMOUS CELL CARCINOMA EXCISION  6/12   Right leg--Dr ECresenciano LickCELL CARCINOMA EXCISION  2012   right calf   TOTAL ABDOMINAL HYSTERECTOMY W/ BILATERAL SALPINGOOPHORECTOMY  1973    Family History  Problem Relation Age of Onset   Heart attack Father    Stroke Mother    Melanoma Daughter     Social History   Socioeconomic History   Marital status: Married    Spouse name: Not on file   Number of children: 2   Years of education: Not on file   Highest education level: Not on file  Occupational History   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  Tobacco Use   Smoking status: Former   Smokeless tobacco: Never  Substance and Sexual Activity   Alcohol use: Not Currently    Alcohol/week: 1.0 standard drink    Types: 1 Glasses of wine per week    Comment: occ   Drug use: No   Sexual activity: Not on file  Other Topics Concern   Not on file  Social History Narrative   Has living will   Husband is [facility] [facility] POA---alternate is daughters Lynne/Amanda   Would accept resuscitation but no prolonged ventilation   Probably would not want tube feedings if cognitively unaware   Social Determinants of Health   Financial Resource Strain: Low Risk    Difficulty of Paying Living Expenses: Not very hard  Food Insecurity: Not on file  Transportation Needs: Not on file  Physical Activity: Not on file  Stress: Not on file  Social Connections: Not on file  Intimate Partner Violence: Not on file   Review of Systems Appetite is good Weight is stable Some trouble with sleep---uses some tylenol PM. Discussed trying melatonin  Wears seat belt Teeth okay---keeps up with dentist Husband wanted me to check spot on her back Bowels are fine--no blood No sig back or joint pains Has  left Baker's cyst---stable size. Occasional pain    Objective:   Physical Exam Constitutional:      Appearance: Normal appearance.  HENT:     Mouth/Throat:     Comments: No lesions Eyes:     Conjunctiva/sclera: Conjunctivae normal.     Pupils: Pupils are equal, round, and reactive to light.  Cardiovascular:     Rate and Rhythm: Normal rate and regular rhythm.     Pulses: Normal pulses.     Heart sounds: No murmur heard.   No gallop.  Pulmonary:     Effort: Pulmonary effort is normal.     Breath sounds: Normal breath sounds. No wheezing or rales.  Abdominal:     Palpations: Abdomen is soft.     Tenderness: There is no abdominal tenderness.  Musculoskeletal:  Cervical back: Neck supple.     Right lower leg: No edema.     Left lower leg: No edema.  Lymphadenopathy:     Cervical: No cervical adenopathy.  Skin:    General: Skin is warm.     Comments: Benign fleshy pink lesions on back (seb keratoses)  Neurological:     Mental Status: She is alert and oriented to person, place, and time.     Comments: President--"Biden, Trump, ?" [id number] Recall 3/3  Psychiatric:        Mood and Affect: Mood normal.        Behavior: Behavior normal.           Assessment & Plan:

## 2021-07-27 NOTE — Assessment & Plan Note (Signed)
Suggested she uses the estrogen cream weekly

## 2021-07-27 NOTE — Patient Instructions (Addendum)
Please stop the tylenol PM---try regular tylenol instead (and you can add melatonin '3mg'$  and double that if necessary)  Please try the estrogen cream once every week--instead of waiting for symptoms

## 2021-07-27 NOTE — Assessment & Plan Note (Signed)
No recent problems Off the tranxene now

## 2021-07-27 NOTE — Assessment & Plan Note (Signed)
Episodes have stopped since on metoprolol

## 2021-07-27 NOTE — Assessment & Plan Note (Signed)
I have personally reviewed the Medicare Annual Wellness questionnaire and have noted 1. The patient's medical and social history 2. Their use of alcohol, tobacco or illicit drugs 3. Their current medications and supplements 4. The patient's functional ability including ADL's, fall risks, home safety risks and hearing or visual             impairment. 5. Diet and physical activities 6. Evidence for depression or mood disorders  The patients weight, height, BMI and visual acuity have been recorded in the chart I have made referrals, counseling and provided education to the patient based review of the above and I have provided the pt with a written personalized care plan for preventive services.  I have provided you with a copy of your personalized plan for preventive services. Please take the time to review along with your updated medication list.  No cancer screening--discussed stopping mammogram She will get flu vaccine and bivalent COVID at pharmacy in early October Discussed exercise--especially resistance

## 2021-07-27 NOTE — Assessment & Plan Note (Signed)
Pain is controlled with celebrex daily Is on PPI and renal function normal--so okay for daily use

## 2021-08-06 ENCOUNTER — Other Ambulatory Visit: Payer: Self-pay | Admitting: Family Medicine

## 2021-08-07 ENCOUNTER — Telehealth (INDEPENDENT_AMBULATORY_CARE_PROVIDER_SITE_OTHER): Payer: Medicare Other | Admitting: Nurse Practitioner

## 2021-08-07 ENCOUNTER — Other Ambulatory Visit: Payer: Self-pay

## 2021-08-07 VITALS — HR 68 | Temp 97.6°F | Wt 155.0 lb

## 2021-08-07 DIAGNOSIS — U071 COVID-19: Secondary | ICD-10-CM | POA: Insufficient documentation

## 2021-08-07 DIAGNOSIS — R059 Cough, unspecified: Secondary | ICD-10-CM | POA: Diagnosis not present

## 2021-08-07 MED ORDER — MOLNUPIRAVIR EUA 200MG CAPSULE: 4.0000 | ORAL_CAPSULE | Freq: Two times a day (BID) | ORAL | 0 refills | Status: AC

## 2021-08-07 NOTE — Assessment & Plan Note (Signed)
Patient to get home test and tested positive for COVID-19.  She is vaccinated against COVID-19.  We did discuss treatment options.  Did discuss that they are both her emergency use authorized only.  After joint discussion and decision we will proceed with Molnupiravir.  Discussed side effects.  Start medication immediately.  Did discuss quarantine guidelines and after.  States that she was negative flu shot October 7.  Told her to hold off for now and see how she feels.

## 2021-08-07 NOTE — Progress Notes (Signed)
Patient ID: Dominique Hall, female    DOB: 11/13/1934, 86 y.o.   MRN: [id number]  Virtual visit completed through [place], a video enabled telemedicine application. Due to national recommendations of social distancing due to COVID-19, a virtual visit is felt to be most appropriate for this patient at this time. Reviewed limitations, risks, security and privacy concerns of performing a virtual visit and the availability of in person appointments. I also reviewed that there may be a patient responsible charge related to this service. The patient agreed to proceed.   Attempted to connect via video application. Unsuccessful and revert to phone call visit.  Patient location: home Provider location: [place] at [facility], office Persons participating in this virtual visit: patient, provider, husband  If any vitals were documented, they were collected by patient at home unless specified below.    Pulse 68 Comment: per patient  Temp 97.6 F (36.4 C) Comment: per patient  Wt 155 lb (70.3 kg)   SpO2 97% Comment: per patient  BMI 26.61 kg/m    CC: Covid 19 Infection  Subjective:   HPI: Dominique Hall is a 86 y.o. female presenting on 08/07/2021 for Covid Positive (On 08/06/21. Sx started on 08/06/21-runny nose, cough dry/nagging, headache earlier, some ear pain this morning.)  Symptoms started 08/06/2021. At home test was positive 08/06/2021. Tylenol pm a couple times. Tylenol has helped. Has been vaccinated against Covid 19 with Pfizer x 2 and a booster.   Relevant past medical, surgical, family and social history reviewed and updated as indicated. Interim medical history since our last visit reviewed. Allergies and medications reviewed and updated. Outpatient Medications Prior to Visit  Medication Sig Dispense Refill   celecoxib (CELEBREX) 200 MG capsule Take 1 capsule (200 mg total) by mouth daily. 90 capsule 3   Cholecalciferol (VITAMIN D-3) 25 MCG (1000 UT) CAPS Take 1  capsule by mouth daily.     estradiol (ESTRACE VAGINAL) 0.1 MG/GM vaginal cream Apply a pea sized amount to vagina twice weekly 42.5 g 0   metoprolol tartrate (LOPRESSOR) 25 MG tablet Take 1 tablet (25 mg total) by mouth 2 (two) times daily. 180 tablet 3   NON FORMULARY Immunity support gummies Takes 1 gummy daily.     NON FORMULARY Black elderberry gummy Takes 1 gummy qd.     Omega-3 Fatty Acids (FISH OIL PO) Take 1 capsule by mouth daily.     omeprazole (PRILOSEC) 20 MG capsule TAKE 1 CAPSULE(20 MG) BY MOUTH DAILY 90 capsule 3   vitamin C (ASCORBIC ACID) 500 MG tablet Take 500 mg by mouth daily.     No facility-administered medications prior to visit.     Per HPI unless specifically indicated in ROS section below Review of Systems  Constitutional:  Negative for chills, fatigue and fever.  HENT:  Positive for ear pain (right) and rhinorrhea. Negative for sore throat.   Respiratory:  Positive for cough (clear). Negative for shortness of breath.   Cardiovascular:  Negative for chest pain.  Gastrointestinal:  Negative for abdominal pain, diarrhea, nausea and vomiting.  Neurological:  Positive for headaches.  Objective:  Pulse 68 Comment: per patient  Temp 97.6 F (36.4 C) Comment: per patient  Wt 155 lb (70.3 kg)   SpO2 97% Comment: per patient  BMI 26.61 kg/m   Wt Readings from Last 3 Encounters:  08/07/21 155 lb (70.3 kg)  07/27/21 155 lb (70.3 kg)  07/18/21 155 lb 6 oz (70.5 kg)  Physical exam: Gen: alert, NAD, not ill appearing Pulm: speaks in complete sentences without increased work of breathing Psych: normal mood, normal thought content      Results for orders placed or performed during the hospital encounter of 05/17/21  Urine Culture   Specimen: Urine, Random  Result Value Ref Range   Specimen Description      URINE, RANDOM Performed at [facility], [address] [place], [place] [address]    Special Requests      NONE Performed at [facility], [address] [place], [place] [address]    Culture (A)     <10,000 COLONIES/mL INSIGNIFICANT GROWTH Performed at [facility] [address], [place], [place] [address]    Report Status 05/19/2021 FINAL   Urinalysis, Complete w Microscopic  Result Value Ref Range   Color, Urine YELLOW (A) YELLOW   APPearance CLOUDY (A) CLEAR   Specific Gravity, Urine 1.024 1.005 - 1.030   pH 5.0 5.0 - 8.0   Glucose, UA NEGATIVE NEGATIVE mg/dL   Hgb urine dipstick NEGATIVE NEGATIVE   Bilirubin Urine NEGATIVE NEGATIVE   Ketones, ur NEGATIVE NEGATIVE mg/dL   Protein, ur 30 (A) NEGATIVE mg/dL   Nitrite NEGATIVE NEGATIVE   Leukocytes,Ua LARGE (A) NEGATIVE   RBC / HPF 11-20 0 - 5 RBC/hpf   WBC, UA 21-50 0 - 5 WBC/hpf   Bacteria, UA NONE SEEN NONE SEEN   Squamous Epithelial / LPF 0-5 0 - 5   Mucus PRESENT   Basic metabolic panel  Result Value Ref Range   Sodium 140 135 - 145 mmol/L   Potassium 3.6 3.5 - 5.1 mmol/L   Chloride 108 98 - 111 mmol/L   CO2 26 22 - 32 mmol/L   Glucose, Bld 110 (H) 70 - 99 mg/dL   BUN 27 (H) 8 - 23 mg/dL   Creatinine, Ser 0.73 0.44 - 1.00 mg/dL   Calcium 9.3 8.9 - 10.3 mg/dL   GFR, Estimated >60 >60 mL/min   Anion gap 6 5 - 15  CBC  Result Value Ref Range   WBC 13.5 (H) 4.0 - 10.5 K/uL   RBC 4.22 3.87 - 5.11 MIL/uL   Hemoglobin 12.6 12.0 - 15.0 g/dL   HCT 36.3 36.0 - 46.0 %   MCV 86.0 80.0 - 100.0 fL   MCH 29.9 26.0 - 34.0 pg   MCHC 34.7 30.0 - 36.0 g/dL   RDW 13.0 11.5 - 15.5 %   Platelets 154 150 - 400 K/uL   nRBC 0.0 0.0 - 0.2 %   Assessment & Plan:   Problem List Items Addressed This Visit       Other   Cough   COVID-19 - Primary    Patient to get home test and tested positive for COVID-19.  She is vaccinated against COVID-19.  We did discuss treatment options.  Did discuss that they are both her emergency use authorized only.  After joint discussion and decision we will proceed with Molnupiravir.  Discussed side effects.  Start  medication immediately.  Did discuss quarantine guidelines and after.  States that she was negative flu shot October 7.  Told her to hold off for now and see how she feels.      Relevant Medications   molnupiravir EUA (LAGEVRIO) 200 mg CAPS capsule     No orders of the defined types were placed in this encounter.  No orders of the defined types were placed in this encounter.  Phone call length 12 mins and 49 seconds  I discussed the assessment and treatment plan with the patient. The patient was provided an opportunity to ask questions and all were answered. The patient agreed with the plan and demonstrated an understanding of the instructions. The patient was advised to call back or seek an in-person evaluation if the symptoms worsen or if the condition fails to improve as anticipated.  Follow up plan: No follow-ups on file.  Romilda Garret, NP

## 2021-08-11 DIAGNOSIS — M159 Polyosteoarthritis, unspecified: Secondary | ICD-10-CM | POA: Diagnosis not present

## 2021-08-11 DIAGNOSIS — I10 Essential (primary) hypertension: Secondary | ICD-10-CM | POA: Diagnosis not present

## 2021-08-18 ENCOUNTER — Telehealth: Payer: Self-pay

## 2021-08-18 ENCOUNTER — Ambulatory Visit: Payer: Medicare Other

## 2021-08-18 NOTE — Telephone Encounter (Signed)
Spoke to pt's husband. He said she is not in need of anything right now.

## 2021-08-18 NOTE — Telephone Encounter (Signed)
Venia Carbon, MD  You 6 hours ago (8:45 AM)   Find out what symptoms they are having---probably still COVID and not bacterial  Change this into a phone note       You routed conversation to [name] I, MD 6 hours ago (8:11 AM)   Vennie Homans E routed conversation to You 22 hours ago (4:56 PM)   [place], [place] (supporting Mychart, Generic) 22 hours ago (4:50 PM)   Seeing that we were to have our shots Oct. 7th, [place] can you check with Dr. Silvio Pate and see when he suggests that we get these and the fact that both Mikki Santee and I still have this [place].  I am going to recheck mine tonight but Mikki Santee and I both have the same symptoms, no energy, vitals seems to be holding its own and we both still have that cough, Bob's worse than mine.  If you could check with Dr. Silvio Pate on this, I would appreciate it.  I still think we might need an antibiotic to get this all cleared out and then we can work on getting all the symptoms back in order.  I'm sorry I didn't answer the phone when you called today.  Thanks and I hope to hear from you before the week-end, PLEASE!!!!!!

## 2021-08-22 ENCOUNTER — Other Ambulatory Visit: Payer: Self-pay

## 2021-08-22 ENCOUNTER — Ambulatory Visit (INDEPENDENT_AMBULATORY_CARE_PROVIDER_SITE_OTHER): Payer: Medicare Other | Admitting: Family Medicine

## 2021-08-22 VITALS — BP 122/80 | HR 67 | Temp 97.0°F | Ht 64.0 in | Wt 152.2 lb

## 2021-08-22 DIAGNOSIS — R42 Dizziness and giddiness: Secondary | ICD-10-CM | POA: Diagnosis not present

## 2021-08-22 NOTE — Progress Notes (Signed)
Subjective:     Dominique Hall is a 86 y.o. female presenting for Dizziness (Every morning after breakfast. States that it takes awhile to recover from it. Was on Tranxene and it helped her symptoms but it made her very confused. She would like to try something else. )     Dizziness   #Dizziness - symptoms x years - in the morning after eating breakfast will get lightheaded - would take tranxene with improvement in the dizziness - her blood pressure would drop and would feel like she was going to faint - if she sits down she feels better - she calls them "spells" - denies anxiety or worry during the episodes - only in the mornings - in the afternoon no issues with position changes - does not occur with getting out of bed  Takes metoprolol for SVT and HTN - 6 months of metoprolol - toast/jelly, scrambled egg most days - light breakfast  - no water with breakfast - diet Dr. Malachi Bonds  24-30 oz per day of water   Review of Systems  Neurological:  Positive for dizziness.    Social History   Tobacco Use  Smoking Status Former  Smokeless Tobacco Never        Objective:    BP Readings from Last 3 Encounters:  08/22/21 122/80  07/27/21 130/86  07/25/21 138/77   Wt Readings from Last 3 Encounters:  08/22/21 152 lb 4 oz (69.1 kg)  08/07/21 155 lb (70.3 kg)  07/27/21 155 lb (70.3 kg)    BP 122/80   Pulse 67   Temp (!) 97 F (36.1 C) (Temporal)   Ht 5\' 4"  (1.626 m)   Wt 152 lb 4 oz (69.1 kg)   SpO2 97%   BMI 26.13 kg/m    Physical Exam Constitutional:      General: She is not in acute distress.    Appearance: She is well-developed. She is not diaphoretic.  HENT:     Right Ear: External ear normal.     Left Ear: External ear normal.  Eyes:     Conjunctiva/sclera: Conjunctivae normal.  Cardiovascular:     Rate and Rhythm: Normal rate and regular rhythm.  Pulmonary:     Effort: Pulmonary effort is normal. No respiratory distress.     Breath  sounds: Normal breath sounds. No wheezing.  Musculoskeletal:     Cervical back: Neck supple.  Skin:    General: Skin is warm and dry.     Capillary Refill: Capillary refill takes less than 2 seconds.  Neurological:     Mental Status: She is alert. Mental status is at baseline.  Psychiatric:        Mood and Affect: Mood normal.        Behavior: Behavior normal.          Assessment & Plan:   Problem List Items Addressed This Visit       Other   Postural dizziness - Primary    Suspect pt with postprandial hypotension. Orthostatics are wnl however SBP decreased by 16 points. Advised continued AM caffeine, light meals. But to add 8 oz of water in the AM and sit for 15 minutes after meal. Explained would ideally avoid medication as this could raise BP -- but pt with hx of stopping HCTZ 2/2 to orthostatic bp. F/u with cardiology or pcp is symptom not improving        Return if symptoms worsen or fail to improve.  Lesleigh Noe,  MD  This visit occurred during the SARS-CoV-2 public health emergency.  Safety protocols were in place, including screening questions prior to the visit, additional usage of staff PPE, and extensive cleaning of exam room while observing appropriate contact time as indicated for disinfecting solutions.

## 2021-08-22 NOTE — Patient Instructions (Addendum)
#   Low blood pressure after eating (Postprandial Hypotension) - Drink 8 ounces of water with breakfast - Sit for 15 minutes after eating before standing up

## 2021-08-22 NOTE — Assessment & Plan Note (Signed)
Suspect pt with postprandial hypotension. Orthostatics are wnl however SBP decreased by 16 points. Advised continued AM caffeine, light meals. But to add 8 oz of water in the AM and sit for 15 minutes after meal. Explained would ideally avoid medication as this could raise BP -- but pt with hx of stopping HCTZ 2/2 to orthostatic bp. F/u with cardiology or pcp is symptom not improving

## 2021-09-08 ENCOUNTER — Ambulatory Visit: Payer: Medicare Other

## 2021-09-18 ENCOUNTER — Telehealth: Payer: Self-pay | Admitting: Cardiovascular Disease

## 2021-09-18 NOTE — Telephone Encounter (Signed)
Rx for metoprolol 25mg  BID sent to pharmacy on 07/18/21 (1 year supply)

## 2021-09-18 NOTE — Telephone Encounter (Signed)
*  STAT* If patient is at the pharmacy, call can be transferred to refill team.   1. Which medications need to be refilled? (please list name of each medication and dose if known)   Metoprolol 25 mg po BID  2. Which pharmacy/location (including street and city if local pharmacy) is medication to be sent to? [facility] [address]   3. Do they need a 30 day or 90 day supply? 90  Recent dose change send update

## 2021-09-20 NOTE — Telephone Encounter (Signed)
Patient spouse calling Patient is out of medication and needs something sent in  States that pharmacy will not fill

## 2021-09-21 MED ORDER — METOPROLOL TARTRATE 25 MG PO TABS: 25.0000 mg | ORAL_TABLET | Freq: Two times a day (BID) | ORAL | 3 refills | Status: DC

## 2021-09-21 NOTE — Telephone Encounter (Signed)
Requested Prescriptions   Signed Prescriptions Disp Refills   metoprolol tartrate (LOPRESSOR) 25 MG tablet 180 tablet 3    Sig: Take 1 tablet (25 mg total) by mouth 2 (two) times daily.    Authorizing Provider: Minna Merritts    Ordering User: Britt Bottom

## 2021-09-21 NOTE — Telephone Encounter (Signed)
Patient spouse calling to ask [facility] to reach out to [facility].  They are telling patient this was not received and he is asking to resend .

## 2021-09-22 ENCOUNTER — Other Ambulatory Visit: Payer: Self-pay

## 2021-09-22 ENCOUNTER — Ambulatory Visit (INDEPENDENT_AMBULATORY_CARE_PROVIDER_SITE_OTHER): Payer: Medicare Other

## 2021-09-22 DIAGNOSIS — Z23 Encounter for immunization: Secondary | ICD-10-CM

## 2021-09-25 ENCOUNTER — Ambulatory Visit: Payer: Medicare Other | Admitting: Internal Medicine

## 2021-09-26 ENCOUNTER — Encounter: Payer: Self-pay | Admitting: Internal Medicine

## 2021-09-26 ENCOUNTER — Other Ambulatory Visit: Payer: Self-pay

## 2021-09-26 ENCOUNTER — Ambulatory Visit (INDEPENDENT_AMBULATORY_CARE_PROVIDER_SITE_OTHER): Payer: Medicare Other | Admitting: Internal Medicine

## 2021-09-26 ENCOUNTER — Telehealth: Payer: Self-pay | Admitting: Internal Medicine

## 2021-09-26 VITALS — BP 138/78 | HR 75 | Temp 97.9°F | Ht 64.0 in | Wt 153.0 lb

## 2021-09-26 DIAGNOSIS — R310 Gross hematuria: Secondary | ICD-10-CM | POA: Insufficient documentation

## 2021-09-26 LAB — POC URINALSYSI DIPSTICK (AUTOMATED)
Bilirubin, UA: NEGATIVE
Glucose, UA: NEGATIVE
Ketones, UA: NEGATIVE
Nitrite, UA: NEGATIVE
Protein, UA: POSITIVE — AB
Spec Grav, UA: 1.02 (ref 1.010–1.025)
Urobilinogen, UA: 1 E.U./dL
pH, UA: 6.5 (ref 5.0–8.0)

## 2021-09-26 MED ORDER — NITROFURANTOIN MONOHYD MACRO 100 MG PO CAPS: 100.0000 mg | ORAL_CAPSULE | Freq: Two times a day (BID) | ORAL | 0 refills | Status: DC

## 2021-09-26 NOTE — Telephone Encounter (Signed)
Pt called stated she was triage by access nurse for blood in her urine and need to seen in office within 24 hurs . No appointment available

## 2021-09-26 NOTE — Progress Notes (Signed)
Subjective:    Patient ID: Dominique Hall, female    DOB: 04-16-1935, 86 y.o.   MRN: [id number]  HPI Here due to blood in her urine This visit occurred during the SARS-CoV-2 public health emergency.  Safety protocols were in place, including screening questions prior to the visit, additional usage of staff PPE, and extensive cleaning of exam room while observing appropriate contact time as indicated for disinfecting solutions.   No dysuria but noticed some abnormal color in urine 2 nights ago Notices it looking "plum" colored No urgency  No back pain No fever  Current Outpatient Medications on File Prior to Visit  Medication Sig Dispense Refill   celecoxib (CELEBREX) 200 MG capsule Take 1 capsule (200 mg total) by mouth daily. 90 capsule 3   Cholecalciferol (VITAMIN D-3) 25 MCG (1000 UT) CAPS Take 1 capsule by mouth daily.     estradiol (ESTRACE) 0.1 MG/GM vaginal cream APPLY PEA SIZED AMOUNT VAGINALLY 2 TIMES A WEEK 42.5 g 3   metoprolol tartrate (LOPRESSOR) 25 MG tablet Take 1 tablet (25 mg total) by mouth 2 (two) times daily. 180 tablet 3   NON FORMULARY Immunity support gummies Takes 1 gummy daily.     NON FORMULARY Black elderberry gummy Takes 1 gummy qd.     Omega-3 Fatty Acids (FISH OIL PO) Take 1 capsule by mouth daily.     omeprazole (PRILOSEC) 20 MG capsule TAKE 1 CAPSULE(20 MG) BY MOUTH DAILY 90 capsule 3   vitamin C (ASCORBIC ACID) 500 MG tablet Take 500 mg by mouth daily.     No current facility-administered medications on file prior to visit.    Allergies  Allergen Reactions   Penicillins     Has patient had a PCN reaction causing immediate rash, facial/tongue/throat swelling, SOB or lightheadedness with hypotension: Unknown Has patient had a PCN reaction causing severe rash involving mucus membranes or skin necrosis: Unknown Has patient had a PCN reaction that required hospitalization: Unknown Has patient had a PCN reaction occurring within the last 10  years: Unknown If all of the above answers are "NO", then may proceed with Cephalosporin use.   Rabeprazole Sodium     REACTION: constipated   Sulfonamide Derivatives     REACTION: unspecified   Cefuroxime     diarrhea    Past Medical History:  Diagnosis Date   Allergy    Arthritis    Chest pain    a. 07/2019 MV: No ischemia/infarct. Low risk; b. 04/2021 MV: No isch/scar. EF 60%. No significant cor Ca2+. Developed atrial tachycardia during study.   Diverticulosis    Esophageal dysmotility    Esophageal ring    GERD (gastroesophageal reflux disease)    Hiatal hernia    HTN (hypertension)    IBS (irritable bowel syndrome)    Osteopenia    PAH (pulmonary artery hypertension) ([place])    a. 07/2019 Echo: EF 60-65%, impaired relaxation. Nl RV size/fxn. PASP 73mmHg. Mildly dil LA.   Palpitations    Pre-syncope    Renal disorder    Sleep disturbance     Past Surgical History:  Procedure Laterality Date   ABDOMINAL HYSTERECTOMY     APPENDECTOMY  1973   BREAST EXCISIONAL BIOPSY Right    CHOLECYSTECTOMY     ERCP N/A 11/22/2017   Procedure: ENDOSCOPIC RETROGRADE CHOLANGIOPANCREATOGRAPHY (ERCP);  Surgeon: Lucilla Lame, MD;  Location: [facility] ENDOSCOPY;  Service: Endoscopy;  Laterality: N/A;   ESOPHAGOGASTRODUODENOSCOPY  07-2006   with dilation   KIDNEY SURGERY  1970   lumpectomy  0-2409   benign right breast   SQUAMOUS CELL CARCINOMA EXCISION  6/12   Right leg--Dr Cresenciano Lick CELL CARCINOMA EXCISION  2012   right calf   TOTAL ABDOMINAL HYSTERECTOMY W/ BILATERAL SALPINGOOPHORECTOMY  1973    Family History  Problem Relation Age of Onset   Heart attack Father    Stroke Mother    Melanoma Daughter     Social History   Socioeconomic History   Marital status: Married    Spouse name: Not on file   Number of children: 2   Years of education: Not on file   Highest education level: Not on file  Occupational History   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  Tobacco Use   Smoking status:  Former   Smokeless tobacco: Never  Substance and Sexual Activity   Alcohol use: Not Currently    Alcohol/week: 1.0 standard drink    Types: 1 Glasses of wine per week    Comment: occ   Drug use: No   Sexual activity: Not on file  Other Topics Concern   Not on file  Social History Narrative   Has living will   Husband is [place] health care POA---alternate is daughters Lynne/Amanda   Would accept resuscitation but no prolonged ventilation   Probably would not want tube feedings if cognitively unaware   Social Determinants of Health   Financial Resource Strain: Low Risk    Difficulty of Paying Living Expenses: Not very hard  Food Insecurity: Not on file  Transportation Needs: Not on file  Physical Activity: Not on file  Stress: Not on file  Social Connections: Not on file  Intimate Partner Violence: Not on file   Review of Systems Did have ER visit in July--records say flank pain (she doesn't remember) Non obstructing stones seen    Objective:   Physical Exam Constitutional:      Appearance: Normal appearance.  Abdominal:     Palpations: Abdomen is soft.     Tenderness: There is no abdominal tenderness. There is no right CVA tenderness or left CVA tenderness.  Neurological:     Mental Status: She is alert.           Assessment & Plan:

## 2021-09-26 NOTE — Telephone Encounter (Signed)
Spoke to patient by telephone and was advised that she first noticed blood in her urine yesterday. Patient stated that she has a history of UTI's and kidney stones. Patient scheduled an appointment today with Dr. Silvio Pate today 09/26/21 at 12:00 noon.

## 2021-09-26 NOTE — Telephone Encounter (Signed)
PLEASE NOTE: All timestamps contained within this report are represented as [place] Standard Time. CONFIDENTIALTY NOTICE: This fax transmission is intended only for the addressee. It contains information that is legally privileged, confidential or otherwise protected from use or disclosure. If you are not the intended recipient, you are strictly prohibited from reviewing, disclosing, copying using or disseminating any of this information or taking any action in reliance on or regarding this information. If you have received this fax in error, please notify us immediately by telephone so that we can arrange for its return to [place]. Phone: [phone], Toll-Free: [phone], Fax: [phone] Page: 1 of 2 Call Id: [id number] [place] [facility] - Client TELEPHONE ADVICE RECORD [facility] Patient Name: Dominique Hall Gender: Female DOB: Dec 07, 1934 Age: 86 Y 11 M 4 D Return Phone Number: [phone] (Primary), [phone] (Secondary) Address: [place] [address] Client [place] Night - Client Client Site [place] Physician [name]- MD Contact Type Call Who Is Calling Patient / Member / Family / Caregiver Call Type Triage / Clinical Caller Name Haruye Lainez Relationship To Patient Spouse Return Phone Number [phone] (Primary) Chief Complaint Urine, Blood In Reason for Call Symptomatic / Request for [place] states his wife has a lot of blood in her urine and would like to scheudle an appointment , He elected triage as office did not answer phone call. Translation No Nurse Assessment Nurse: Loletha Carrow, RN, Ronalee Belts Date/Time (Eastern Time): 09/26/2021 9:46:05 AM Confirm and document reason for call. If symptomatic, describe symptoms. ---Caller states: Blood in Urine X 1 days. Denies any other s/s Does the patient have any new or worsening symptoms?  ---Yes Will a triage be completed? ---Yes Related visit to physician within the last 2 weeks? ---No Does the PT have any chronic conditions? (i.e. diabetes, asthma, this includes High risk factors for pregnancy, etc.) ---Yes List chronic conditions. ---Kidney Is this a behavioral health or substance abuse call? ---No Guidelines Guideline Title Affirmed Question Affirmed Notes Nurse Date/Time (Eastern Time) Urine - Blood In Blood in urine (Exception: could be normal menstrual bleeding) Emch, RN, Ronalee Belts 09/26/2021 9:48:10 AM Disp. Time Eilene Ghazi Time) Disposition Final User 09/26/2021 9:52:09 AM See PCP within 24 Hours Yes Emch, RN, Ronalee Belts PLEASE NOTE: All timestamps contained within this report are represented as [place] Standard Time. CONFIDENTIALTY NOTICE: This fax transmission is intended only for the addressee. It contains information that is legally privileged, confidential or otherwise protected from use or disclosure. If you are not the intended recipient, you are strictly prohibited from reviewing, disclosing, copying using or disseminating any of this information or taking any action in reliance on or regarding this information. If you have received this fax in error, please notify us immediately by telephone so that we can arrange for its return to [place]. Phone: [phone], Toll-Free: [phone], Fax: [phone] Page: 2 of 2 Call Id: [id number] [place] Disagree/Comply Comply Caller Understands Yes PreDisposition Did not know what to do Care Advice Given Per Guideline SEE PCP WITHIN 24 HOURS: * IF OFFICE WILL BE OPEN: You need to be examined within the next 24 hours. Call your doctor (or NP/PA) when the office opens and make an appointment. SAMPLE: * Bring in a sample of the bloody urine. * Keep it in the refrigerator until you leave. DRINK EXTRA FLUIDS: * Drink extra fluids. * Drink 8 to 10 cups (1,800 to 2,400 ml) of liquids a day. CALL  BACK IF: * Fever or pain occurs * You  become worse CARE ADVICE given per Urine, Blood In (Adult) guideline. Referrals REFERRED TO PCP OFFICE

## 2021-09-26 NOTE — Assessment & Plan Note (Signed)
No symptoms of cystitis but will give 3 days of antibiotic---just in case Has known non obstructing kidney stones---last seen on CT in July (so no need to reimage kidneys at this point) Should see urologist for cystoscopy to be sure we are not missing a bladder lesion

## 2021-09-26 NOTE — Telephone Encounter (Signed)
Okay to add on. Will check urine and evaluate her to see how to deal with this

## 2021-09-29 ENCOUNTER — Ambulatory Visit: Payer: Medicare Other | Admitting: Internal Medicine

## 2021-10-01 ENCOUNTER — Encounter: Payer: Self-pay | Admitting: Internal Medicine

## 2021-10-12 ENCOUNTER — Ambulatory Visit: Payer: Self-pay | Admitting: Urology

## 2021-11-02 ENCOUNTER — Telehealth: Payer: Self-pay

## 2021-11-02 NOTE — Telephone Encounter (Signed)
Per appt notes pt already scheduled with Dr Silvio Pate for 11/03/21 at 10:15.sending note to Dr Silvio Pate and Larene Beach CMA.

## 2021-11-02 NOTE — Telephone Encounter (Signed)
[name] - Client TELEPHONE ADVICE RECORD [facility] Patient Name: Dominique [name] Gender: Female DOB: [birth year] Age: 86 Y 6 M 11 D Return Phone Number: [phone] (Primary), [phone] (Secondary) Address: [name] [address] Client Dominique [name] - Client Client Site [place] - [name] Provider [name] Contact Type Call Who Is Calling Patient / Member / Family / Caregiver Call Type Triage / Clinical Relationship To Patient Self Return Phone Number [phone] (Primary) Chief Complaint Urine, Blood In Reason for Call Symptomatic / Request for Health Information Initial Comment Caller states,pt has blood in urine, thinks UTI. Translation No Nurse Assessment Nurse: Dominique Reaper, RN, Dominique [name] Date/Time (Eastern Time): 11/02/2021 8:18:22 AM Confirm and document reason for call. If symptomatic, describe symptoms. ---Caller states she may have a UTI, has blood in her urine that started yesterday, it happened 3 times, has some burning with urination, denies flank/back pain and other symptoms, Does the patient have any new or worsening symptoms? ---Yes Will a triage be completed? ---Yes Related visit to physician within the last 2 weeks? ---No Does the PT have any chronic conditions? (i.e. diabetes, asthma, this includes High risk factors for pregnancy, etc.) ---No Is this a behavioral health or substance abuse call? ---No Guidelines Guideline Title Affirmed Question Affirmed Notes Nurse Date/Time (Eastern Time) Urine - Blood In Pain or burning with passing urine Dominique Reaper, RN, Dominique [name] 11/02/2021 8:22:05 AM Disp. Time Dominique [name] Time) Disposition Final User 11/02/2021 8:24:46 AM See PCP within 24 Hours Yes Dominique Reaper, RN, Dominique [name] Disagree/Comply Comply Caller Understands Yes PLEASE NOTE: All timestamps contained within this report are represented as [place] Standard  Time. CONFIDENTIALTY NOTICE: This fax transmission is intended only for the addressee. It contains information that is legally privileged, confidential or otherwise protected from use or disclosure. If you are not the intended recipient, you are strictly prohibited from reviewing, disclosing, copying using or disseminating any of this information or taking any action in reliance on or regarding this information. If you have received this fax in error, please notify us immediately by telephone so that we can arrange for its return to [place]. Phone: [phone], Toll-Free: [phone], Fax: [phone] Page: 2 of 2 Call Id: [id number] PreDisposition Call Doctor Care Advice Given Per Guideline SEE PCP WITHIN 24 HOURS: * IF OFFICE WILL BE OPEN: You need to be examined within the next 24 hours. Call your doctor (or NP/PA) when the office opens and make an appointment. DRINK EXTRA FLUIDS: * Drink extra fluids. PAIN MEDICINES: * For pain relief, you can take either acetaminophen, ibuprofen, or naproxen. * They are over-the-counter (OTC) pain drugs. You can buy them at the drugstore. * IBUPROFEN (E.G., MOTRIN, ADVIL): Take 400 mg (two 200 mg pills) by mouth every 6 hours. The most you should take is 6 pills a [name] (1,200 mg total). SAMPLE: * Bring in a sample of the bloody urine. * Keep it in the refrigerator until you leave. * Fever occurs * You become worse CALL BACK IF: Comments User: Dominique Sis, RN Date/Time (Eastern Time): 11/02/2021 8:25:44 AM Warm transferred to appt desk and instructed husband to let them know he has spoke with a nurse and will need to be seen in the next 24 hours Referrals Warm transfer to backlin

## 2021-11-02 NOTE — Telephone Encounter (Signed)
Spoke to pt and husband to make sure she can wait until tomorrow and to find out why she canceled the urology appt 10-12-21. She said she was not having the hematuria at the time of the appt so she canceled.

## 2021-11-03 ENCOUNTER — Other Ambulatory Visit: Payer: Self-pay

## 2021-11-03 ENCOUNTER — Encounter: Payer: Self-pay | Admitting: Internal Medicine

## 2021-11-03 ENCOUNTER — Ambulatory Visit (INDEPENDENT_AMBULATORY_CARE_PROVIDER_SITE_OTHER): Payer: Medicare Other | Admitting: Internal Medicine

## 2021-11-03 VITALS — BP 124/78 | HR 66 | Temp 97.3°F | Ht 64.0 in | Wt 152.0 lb

## 2021-11-03 DIAGNOSIS — R310 Gross hematuria: Secondary | ICD-10-CM | POA: Diagnosis not present

## 2021-11-03 DIAGNOSIS — R319 Hematuria, unspecified: Secondary | ICD-10-CM

## 2021-11-03 LAB — POC URINALSYSI DIPSTICK (AUTOMATED)
Bilirubin, UA: NEGATIVE
Glucose, UA: NEGATIVE
Ketones, UA: NEGATIVE
Nitrite, UA: NEGATIVE
Protein, UA: POSITIVE — AB
Spec Grav, UA: 1.015 (ref 1.010–1.025)
Urobilinogen, UA: 0.2 E.U./dL
pH, UA: 6.5 (ref 5.0–8.0)

## 2021-11-03 MED ORDER — NITROFURANTOIN MONOHYD MACRO 100 MG PO CAPS: 100.0000 mg | ORAL_CAPSULE | Freq: Two times a day (BID) | ORAL | 2 refills | Status: DC

## 2021-11-03 NOTE — Progress Notes (Signed)
Subjective:    Patient ID: Dominique Hall, female    DOB: June 06, 1935, 86 y.o.   MRN: [id number]  HPI Here due to blood in her urine again  Once again---having gross blood in urine No significant dysuria No urgency No fever No back pain  Current Outpatient Medications on File Prior to Visit  Medication Sig Dispense Refill   celecoxib (CELEBREX) 200 MG capsule Take 1 capsule (200 mg total) by mouth daily. 90 capsule 3   Cholecalciferol (VITAMIN D-3) 25 MCG (1000 UT) CAPS Take 1 capsule by mouth daily.     estradiol (ESTRACE) 0.1 MG/GM vaginal cream APPLY PEA SIZED AMOUNT VAGINALLY 2 TIMES A WEEK 42.5 g 3   metoprolol tartrate (LOPRESSOR) 25 MG tablet Take 1 tablet (25 mg total) by mouth 2 (two) times daily. 180 tablet 3   NON FORMULARY Immunity support gummies Takes 1 gummy daily.     NON FORMULARY Black elderberry gummy Takes 1 gummy qd.     Omega-3 Fatty Acids (FISH OIL PO) Take 1 capsule by mouth daily.     omeprazole (PRILOSEC) 20 MG capsule TAKE 1 CAPSULE(20 MG) BY MOUTH DAILY 90 capsule 3   vitamin C (ASCORBIC ACID) 500 MG tablet Take 500 mg by mouth daily.     No current facility-administered medications on file prior to visit.    Allergies  Allergen Reactions   Penicillins     Has patient had a PCN reaction causing immediate rash, facial/tongue/throat swelling, SOB or lightheadedness with hypotension: Unknown Has patient had a PCN reaction causing severe rash involving mucus membranes or skin necrosis: Unknown Has patient had a PCN reaction that required hospitalization: Unknown Has patient had a PCN reaction occurring within the last 10 years: Unknown If all of the above answers are "NO", then may proceed with Cephalosporin use.   Rabeprazole Sodium     REACTION: constipated   Sulfonamide Derivatives     REACTION: unspecified   Cefuroxime     diarrhea    Past Medical History:  Diagnosis Date   Allergy    Arthritis    Chest pain    a. 07/2019 MV: No  ischemia/infarct. Low risk; b. 04/2021 MV: No isch/scar. EF 60%. No significant cor Ca2+. Developed atrial tachycardia during study.   Diverticulosis    Esophageal dysmotility    Esophageal ring    GERD (gastroesophageal reflux disease)    Hiatal hernia    HTN (hypertension)    IBS (irritable bowel syndrome)    Osteopenia    PAH (pulmonary artery hypertension) ([place])    a. 07/2019 Echo: EF 60-65%, impaired relaxation. Nl RV size/fxn. PASP 55mmHg. Mildly dil LA.   Palpitations    Pre-syncope    Renal disorder    Sleep disturbance     Past Surgical History:  Procedure Laterality Date   ABDOMINAL HYSTERECTOMY     APPENDECTOMY  1973   BREAST EXCISIONAL BIOPSY Right    CHOLECYSTECTOMY     ERCP N/A 11/22/2017   Procedure: ENDOSCOPIC RETROGRADE CHOLANGIOPANCREATOGRAPHY (ERCP);  Surgeon: Lucilla Lame, MD;  Location: [facility] ENDOSCOPY;  Service: Endoscopy;  Laterality: N/A;   ESOPHAGOGASTRODUODENOSCOPY  07-2006   with dilation   KIDNEY SURGERY  1970   lumpectomy  02-1991   benign right breast   SQUAMOUS CELL CARCINOMA EXCISION  6/12   Right leg--Dr Cresenciano Lick CELL CARCINOMA EXCISION  2012   right calf   TOTAL ABDOMINAL HYSTERECTOMY W/ BILATERAL SALPINGOOPHORECTOMY  1973    Family History  Problem Relation Age of Onset   Heart attack Father    Stroke Mother    Melanoma Daughter     Social History   Socioeconomic History   Marital status: Married    Spouse name: Not on file   Number of children: 2   Years of education: Not on file   Highest education level: Not on file  Occupational History   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  Tobacco Use   Smoking status: Former   Smokeless tobacco: Never  Substance and Sexual Activity   Alcohol use: Not Currently    Alcohol/week: 1.0 standard drink    Types: 1 Glasses of wine per week    Comment: occ   Drug use: No   Sexual activity: Not on file  Other Topics Concern   Not on file  Social History Narrative   Has living will   Husband  is [place] health care POA---alternate is daughters Lynne/Amanda   Would accept resuscitation but no prolonged ventilation   Probably would not want tube feedings if cognitively unaware   Social Determinants of Health   Financial Resource Strain: Low Risk    Difficulty of Paying Living Expenses: Not very hard  Food Insecurity: Not on file  Transportation Needs: Not on file  Physical Activity: Not on file  Stress: Not on file  Social Connections: Not on file  Intimate Partner Violence: Not on file    Review of Systems She canceled the urology visit due to resolution of symptoms after antibiotics No N/V Eating okay    Objective:   Physical Exam Constitutional:      Appearance: Normal appearance.  Abdominal:     Palpations: Abdomen is soft.     Tenderness: There is no abdominal tenderness.  Neurological:     Mental Status: She is alert.           Assessment & Plan:

## 2021-11-03 NOTE — Addendum Note (Signed)
Addended by: Pilar Grammes on: 11/03/2021 11:21 AM   Modules accepted: Orders

## 2021-11-03 NOTE — Assessment & Plan Note (Addendum)
Has recurred just like last month Completely resolved the last time with the antibiotics so she canceled the urology visit  Will give macrobid again Asked her to reschedule with urology to have cystoscopy  Told her to start the estrogen cream twice a week

## 2021-11-03 NOTE — Patient Instructions (Signed)
Please call [facility] to reschedule your appointment (you should have a cystoscopy to be absolutely sure there is not something abnormal in your bladder).

## 2021-11-04 LAB — URINE CULTURE
MICRO NUMBER:: 12795424
SPECIMEN QUALITY:: ADEQUATE

## 2021-11-15 ENCOUNTER — Encounter: Payer: Self-pay | Admitting: Internal Medicine

## 2021-11-15 NOTE — Telephone Encounter (Signed)
Spoke to pt. She does not see urology until 11-21-21. She thinks she needs more antibiotics to help until then. Having urinary frequency, urgency, and seeding blood in urine. Send to [employer].

## 2021-11-20 NOTE — Progress Notes (Addendum)
11/21/21 3:12 PM   Dominique Hall [birth year] [id number]  Referring provider:  Venia Carbon, MD [address] [place],  [place] [address] Chief Complaint  Patient presents with   Hematuria     HPI: Dominique Hall is a 87 y.o.female who presents today for further evaluation of hematuria.   She has a CT renal stone study to evaluate flank pain on 05/17/2021 that visualized bilateral nonobstructing renal calculi, measuring up to 7 mm in the left lower pole. No ureteral or bladder calculi. No hydronephrosis.   She was seen by her PCP, Dr.Letvak, on 11/03/2021 for unexplained hematuria. Urinalysis was negative for infection so was urine culture.  She was prescribed Macrobid.   She reports that the blood in her urine is waxing and waning. She denies any pain during urination. She has history of kidney stones she reports she has passed 11 stones. She does not feel like she is passing a kidney stone.   She is a former smoker 3/4 a pack daily.   PMH: Past Medical History:  Diagnosis Date   Allergy    Arthritis    Chest pain    a. 07/2019 MV: No ischemia/infarct. Low risk; b. 04/2021 MV: No isch/scar. EF 60%. No significant cor Ca2+. Developed atrial tachycardia during study.   Diverticulosis    Esophageal dysmotility    Esophageal ring    GERD (gastroesophageal reflux disease)    Hiatal hernia    HTN (hypertension)    IBS (irritable bowel syndrome)    Osteopenia    PAH (pulmonary artery hypertension) ([place])    a. 07/2019 Echo: EF 60-65%, impaired relaxation. Nl RV size/fxn. PASP 75mmHg. Mildly dil LA.   Palpitations    Pre-syncope    Renal disorder    Sleep disturbance     Surgical History: Past Surgical History:  Procedure Laterality Date   ABDOMINAL HYSTERECTOMY     APPENDECTOMY  1973   BREAST EXCISIONAL BIOPSY Right    CHOLECYSTECTOMY     ERCP N/A 11/22/2017   Procedure: ENDOSCOPIC RETROGRADE CHOLANGIOPANCREATOGRAPHY (ERCP);  Surgeon: Lucilla Lame,  MD;  Location: [facility]COPY;  Service: Endoscopy;  Laterality: N/A;   ESOPHAGOGASTRODUODENOSCOPY  07-2006   with dilation   KIDNEY SURGERY  1970   lumpectomy  [phone]   benign right breast   SQUAMOUS CELL CARCINOMA EXCISION  6/12   Right leg--Dr Pat Patrick   SQUAMOUS CELL CARCINOMA EXCISION  2012   right calf   TOTAL ABDOMINAL HYSTERECTOMY W/ BILATERAL SALPINGOOPHORECTOMY  1973    Home Medications:  Allergies as of 11/21/2021       Reactions   Penicillins    Has patient had a PCN reaction causing immediate rash, facial/tongue/throat swelling, SOB or lightheadedness with hypotension: Unknown Has patient had a PCN reaction causing severe rash involving mucus membranes or skin necrosis: Unknown Has patient had a PCN reaction that required hospitalization: Unknown Has patient had a PCN reaction occurring within the last 10 years: Unknown If all of the above answers are "NO", then may proceed with Cephalosporin use.   Rabeprazole Sodium    REACTION: constipated   Sulfonamide Derivatives    REACTION: unspecified   Cefuroxime    diarrhea        Medication List        Accurate as of November 21, 2021  3:12 PM. If you have any questions, ask your nurse or doctor.          STOP taking these medications  nitrofurantoin (macrocrystal-monohydrate) 100 MG capsule Commonly known as: MACROBID Stopped by: [name], MD       TAKE these medications    celecoxib 200 MG capsule Commonly known as: CELEBREX Take 1 capsule (200 mg total) by mouth daily.   estradiol 0.1 MG/GM vaginal cream Commonly known as: ESTRACE APPLY PEA SIZED AMOUNT VAGINALLY 2 TIMES A WEEK   FISH OIL PO Take 1 capsule by mouth daily.   metoprolol tartrate 25 MG tablet Commonly known as: LOPRESSOR Take 1 tablet (25 mg total) by mouth 2 (two) times daily.   NON FORMULARY Immunity support gummies Takes 1 gummy daily.   NON FORMULARY Black elderberry gummy Takes 1 gummy qd.   omeprazole 20 MG  capsule Commonly known as: PRILOSEC TAKE 1 CAPSULE(20 MG) BY MOUTH DAILY   vitamin C 500 MG tablet Commonly known as: ASCORBIC ACID Take 500 mg by mouth daily.   Vitamin D-3 25 MCG (1000 UT) Caps Take 1 capsule by mouth daily.        Allergies:  Allergies  Allergen Reactions   Penicillins     Has patient had a PCN reaction causing immediate rash, facial/tongue/throat swelling, SOB or lightheadedness with hypotension: Unknown Has patient had a PCN reaction causing severe rash involving mucus membranes or skin necrosis: Unknown Has patient had a PCN reaction that required hospitalization: Unknown Has patient had a PCN reaction occurring within the last 10 years: Unknown If all of the above answers are "NO", then may proceed with Cephalosporin use.   Rabeprazole Sodium     REACTION: constipated   Sulfonamide Derivatives     REACTION: unspecified   Cefuroxime     diarrhea    Family History: Family History  Problem Relation Age of Onset   Heart attack Father    Stroke Mother    Melanoma Daughter     Social History:  reports that she has quit smoking. She has never used smokeless tobacco. She reports that she does not currently use alcohol after a past usage of about 1.0 standard drink per week. She reports that she does not use drugs.   Physical Exam: BP (!) 142/83    Pulse 69    Ht 5\' 4"  (1.626 m)    Wt 152 lb (68.9 kg)    BMI 26.09 kg/m   Constitutional:  Alert and oriented, No acute distress. HEENT: [place] AT, moist mucus membranes.  Trachea midline, no masses. Cardiovascular: No clubbing, cyanosis, or edema. Respiratory: Normal respiratory effort, no increased work of breathing. Skin: No rashes, bruises or suspicious lesions. Neurologic: Grossly intact, no focal deficits, moving all 4 extremities. Psychiatric: Normal mood and affect.  Laboratory Data:  Lab Results  Component Value Date   CREATININE 0.73 05/17/2021   Urinalysis 6-10 WBCs and calcium oxalate  crystals   Assessment & Plan:    Gross Hematuria  - We discussed the differential diagnosis for microscopic hematuria including nephrolithiasis, renal or upper tract tumors, bladder stones, UTIs, or bladder tumors as well as undetermined etiologies. - Per AUA guidelines, I did recommend complete microscopic hematuria evaluation including CTU, possible urine cytology, and office cystoscopy.She is agreeable with this plan.   2. Kidney stones Seen on previous scan, nonobstructing May be cause of #1 although not consistent with previous stone episodes Eval with CT scan as above  3. History of smoking Risk factor of bladder cancer   Return for cystoscopy / CT Urogram  I,Kailey Littlejohn,acting as a scribe for [name], MD.,have documented all  relevant documentation on the behalf of [name], MD,as directed by  [name], MD while in the presence of [name], MD.  I have reviewed the above documentation for accuracy and completeness, and I agree with the above.   [name], MD   [facility] [address], [place], [place] [address] [phone]

## 2021-11-21 ENCOUNTER — Other Ambulatory Visit: Payer: Self-pay

## 2021-11-21 ENCOUNTER — Encounter: Payer: Self-pay | Admitting: Urology

## 2021-11-21 ENCOUNTER — Ambulatory Visit (INDEPENDENT_AMBULATORY_CARE_PROVIDER_SITE_OTHER): Payer: Medicare Other | Admitting: Urology

## 2021-11-21 VITALS — BP 142/83 | HR 69 | Ht 64.0 in | Wt 152.0 lb

## 2021-11-21 DIAGNOSIS — N200 Calculus of kidney: Secondary | ICD-10-CM | POA: Diagnosis not present

## 2021-11-21 DIAGNOSIS — R310 Gross hematuria: Secondary | ICD-10-CM | POA: Diagnosis not present

## 2021-11-21 DIAGNOSIS — Z87891 Personal history of nicotine dependence: Secondary | ICD-10-CM | POA: Diagnosis not present

## 2021-11-21 LAB — URINALYSIS, COMPLETE
Bilirubin, UA: NEGATIVE
Glucose, UA: NEGATIVE
Nitrite, UA: NEGATIVE
Protein,UA: NEGATIVE
Specific Gravity, UA: 1.025 (ref 1.005–1.030)
Urobilinogen, Ur: 1 mg/dL (ref 0.2–1.0)
pH, UA: 6 (ref 5.0–7.5)

## 2021-11-21 LAB — MICROSCOPIC EXAMINATION: RBC, Urine: NONE SEEN /hpf (ref 0–2)

## 2021-11-21 NOTE — Patient Instructions (Signed)

## 2021-12-11 ENCOUNTER — Other Ambulatory Visit: Payer: Self-pay

## 2021-12-11 ENCOUNTER — Ambulatory Visit
Admission: RE | Admit: 2021-12-11 | Discharge: 2021-12-11 | Disposition: A | Discharge status: Home / Self Care | Payer: Medicare Other | Source: Ambulatory Visit | Attending: Urology | Admitting: Urology

## 2021-12-11 DIAGNOSIS — N200 Calculus of kidney: Secondary | ICD-10-CM | POA: Insufficient documentation

## 2021-12-11 DIAGNOSIS — Z87891 Personal history of nicotine dependence: Secondary | ICD-10-CM | POA: Diagnosis present

## 2021-12-11 DIAGNOSIS — R310 Gross hematuria: Secondary | ICD-10-CM | POA: Diagnosis present

## 2021-12-11 LAB — POCT I-STAT CREATININE: Creatinine, Ser: 0.9 mg/dL (ref 0.44–1.00)

## 2021-12-11 MED ORDER — IOHEXOL 350 MG/ML SOLN
100.0000 mL | Freq: Once | INTRAVENOUS | Status: AC | PRN
  Administered 2021-12-11: 100 mL via INTRAVENOUS

## 2021-12-11 NOTE — Progress Notes (Signed)
° °  12/12/21  CC:  Chief Complaint  Patient presents with   Cysto     HPI: Dominique Hall is a 87 y.o.female with a personal history of gross hematuria, kidney stones, and smoking, who presents today for diagnostic cystoscopy.   CT renal stone study to evaluate flank pain on 05/17/2021 visualized bilateral nonobstructing renal calculi, measuring up to 7 mm in the left lower pole. No ureteral or bladder calculi. No hydronephrosis.   CT hematuria on 12/11/2021 several nonobstructing left renal calculi are again noted, similar to previous studies. No evidence of ureteral calculus or hydronephrosis. No evidence of renal mass or focal urothelial lesion. Bilateral renal cysts and a small cystocele are noted.   She is a former smoker 3/4 a pack daily.   She continues to have no flank pain.  She reports that her gross hematuria resolved about a week ago.  Her urinalysis today is negative for blood.   Vitals:   12/12/21 1508  BP: (!) 148/83  Pulse: 86  NED. A&Ox3.   No respiratory distress   Abd soft, NT, ND Normal external genitalia with patent urethral meatus  Cystoscopy Procedure Note  Patient identification was confirmed, informed consent was obtained, and patient was prepped using Betadine solution.  Lidocaine jelly was administered per urethral meatus.    Procedure: - Flexible cystoscope introduced, without any difficulty.   - Thorough search of the bladder revealed:    normal urethral meatus    normal urothelium    no stones    no ulcers     no tumors    no urethral polyps    no trabeculation  - Ureteral orifices were normal in position and appearance.  Post-Procedure: - Patient tolerated the procedure well  Assessment/ Plan:  Gross hematuria  - No microscopic blood in urine today  - Cystoscopy revealed NED - will monitor with UA in 3 months   2. Kidney stones - Seen on previous scan, nonobstructing and stable - May be cause of #1 although not consistent  with previous stone episodes   Return for UA in 3 months or sooner if she develops recurrent gross hematuria  I,Kailey Littlejohn,acting as a scribe for [name], MD.,have documented all relevant documentation on the behalf of [name], MD,as directed by  [name], MD while in the presence of [name], MD.  I have reviewed the above documentation for accuracy and completeness, and I agree with the above.   [name], MD

## 2021-12-12 ENCOUNTER — Ambulatory Visit (INDEPENDENT_AMBULATORY_CARE_PROVIDER_SITE_OTHER): Payer: Medicare Other | Admitting: Urology

## 2021-12-12 ENCOUNTER — Encounter: Payer: Self-pay | Admitting: Urology

## 2021-12-12 VITALS — BP 148/83 | HR 86 | Ht 64.0 in | Wt 152.0 lb

## 2021-12-12 DIAGNOSIS — R310 Gross hematuria: Secondary | ICD-10-CM | POA: Diagnosis not present

## 2021-12-12 LAB — MICROSCOPIC EXAMINATION

## 2021-12-12 LAB — URINALYSIS, COMPLETE
Bilirubin, UA: NEGATIVE
Glucose, UA: NEGATIVE
Nitrite, UA: NEGATIVE
Protein,UA: NEGATIVE
Specific Gravity, UA: 1.02 (ref 1.005–1.030)
Urobilinogen, Ur: 4 mg/dL — ABNORMAL HIGH (ref 0.2–1.0)
pH, UA: 6 (ref 5.0–7.5)

## 2021-12-13 ENCOUNTER — Encounter: Payer: Self-pay | Admitting: Internal Medicine

## 2021-12-20 ENCOUNTER — Encounter: Payer: Self-pay | Admitting: Internal Medicine

## 2021-12-20 MED ORDER — CLOBETASOL PROPIONATE 0.05 % EX OINT: TOPICAL_OINTMENT | CUTANEOUS | 1 refills | Status: DC

## 2022-01-16 ENCOUNTER — Telehealth: Payer: Self-pay

## 2022-01-16 NOTE — Progress Notes (Signed)
? ? ?Chronic Care Management ?Pharmacy Assistant  ? ?Name: Dominique Hall  MRN: [id number] DOB: December 22, 1934 ? ?Reason for Encounter: CCM (Hypertension Disease State) ?  ?Recent office visits:  ?11/03/2021 - Dominique Simpler, MD - Patient presented for hematuria. Abnormal Labs: No provider notes for POCT Urinalysis. Start: nitrofurantoin, macrocrystal-monohydrate, (MACROBID) 100 MG capsule. ?09/26/2021 - Dominique Simpler, MD - Patient presented for gross hematuria. Abnormal Labs: No provider notes for POCT Urinalysis. Referral to Urology. Start: nitrofurantoin, macrocrystal-monohydrate, (MACROBID) 100 MG capsule. ?09/22/2021 - Immunizations given - Fluad Quad (high dose 65+) ?07/27/2021 - Dominique Simpler, MD - Patient presented for medicare wellness exam.  ? ?Recent consult visits:  ?12/12/2021 - Dominique Espy, MD - Urology - Patient presented for gross hematuria. Abnormal labs: No provider notes. Procedures: Diagnostic cystoscopy. Results; - No microscopic blood in urine today - Cystoscopy revealed NED -Kidney Stones - Seen on previous scan, nonobstructing and stable.- May be cause of #1 although not consistent with previous stone episodes. ?12/11/2021 - Radiology - CT Hematuria  ?11/21/2021 - Dominique Espy, MD - Urology - Patient presented for hematuria. Abnormal labs: no provider notes. Stop due to completed course: nitrofurantoin, macrocrystal-monohydrate, (MACROBID) 100 MG capsule. Return for Cystoscopy.  ?08/22/2021 - Dominique Hall. MD - Family Medicine - Patient presented for postural dizziness. No medication changes.  ?08/07/2021 - Dominique Ito, NP - Video Visit - Patient presented for Covid positive. Start: molnupiravir EUA (LAGEVRIO) 200 mg CAPS capsule. ? ?Hospital visits:  ?None in previous 6 months ? ?Medications: ?Outpatient Encounter Medications as of 01/16/2022  ?Medication Sig  ? celecoxib (CELEBREX) 200 MG capsule Take 1 capsule (200 mg total) by mouth daily.  ? Cholecalciferol (VITAMIN D-3) 25 MCG  (1000 UT) CAPS Take 1 capsule by mouth daily.  ? clobetasol ointment (TEMOVATE) 0.05 % APPLY TO VULVAR AREA DAILY AT BED X4 WEEKS, EVERY OTHER DAY X2 WEEKS TWICE WEEKLY X2 WEEKS THEN STOP  ? estradiol (ESTRACE) 0.1 MG/GM vaginal cream APPLY PEA SIZED AMOUNT VAGINALLY 2 TIMES A WEEK  ? metoprolol tartrate (LOPRESSOR) 25 MG tablet Take 1 tablet (25 mg total) by mouth 2 (two) times daily.  ? NON FORMULARY Immunity support gummies Takes 1 gummy daily.  ? NON FORMULARY Black elderberry gummy Takes 1 gummy qd.  ? Omega-3 Fatty Acids (FISH OIL PO) Take 1 capsule by mouth daily.  ? omeprazole (PRILOSEC) 20 MG capsule TAKE 1 CAPSULE(20 MG) BY MOUTH DAILY  ? vitamin C (ASCORBIC ACID) 500 MG tablet Take 500 mg by mouth daily.  ? ?No facility-administered encounter medications on file as of 01/16/2022.  ? ?Recent Office Vitals: ?BP Readings from Last 3 Encounters:  ?12/12/21 (!) 148/83  ?11/21/21 (!) 142/83  ?11/03/21 124/78  ? ?Pulse Readings from Last 3 Encounters:  ?12/12/21 86  ?11/21/21 69  ?11/03/21 66  ?  ?Wt Readings from Last 3 Encounters:  ?12/12/21 152 lb (68.9 kg)  ?11/21/21 152 lb (68.9 kg)  ?11/03/21 152 lb (68.9 kg)  ?  ?Kidney Function ?Lab Results  ?Component Value Date/Time  ? CREATININE 0.90 12/11/2021 01:55 PM  ? CREATININE 0.73 05/17/2021 05:17 PM  ? GFR 66.82 04/14/2021 11:11 AM  ? GFRNONAA >60 05/17/2021 05:17 PM  ? GFRAA >60 06/23/2018 11:35 AM  ? ?BMP Latest Ref Rng & Units 12/11/2021 05/17/2021 04/14/2021  ?Glucose 70 - 99 mg/dL - 110(H) 115(H)  ?BUN 8 - 23 mg/dL - 27(H) 29(H)  ?Creatinine 0.44 - 1.00 mg/dL 0.90 0.73 0.81  ?Sodium 135 - 145 mmol/L - 140 146(H)  ?  Potassium 3.5 - 5.1 mmol/L - 3.6 3.6  ?Chloride 98 - 111 mmol/L - 108 108  ?CO2 22 - 32 mmol/L - 26 27  ?Calcium 8.9 - 10.3 mg/dL - 9.3 9.5  ? ?Contacted patient on 01/17/2022 to discuss hypertension disease state ? ?Current antihypertensive regimen:  ?Metoprolol 25 mg - 1 tablet BID  ? Patient verbally confirms she is taking the above medications as  directed. Yes ? ?How often are you checking your Blood Pressure? Patient has not been taking her blood pressure at home. I asked patient to take her blood pressure daily. I advised her I would call back on 01/26/2022 for her log. Patient understood and agreed.  ? ?Wrist or arm cuff: Wrist Cuff ?Caffeine intake: Patient drinks 1/2 of a diet Dominique Hall soda a day ?Salt intake: Patient does not add salt; only pepper  ?Over the counter medications including pseudoephedrine or NSAIDs? No OTC medications ? ?What recent interventions/DTPs have been made by any provider to improve Blood Pressure control since last CPP Visit: No recent interventions.  ? ?Any recent hospitalizations or ED visits since last visit with CPP? No ? ?What diet changes have been made to improve Blood Pressure Control?  ?Patient does not watch what she eats.  ? ?What exercise is being done to improve your Blood Pressure Control?  ?Patient is not exercising due to the weather. Patient and her husband would like to start walking 1/4 of a mile around their house.  ? ?Patient would like for Dr. Silvio Hall to advise her and husband on whether or not they should receive the last Covid booster shot.  ? ?Adherence Review: ?Is the patient currently on ACE/ARB medication? No ?Does the patient have >5 day gap between last estimated fill dates? N/A ? ?Star Rating Drugs:  ?Medication:  Last Fill: Day Supply ?No star rating drugs noted ? ?Care Gaps: ?Annual wellness visit in last year? Yes 07/27/2021 ?Most Recent BP reading: 148/83 on 12/12/2021 ? ?Upcoming appointments: ?No appointments scheduled within the next 30 days. ? ?Dominique Hall, CPP notified ? ?Dominique Hall, RMA ?Clinical Pharmacy Assistant ?[phone] ? ? ? ? ? ? ?

## 2022-01-31 NOTE — Progress Notes (Signed)
Spoke with patient to obtain her blood pressure readings as previously discussed.  ?Patient was unsure of exact dates, but these are the past eight blood pressure readings she gave me that were from 01/17/2022 - 01/30/2022. Patient did confirm taking her blood pressure medications as directed.  ? ?136/84 ?121/76 ?134/83 ?134/75 ?149/104 ?130/88 ?132/80 ?110/61 ? ?Charlene Brooke, CPP notified ? ?Marijean Niemann, RMA ?Clinical Pharmacy Assistant ?[phone] ? ? ? ?

## 2022-02-06 ENCOUNTER — Telehealth: Payer: Self-pay | Admitting: Cardiovascular Disease

## 2022-02-06 NOTE — Telephone Encounter (Signed)
Patient reports that she's been having fainting spells in the morning for the last month and they usually go away by about 10 in the morning, but these have gotten better over the last several days.  ? ?Pt normally takes in metoprolol when she's eating breakfast. Reports that she normally gets up and makes the bed and does a few things, and that's when she'll feel faint, but then as she sits down she feels better. She has NOT passed out. ? ?The provided BP readings are from the last 2 days and some this morning. The low reading (83/60) is from yesterday morning. The 168/100 reading is from this morning and was taken while her husband was on the phone with the office, she thinks she may have been scared.  ? ?Encouraged patient to drink fluids prior to getting out of bed in the morning and to make sure she's getting out of bed slowly and sitting on the edge of the bed for several minutes prior to standing up to see if that helps with the faint feeling.  ? ?Pt offered appt tomorrow morning as MD had cancellation. Pt accepted.  ? ?Pt given strict ER precautions. Pt verbalized understanding.  ? ? ? ? ? ? ?

## 2022-02-06 NOTE — Telephone Encounter (Signed)
?  Pt c/o BP issue: STAT if pt c/o blurred vision, one-sided weakness or slurred speech ? ?1. What are your last 5 BP readings?  ?132/76 ?83/60 116 ?117/75 92 ?157/98 61 ?164/93 59 ?168/100 63 ? ?2. Are you having any other symptoms (ex. Dizziness, headache, blurred vision, passed out)? no ? ?3. What is your BP issue? BP is increasing.  Please call to discuss.  ? ? ?

## 2022-02-07 ENCOUNTER — Ambulatory Visit (INDEPENDENT_AMBULATORY_CARE_PROVIDER_SITE_OTHER): Payer: Medicare Other

## 2022-02-07 ENCOUNTER — Other Ambulatory Visit: Payer: Self-pay

## 2022-02-07 ENCOUNTER — Ambulatory Visit (INDEPENDENT_AMBULATORY_CARE_PROVIDER_SITE_OTHER): Payer: Medicare Other | Admitting: Cardiovascular Disease

## 2022-02-07 ENCOUNTER — Encounter: Payer: Self-pay | Admitting: Cardiovascular Disease

## 2022-02-07 VITALS — BP 160/80 | HR 68 | Ht 64.0 in | Wt 151.0 lb

## 2022-02-07 DIAGNOSIS — R42 Dizziness and giddiness: Secondary | ICD-10-CM | POA: Diagnosis not present

## 2022-02-07 DIAGNOSIS — I471 Supraventricular tachycardia: Secondary | ICD-10-CM | POA: Diagnosis not present

## 2022-02-07 DIAGNOSIS — R079 Chest pain, unspecified: Secondary | ICD-10-CM | POA: Diagnosis not present

## 2022-02-07 NOTE — Progress Notes (Signed)
?  ?  ? ? ?Date:  02/07/2022  ? ?ID:  Dominique Hall, DOB [birth year], MRN [id number] ? ?Patient Location:  ?[place] ?[address] [place] [id number]  ? ?Provider location:   ?[place], [employer] office ? ?PCP:  Venia Carbon, MD  ?Cardiologist:  [facility] ? ?Chief Complaint  ?Patient presents with  ? Fluctuating blood pressure & dizzy   ?  Patient c/o feeling faint in the mornings with her blood pressure fluctuating. Medications reviewed by the patient verbally.   ? ? ?History of Present Illness:   ? ?Dominique Hall is a 87 y.o. female  ?past medical history of  ?chest pain.  ?palpitations  ?not diabetic and not a smoker. ?tachycardia in 2005 ,hospitalized at [facility] with negative work-up.   ?Who presents for routine follow-up of her chest pain episodes, lightheadedness ? ?LOV 9/22 ? ?In follow-up today she presents with her husband ?She reports having some lightheadedness/lifeless feeling when getting up in the morning ?Does not happen every day, typically happens when she gets out of bed after about 30 minutes she will have episodes, feels lifeless ?Takes her medications with breakfast, feels Better after eating ?Getting worse over the past 6 months ? ?Does not drink much fluids in the daytime ?Was told by nurse at [facility] that she needs to drink more fluids ?Denies tachypalpitations ? ?BPs at home: Recently measured down to 85 systolic with heart rate 272, ?Repeat blood pressure that day 536 systolic heart rate in the 90s ?Other pressure measurements that she has are typically in the 644 systolic range ? ?Currently not on HCTZ ?Denies chest pain concerning for angina ? ?EKG personally reviewed by myself on todays visit ?Shows normal sinus rhythm with rate 68 bpm no significant ST-T wave changes ? ?Other past medical history reviewed ?Prior echocardiogram and stress test ?No ischemia, normal ejection fraction ? ?Stress test 07/2019 ?Pharmacological myocardial perfusion imaging study  with no significant  ischemia ?Grossly Normal wall motion, Unable to estimate EF secondary to GI uptake artifact ?No EKG changes concerning for ischemia at peak stress or in recovery. ?Low risk scan ? ?Echo  07/2019 ? 1. Left ventricular ejection fraction, by visual estimation, is 60 to 65%. The left ventricle has normal function. Normal left ventricular size. There is no left ventricular hypertrophy. ? 2. Left ventricular diastolic Doppler parameters are consistent with impaired relaxation pattern of LV diastolic filling. ? 3. Global right ventricle has normal systolic function.The right ventricular size is normal. No increase in right ventricular wall thickness. ? 4. Mild to moderately elevated pulmonary artery systolic pressure 42 mm Hg. ? 5. Left atrial size was mildly dilated. ? 6. Right atrial size was normal. ? ? ?Past Medical History:  ?Diagnosis Date  ? Allergy   ? Arthritis   ? Chest pain   ? a. 07/2019 MV: No ischemia/infarct. Low risk; b. 04/2021 MV: No isch/scar. EF 60%. No significant cor Ca2+. Developed atrial tachycardia during study.  ? Diverticulosis   ? Esophageal dysmotility   ? Esophageal ring   ? GERD (gastroesophageal reflux disease)   ? Hiatal hernia   ? HTN (hypertension)   ? IBS (irritable bowel syndrome)   ? Osteopenia   ? PAH (pulmonary artery hypertension) ([place])   ? a. 07/2019 Echo: EF 60-65%, impaired relaxation. Nl RV size/fxn. PASP 62mHg. Mildly dil LA.  ? Palpitations   ? Pre-syncope   ? Renal disorder   ? Sleep disturbance   ? ?Past  Surgical History:  ?Procedure Laterality Date  ? ABDOMINAL HYSTERECTOMY    ? APPENDECTOMY  1973  ? BREAST EXCISIONAL BIOPSY Right   ? CHOLECYSTECTOMY    ? ERCP N/A 11/22/2017  ? Procedure: ENDOSCOPIC RETROGRADE CHOLANGIOPANCREATOGRAPHY (ERCP);  Surgeon: Lucilla Lame, MD;  Location: [facility] ENDOSCOPY;  Service: Endoscopy;  Laterality: N/A;  ? ESOPHAGOGASTRODUODENOSCOPY  07-2006  ? with dilation  ? KIDNEY SURGERY  1970  ? lumpectomy  [phone]  ? benign right breast   ? SQUAMOUS CELL CARCINOMA EXCISION  6/12  ? Right leg--Dr Pat Patrick  ? SQUAMOUS CELL CARCINOMA EXCISION  2012  ? right calf  ? TOTAL ABDOMINAL HYSTERECTOMY W/ BILATERAL SALPINGOOPHORECTOMY  1973  ?  ? ?Allergies:   Penicillins, Rabeprazole sodium, Sulfonamide derivatives, and Cefuroxime  ? ?Social History  ? ?Tobacco Use  ? Smoking status: Former  ? Smokeless tobacco: Never  ?Vaping Use  ? Vaping Use: Never used  ?Substance Use Topics  ? Alcohol use: Not Currently  ?  Alcohol/week: 1.0 standard drink  ?  Types: 1 Glasses of wine per week  ?  Comment: occ  ? Drug use: No  ?  ? ?Current Outpatient Medications on File Prior to Visit  ?Medication Sig Dispense Refill  ? celecoxib (CELEBREX) 200 MG capsule Take 1 capsule (200 mg total) by mouth daily. 90 capsule 3  ? Cholecalciferol (VITAMIN D-3) 25 MCG (1000 UT) CAPS Take 1 capsule by mouth daily.    ? clobetasol ointment (TEMOVATE) 0.05 % APPLY TO VULVAR AREA DAILY AT BED X4 WEEKS, EVERY OTHER DAY X2 WEEKS TWICE WEEKLY X2 WEEKS THEN STOP 30 g 1  ? estradiol (ESTRACE) 0.1 MG/GM vaginal cream APPLY PEA SIZED AMOUNT VAGINALLY 2 TIMES A WEEK 42.5 g 3  ? metoprolol tartrate (LOPRESSOR) 25 MG tablet Take 1 tablet (25 mg total) by mouth 2 (two) times daily. 180 tablet 3  ? NON FORMULARY Immunity support gummies Takes 1 gummy daily.    ? NON FORMULARY Black elderberry gummy Takes 1 gummy qd.    ? Omega-3 Fatty Acids (FISH OIL PO) Take 1 capsule by mouth daily.    ? omeprazole (PRILOSEC) 20 MG capsule TAKE 1 CAPSULE(20 MG) BY MOUTH DAILY 90 capsule 3  ? vitamin C (ASCORBIC ACID) 500 MG tablet Take 500 mg by mouth daily.    ? ?No current facility-administered medications on file prior to visit.  ? ?Family Hx: ?The patient's family history includes Heart attack in her father; Melanoma in her daughter; Stroke in her mother. ? ?ROS:   ?Please see the history of present illness.    ?Review of Systems  ?Constitutional: Negative.   ?HENT: Negative.    ?Respiratory: Negative.     ?Cardiovascular: Negative.   ?Gastrointestinal: Negative.   ?Musculoskeletal: Negative.   ?Neurological: Negative.   ?Psychiatric/Behavioral: Negative.    ?All other systems reviewed and are negative.  ? ?Labs/Other Tests and Data Reviewed:   ? ?Recent Labs: ?04/14/2021: ALT 11 ?05/17/2021: BUN 27; Hemoglobin 12.6; Platelets 154; Potassium 3.6; Sodium 140 ?12/11/2021: Creatinine, Ser 0.90  ? ?Recent Lipid Panel ?Lab Results  ?Component Value Date/Time  ? CHOL 152 02/22/2014 04:12 PM  ? TRIG 98.0 02/22/2014 04:12 PM  ? HDL 63.00 02/22/2014 04:12 PM  ? CHOLHDL 2 02/22/2014 04:12 PM  ? LDLCALC 69 02/22/2014 04:12 PM  ? ? ?Wt Readings from Last 3 Encounters:  ?02/07/22 151 lb (68.5 kg)  ?12/12/21 152 lb (68.9 kg)  ?11/21/21 152 lb (68.9 kg)  ?  ?Exam:   ? ?  Vital Signs: Vital signs may also be detailed in the HPI ?BP (!) 160/80 (BP Location: Left Arm, Patient Position: Sitting, Cuff Size: Normal)   Pulse 68   Ht '5\' 4"'$  (1.626 m)   Wt 151 lb (68.5 kg)   SpO2 95%   BMI 25.92 kg/m?   ? ?Constitutional:  oriented to person, place, and time. No distress.  ?HENT:  ?Head: Grossly normal ?Eyes:  no discharge. No scleral icterus.  ?Neck: No JVD, no carotid bruits  ?Cardiovascular: Regular rate and rhythm, no murmurs appreciated ?Pulmonary/Chest: Clear to auscultation bilaterally, no wheezes or rails ?Abdominal: Soft.  no distension.  no tenderness.  ?Musculoskeletal: Normal range of motion ?Neurological:  normal muscle tone. Coordination normal. No atrophy ?Skin: Skin warm and dry ?Psychiatric: normal affect, pleasant ? ?ASSESSMENT & PLAN:   ? ?Problem List Items Addressed This Visit   ? ?  ? Cardiology Problems  ? SVT (supraventricular tachycardia) ([place])  ?  ? Other  ? Postural dizziness  ? ?Other Visit Diagnoses   ? ? Lightheadedness    -  Primary  ? Relevant Orders  ? EKG 12-Lead  ? LONG TERM MONITOR (3-14 DAYS)  ? Chest pain of uncertain etiology      ? ?  ? ?Chest pain: ?Prior history atypical pain, denies having symptoms on  today's visit ?No further work-up needed at this time ? ?Shortness of breath ?We recommended regular walking program for conditioning ? ?Dizziness/lightheaded/" lifeless" ?Low blood pressures on her measurements at home,

## 2022-02-07 NOTE — Addendum Note (Signed)
Addended by: Minna Merritts on: 02/07/2022 12:49 PM ? ? Modules accepted: Level of Service ? ?

## 2022-02-07 NOTE — Patient Instructions (Addendum)
Medication Instructions:  ?No changes ? ?If you need a refill on your cardiac medications before your next appointment, please call your pharmacy.  ? ?Lab work: ?No new labs needed ? ?Testing/Procedures: ?Your provider has ordered a heart monitor to wear for 14 days. This will be mailed to your home with instructions on placement. Once you have finished the time frame requested, you will return monitor in box provided. ? ?  ? ?Follow-Up: ?At [facility], you and your health needs are our priority.  As part of our continuing mission to provide you with exceptional heart care, we have created designated Provider Care Teams.  These Care Teams include your primary Cardiologist (physician) and Advanced Practice Providers (APPs -  Physician Assistants and Nurse Practitioners) who all work together to provide you with the care you need, when you need it. ? ?You will need a follow up appointment in 6 months, APP ok ? ?Providers on your designated Care Team:   ?Murray Hodgkins, NP ?Christell Faith, PA-C ?Cadence Kathlen Mody, PA-C ? ?COVID-19 Vaccine Information can be found at: [email or web address].uk For questions related to vaccine distribution or appointments, please email [email or web address] or call [phone].  ? ?

## 2022-02-10 DIAGNOSIS — R42 Dizziness and giddiness: Secondary | ICD-10-CM | POA: Diagnosis not present

## 2022-02-13 ENCOUNTER — Other Ambulatory Visit: Payer: Self-pay | Admitting: Internal Medicine

## 2022-03-07 ENCOUNTER — Telehealth: Payer: Self-pay | Admitting: Emergency Medicine

## 2022-03-07 ENCOUNTER — Telehealth: Payer: Self-pay

## 2022-03-07 MED ORDER — METOPROLOL TARTRATE 50 MG PO TABS: 50.0000 mg | ORAL_TABLET | Freq: Two times a day (BID) | ORAL | 3 refills | Status: DC

## 2022-03-07 NOTE — Telephone Encounter (Signed)
-----   Message from Minna Merritts, MD sent at 03/06/2022  9:17 AM EDT ----- ?Monitor reviewed ?Normal rhythm with frequent ectopy, short bursts of tachycardia ?Most of the triggered events seem to be associated with extra beats ?Given her spells in the morning, could try to increase metoprolol heart rate up to 50 twice daily to see if we can slow down these extra beats and fast rhythms.  ?No other arrhythmia such as atrial fibrillation runs of cardiac to explain symptoms of lightheadedness. ?Would try to get the metoprolol in early morning ?

## 2022-03-07 NOTE — Addendum Note (Signed)
Addended by: Mila Merry on: 03/07/2022 02:01 PM ? ? Modules accepted: Orders ? ?

## 2022-03-07 NOTE — Telephone Encounter (Signed)
Please see results phone encounter from 4/26. Closing this encounter.  ?

## 2022-03-07 NOTE — Telephone Encounter (Signed)
Called patient to go over results, no answer, lmtcb.  ?

## 2022-03-07 NOTE — Telephone Encounter (Signed)
Called patient, went over results and recommendations. Pt verbalized understanding, and questions, if any, were answered.  ?

## 2022-03-07 NOTE — Telephone Encounter (Signed)
Patient called into refills at [address] Dominique Hall because she said she wants to return call she missed early today from Dr. Donivan Scull office. Please call back at [phone].  ?

## 2022-03-10 ENCOUNTER — Other Ambulatory Visit: Payer: Self-pay | Admitting: Internal Medicine

## 2022-03-12 NOTE — Progress Notes (Signed)
? ?03/14/22 ?9:20 AM  ? ?[name] ?02-13-1935 ?[id number] ? ?Referring provider:  ?Venia Carbon, MD ?[place] ?[place],  [place] [address] ?Chief Complaint  ?Patient presents with  ? Hematuria  ? ? ? ?HPI: ?Dominique Hall is a 87 y.o.female with a personal history of gross hematuria, kidney stones, and smoking, who presents today for a 3 month follow-up with UA.  ? ?  ?CT renal stone study to evaluate flank pain on 05/17/2021 visualized bilateral nonobstructing renal calculi, measuring up to 7 mm in the left lower pole. No ureteral or bladder calculi. No hydronephrosis.  ?  ?CT hematuria on 12/11/2021 several nonobstructing left renal calculi are again noted, similar to previous studies. No evidence of ureteral calculus or hydronephrosis. No evidence of renal mass or focal urothelial lesion. Bilateral renal cysts and a small cystocele are noted.  ? ?Cystoscopy on 12/12/2021 was unremarkable.  ?  ?She is a former smoker 3/4 a pack daily.  ? ?She reports that she has burning during urination with urgency and frequency. This started about a week ago. She denies blood in urine.  She feels like she has a mild UTI.  No fevers or chills. ? ?UA today shows >30 WBCs, and many bacteria  ? ?PMH: ?Past Medical History:  ?Diagnosis Date  ? Allergy   ? Arthritis   ? Chest pain   ? a. 07/2019 MV: No ischemia/infarct. Low risk; b. 04/2021 MV: No isch/scar. EF 60%. No significant cor Ca2+. Developed atrial tachycardia during study.  ? Diverticulosis   ? Esophageal dysmotility   ? Esophageal ring   ? GERD (gastroesophageal reflux disease)   ? Hiatal hernia   ? HTN (hypertension)   ? IBS (irritable bowel syndrome)   ? Osteopenia   ? PAH (pulmonary artery hypertension) ([place])   ? a. 07/2019 Echo: EF 60-65%, impaired relaxation. Nl RV size/fxn. PASP 5mHg. Mildly dil LA.  ? Palpitations   ? Pre-syncope   ? Renal disorder   ? Sleep disturbance   ? ? ?Surgical History: ?Past Surgical History:  ?Procedure  Laterality Date  ? ABDOMINAL HYSTERECTOMY    ? APPENDECTOMY  1973  ? BREAST EXCISIONAL BIOPSY Right   ? CHOLECYSTECTOMY    ? ERCP N/A 11/22/2017  ? Procedure: ENDOSCOPIC RETROGRADE CHOLANGIOPANCREATOGRAPHY (ERCP);  Surgeon: WLucilla Lame MD;  [facility];  Service: Endoscopy;  Laterality: N/A;  ? ESOPHAGOGASTRODUODENOSCOPY  07-2006  ? with dilation  ? KIDNEY SURGERY  1970  ? lumpectomy  [phone] ? benign right breast  ? SQUAMOUS CELL CARCINOMA EXCISION  6/12  ? Right leg--Dr EPat Patrick ? SQUAMOUS CELL CARCINOMA EXCISION  2012  ? right calf  ? TOTAL ABDOMINAL HYSTERECTOMY W/ BILATERAL SALPINGOOPHORECTOMY  1973  ? ? ?Home Medications:  ?Allergies as of 03/13/2022   ? ?   Reactions  ? Penicillins   ? Has patient had a PCN reaction causing immediate rash, facial/tongue/throat swelling, SOB or lightheadedness with hypotension: Unknown ?Has patient had a PCN reaction causing severe rash involving mucus membranes or skin necrosis: Unknown ?Has patient had a PCN reaction that required hospitalization: Unknown ?Has patient had a PCN reaction occurring within the last 10 years: Unknown ?If all of the above answers are "NO", then may proceed with Cephalosporin use.  ? Rabeprazole Sodium   ? REACTION: constipated  ? Sulfonamide Derivatives   ? REACTION: unspecified  ? Cefuroxime   ? diarrhea  ? ?  ? ?  ?Medication List  ?  ? ?  ?  Accurate as of Mar 13, 2022 11:59 PM. If you have any questions, ask your nurse or doctor.  ?  ?  ? ?  ? ?STOP taking these medications   ? ?clobetasol ointment 0.05 % ?Commonly known as: TEMOVATE ?Stopped by: Dominique Espy, MD ?  ?FISH OIL PO ?Stopped by: Dominique Espy, MD ?  ? ?  ? ?TAKE these medications   ? ?celecoxib 200 MG capsule ?Commonly known as: CELEBREX ?TAKE 1 CAPSULE BY MOUTH EVERY DAY ?  ?estradiol 0.1 MG/GM vaginal cream ?Commonly known as: ESTRACE ?APPLY PEA SIZED AMOUNT VAGINALLY 2 TIMES A WEEK ?  ?metoprolol tartrate 50 MG tablet ?Commonly known as: LOPRESSOR ?Take 1 tablet (50 mg  total) by mouth 2 (two) times daily. ?  ?nitrofurantoin (macrocrystal-monohydrate) 100 MG capsule ?Commonly known as: MACROBID ?Take 1 capsule (100 mg total) by mouth every 12 (twelve) hours for 10 days. ?Started by: Dominique Espy, MD ?  ?NON FORMULARY ?Immunity support gummies Takes 1 gummy daily. ?  ?NON FORMULARY ?Black elderberry gummy Takes 1 gummy qd. ?  ?omeprazole 20 MG capsule ?Commonly known as: PRILOSEC ?TAKE 1 CAPSULE(20 MG) BY MOUTH DAILY ?  ?vitamin C 500 MG tablet ?Commonly known as: ASCORBIC ACID ?Take 500 mg by mouth daily. ?  ?Vitamin D-3 25 MCG (1000 UT) Caps ?Take 1 capsule by mouth daily. ?  ? ?  ? ? ?Allergies:  ?Allergies  ?Allergen Reactions  ? Penicillins   ?  Has patient had a PCN reaction causing immediate rash, facial/tongue/throat swelling, SOB or lightheadedness with hypotension: Unknown ?Has patient had a PCN reaction causing severe rash involving mucus membranes or skin necrosis: Unknown ?Has patient had a PCN reaction that required hospitalization: Unknown ?Has patient had a PCN reaction occurring within the last 10 years: Unknown ?If all of the above answers are "NO", then may proceed with Cephalosporin use.  ? Rabeprazole Sodium   ?  REACTION: constipated  ? Sulfonamide Derivatives   ?  REACTION: unspecified  ? Cefuroxime   ?  diarrhea  ? ? ?Family History: ?Family History  ?Problem Relation Age of Onset  ? Heart attack Father   ? Stroke Mother   ? Melanoma Daughter   ? ? ?Social History:  reports that she has quit smoking. She has never used smokeless tobacco. She reports that she does not currently use alcohol after a past usage of about 1.0 standard drink per week. She reports that she does not use drugs. ? ? ?Physical Exam: ?BP (!) 154/81   Pulse 70   Ht '5\' 4"'$  (1.626 m)   Wt 150 lb (68 kg)   BMI 25.75 kg/m?   ?Constitutional:  Alert and oriented, No acute distress. ?HEENT: [place] AT, moist mucus membranes.  Trachea midline, no masses. ?Cardiovascular: No clubbing, cyanosis, or  edema. ?Respiratory: Normal respiratory effort, no increased work of breathing. ?Skin: No rashes, bruises or suspicious lesions. ?Neurologic: Grossly intact, no focal deficits, moving all 4 extremities. ?Psychiatric: Normal mood and affect. ? ?Laboratory Data: ? ?Lab Results  ?Component Value Date  ? CREATININE 0.90 12/11/2021  ? ? ?Urinalysis ?>30 WBCs and many bacteria  ? ? ?Assessment & Plan:   ?History of gross hematuria  ?- Resolved  ?- UA today shows no hematuria  ?- Discussed that if she experiences gross hematuria to follow-up sooner.  ? ?2. Acute cystitis  ?- Symptomatic for the past week  ?-Treat with Macrobid twice daily for 10 days, adjust as needed ?- UA suspicious today  ?-  Will send urine for culture  ? ?3. Kidney stones  ?- Asymptomatic and stable  ?- will monitor with KUB and UA in 1 year   ? ?F/u 1 year with KUB/ UA ? ?Conley Rolls as a Education administrator for Dominique Espy, MD.,have documented all relevant documentation on the behalf of Dominique Espy, MD,as directed by  Dominique Espy, MD while in the presence of Dominique Espy, MD. ? ?I have reviewed the above documentation for accuracy and completeness, and I agree with the above.  ? ?Dominique Espy, MD ? ? ?[place] ?[address] ?[place], [place] [address] ?[phone] ?

## 2022-03-13 ENCOUNTER — Encounter: Payer: Self-pay | Admitting: Urology

## 2022-03-13 ENCOUNTER — Encounter: Payer: Self-pay | Admitting: Internal Medicine

## 2022-03-13 ENCOUNTER — Ambulatory Visit (INDEPENDENT_AMBULATORY_CARE_PROVIDER_SITE_OTHER): Payer: Medicare Other | Admitting: Urology

## 2022-03-13 VITALS — BP 154/81 | HR 70 | Ht 64.0 in | Wt 150.0 lb

## 2022-03-13 DIAGNOSIS — N3000 Acute cystitis without hematuria: Secondary | ICD-10-CM | POA: Diagnosis not present

## 2022-03-13 DIAGNOSIS — N200 Calculus of kidney: Secondary | ICD-10-CM | POA: Diagnosis not present

## 2022-03-13 DIAGNOSIS — R310 Gross hematuria: Secondary | ICD-10-CM

## 2022-03-13 DIAGNOSIS — G4700 Insomnia, unspecified: Secondary | ICD-10-CM

## 2022-03-13 LAB — URINALYSIS, COMPLETE
Bilirubin, UA: NEGATIVE
Glucose, UA: NEGATIVE
Nitrite, UA: POSITIVE — AB
Specific Gravity, UA: >1.03 — ABNORMAL HIGH (ref 1.005–1.030)
Urobilinogen, Ur: 0.2 mg/dL (ref 0.2–1.0)
pH, UA: 6 (ref 5.0–7.5)

## 2022-03-13 LAB — MICROSCOPIC EXAMINATION: WBC, UA: >30 /hpf — ABNORMAL HIGH (ref 0–5)

## 2022-03-13 MED ORDER — NITROFURANTOIN MONOHYD MACRO 100 MG PO CAPS: 100.0000 mg | ORAL_CAPSULE | Freq: Two times a day (BID) | ORAL | 0 refills | Status: AC

## 2022-03-14 ENCOUNTER — Encounter: Payer: Self-pay | Admitting: Urology

## 2022-03-14 MED ORDER — CLORAZEPATE DIPOTASSIUM 7.5 MG PO TABS
ORAL_TABLET | ORAL | 0 refills | Status: DC
Start: 2022-03-14 — End: 2022-03-21

## 2022-03-15 ENCOUNTER — Telehealth: Payer: Self-pay

## 2022-03-15 NOTE — Telephone Encounter (Signed)
.  Fax received from pharmacy

## 2022-03-16 LAB — CULTURE, URINE COMPREHENSIVE

## 2022-03-16 NOTE — Telephone Encounter (Signed)
Completed PA on CoverMyMeds. Waiting for response.  ?

## 2022-03-19 ENCOUNTER — Ambulatory Visit (INDEPENDENT_AMBULATORY_CARE_PROVIDER_SITE_OTHER): Payer: Medicare Other | Admitting: Cardiovascular Disease

## 2022-03-19 ENCOUNTER — Encounter: Payer: Self-pay | Admitting: Cardiovascular Disease

## 2022-03-19 ENCOUNTER — Telehealth: Payer: Self-pay | Admitting: Cardiovascular Disease

## 2022-03-19 ENCOUNTER — Telehealth: Payer: Self-pay | Admitting: *Deleted

## 2022-03-19 VITALS — BP 110/64 | HR 80 | Ht 64.0 in | Wt 148.2 lb

## 2022-03-19 DIAGNOSIS — I10 Essential (primary) hypertension: Secondary | ICD-10-CM

## 2022-03-19 DIAGNOSIS — I471 Supraventricular tachycardia: Secondary | ICD-10-CM

## 2022-03-19 NOTE — Patient Instructions (Signed)
Medication Instructions:  ?Check blood pressure, ?Stay on metoprolol 50 twice a day ?Decrease the dose back to 25 twice a day for pressure <110 ? ?If you need a refill on your cardiac medications before your next appointment, please call your pharmacy.  ? ?Lab work: ?No new labs needed ? ?Testing/Procedures: ?No new testing needed ? ?Follow-Up: ?At [facility], you and your health needs are our priority.  As part of our continuing mission to provide you with exceptional heart care, we have created designated Provider Care Teams.  These Care Teams include your primary Cardiologist (physician) and Advanced Practice Providers (APPs -  Physician Assistants and Nurse Practitioners) who all work together to provide you with the care you need, when you need it. ? ?You will need a follow up appointment in 6 months, [place]m [place] or APP ok ? ?Providers on your designated Care Team:   ?Murray Hodgkins, NP ?Christell Faith, PA-C ?Cadence Kathlen Mody, PA-C ? ?COVID-19 Vaccine Information can be found at: [email or web address].uk For questions related to vaccine distribution or appointments, please email [email or web address] or call [phone].  ? ?

## 2022-03-19 NOTE — Telephone Encounter (Signed)
Pt c/o BP issue: STAT if pt c/o blurred vision, one-sided weakness or slurred speech ? ?1. What are your last 5 BP readings? currently 144/85 trends lower in morning 90/60's but 120/80's by afternoon to evening hours ? ?2. Are you having any other symptoms (ex. Dizziness, headache, blurred vision, passed out)? Lightheaded during  ? ?3. What is your BP issue? Patient reports bp numbers going up and down for some time now and wants to discuss with Dr. Rockey Situ.  Same day add on patient and spouse aware msg sent to provider nurse for review and will discuss above with provider at visit.  ? ?

## 2022-03-19 NOTE — Telephone Encounter (Signed)
Noted  

## 2022-03-19 NOTE — Telephone Encounter (Addendum)
Patient informed, she is improving. Her symptoms have improved-she wants to complete her antibiotic of what she has left, which is 5 days left. ? ? ?----- Message from [name], MD sent at 03/18/2022 12:00 PM EDT ----- ?Urine culture ended up only being intermediately sensitive to Macrobid.  If she still having symptoms, we can change the antibiotic.  If she is feeling better, no need to take additional antibiotics. ? ?Let me know. ? ?[name], MD ? ?

## 2022-03-19 NOTE — Progress Notes (Signed)
?  ?  ? ? ?Date:  03/19/2022  ? ?ID:  Dominique Hall, DOB [birth year], MRN [id number] ? ?Patient Location:  ?[place] ?Dominique [place] [id number]  ? ?Provider location:   ?[place], [employer] office ? ?PCP:  Venia Carbon, MD  ?Cardiologist:  [facility] ? ?Chief Complaint  ?Patient presents with  ? Blood pressure issues   ?  Patient c/o blood pressure fluctuating. Medications reviewed by the patient verbally.   ? ? ?History of Present Illness:   ? ?Dominique Hall is a 87 y.o. female  ?past medical history of  ?chest pain.  ?palpitations  ?not diabetic and not a smoker. ?tachycardia in 2005 ,hospitalized at [facility] with negative work-up.   ?Who presents for routine follow-up of her chest pain episodes, lightheadedness ? ?LOV 3/23 ?Zio Monitor reviewed with her on today's visit ?Normal rhythm with frequent ectopy, short bursts of tachycardia ?Most of the triggered events seem to be associated with extra beats ?We recommend she increase metoprolol to tartrate up to 50 twice daily ? ?Started higher dose metoprolol 50 BID this morning for paroxysmal tachycardia  ?Other issues reviewed ?Reports that she is fighting a UTI, on ABX, Klebsiella pneumoniae  ?Some recent low blood pressure ?Denies any near syncope or dizziness ? ?Currently not on HCTZ ? ?EKG personally reviewed by myself on todays visit ?Shows normal sinus rhythm with rate 80 bpm no significant ST-T wave changes with PVCs ? ?Other past medical history reviewed ?Prior echocardiogram and stress test ?No ischemia, normal ejection fraction ? ?Stress test 07/2019 ?Pharmacological myocardial perfusion imaging study with no significant  ischemia ?Grossly Normal wall motion, Unable to estimate EF secondary to GI uptake artifact ?No EKG changes concerning for ischemia at peak stress or in recovery. ?Low risk scan ? ?Echo  07/2019 ? 1. Left ventricular ejection fraction, by visual estimation, is 60 to 65%. The left ventricle has normal function.  Normal left ventricular size. There is no left ventricular hypertrophy. ? 2. Left ventricular diastolic Doppler parameters are consistent with impaired relaxation pattern of LV diastolic filling. ? 3. Global right ventricle has normal systolic function.The right ventricular size is normal. No increase in right ventricular wall thickness. ? 4. Mild to moderately elevated pulmonary artery systolic pressure 42 mm Hg. ? 5. Left atrial size was mildly dilated. ? 6. Right atrial size was normal. ? ? ?Past Medical History:  ?Diagnosis Date  ? Allergy   ? Arthritis   ? Chest pain   ? a. 07/2019 MV: No ischemia/infarct. Low risk; b. 04/2021 MV: No isch/scar. EF 60%. No significant cor Ca2+. Developed atrial tachycardia during study.  ? Diverticulosis   ? Esophageal dysmotility   ? Esophageal ring   ? GERD (gastroesophageal reflux disease)   ? Hiatal hernia   ? HTN (hypertension)   ? IBS (irritable bowel syndrome)   ? Osteopenia   ? PAH (pulmonary artery hypertension) ([place])   ? a. 07/2019 Echo: EF 60-65%, impaired relaxation. Nl RV size/fxn. PASP 18mHg. Mildly dil LA.  ? Palpitations   ? Pre-syncope   ? Renal disorder   ? Sleep disturbance   ? ?Past Surgical History:  ?Procedure Laterality Date  ? ABDOMINAL HYSTERECTOMY    ? APPENDECTOMY  1973  ? BREAST EXCISIONAL BIOPSY Right   ? CHOLECYSTECTOMY    ? ERCP N/A 11/22/2017  ? Procedure: ENDOSCOPIC RETROGRADE CHOLANGIOPANCREATOGRAPHY (ERCP);  Surgeon: WLucilla Lame MD;  [facility];  Service: Endoscopy;  Laterality: N/A;  ?  ESOPHAGOGASTRODUODENOSCOPY  07-2006  ? with dilation  ? KIDNEY SURGERY  1970  ? lumpectomy  [phone]  ? benign right breast  ? SQUAMOUS CELL CARCINOMA EXCISION  6/12  ? Right leg--Dr Pat Patrick  ? SQUAMOUS CELL CARCINOMA EXCISION  2012  ? right calf  ? TOTAL ABDOMINAL HYSTERECTOMY W/ BILATERAL SALPINGOOPHORECTOMY  1973  ?  ? ?Allergies:   Penicillins, Rabeprazole sodium, Sulfonamide derivatives, and Cefuroxime  ? ?Social History  ? ?Tobacco Use  ? Smoking  status: Former  ? Smokeless tobacco: Never  ?Vaping Use  ? Vaping Use: Never used  ?Substance Use Topics  ? Alcohol use: Not Currently  ?  Alcohol/week: 1.0 standard drink  ?  Types: 1 Glasses of wine per week  ?  Comment: occ  ? Drug use: No  ?  ? ?Current Outpatient Medications on File Prior to Visit  ?Medication Sig Dispense Refill  ? celecoxib (CELEBREX) 200 MG capsule TAKE 1 CAPSULE BY MOUTH EVERY DAY 90 capsule 3  ? Cholecalciferol (VITAMIN D-3) 25 MCG (1000 UT) CAPS Take 1 capsule by mouth daily.    ? estradiol (ESTRACE) 0.1 MG/GM vaginal cream APPLY PEA SIZED AMOUNT VAGINALLY 2 TIMES A WEEK 42.5 g 3  ? metoprolol tartrate (LOPRESSOR) 50 MG tablet Take 1 tablet (50 mg total) by mouth 2 (two) times daily. 180 tablet 3  ? nitrofurantoin, macrocrystal-monohydrate, (MACROBID) 100 MG capsule Take 1 capsule (100 mg total) by mouth every 12 (twelve) hours for 10 days. 20 capsule 0  ? NON FORMULARY Immunity support gummies Takes 1 gummy daily.    ? NON FORMULARY Black elderberry gummy Takes 1 gummy qd.    ? omeprazole (PRILOSEC) 20 MG capsule TAKE 1 CAPSULE(20 MG) BY MOUTH DAILY 90 capsule 3  ? vitamin C (ASCORBIC ACID) 500 MG tablet Take 500 mg by mouth daily.    ? clorazepate (TRANXENE) 7.5 MG tablet TAKE 1 TO 2 TABLETS BY MOUTH AT BEDTIME AS NEEDED. DX CODE G47.00 (Patient not taking: Reported on 03/19/2022) 60 tablet 0  ? ?No current facility-administered medications on file prior to visit.  ? ?Family Hx: ?The patient's family history includes Heart attack in her father; Melanoma in her daughter; Stroke in her mother. ? ?ROS:   ?Please see the history of present illness.    ?Review of Systems  ?Constitutional: Negative.   ?HENT: Negative.    ?Respiratory: Negative.    ?Cardiovascular: Negative.   ?Gastrointestinal: Negative.   ?Musculoskeletal: Negative.   ?Neurological: Negative.   ?Psychiatric/Behavioral: Negative.    ?All other systems reviewed and are negative.  ? ?Labs/Other Tests and Data Reviewed:    ? ?Recent Labs: ?04/14/2021: ALT 11 ?05/17/2021: BUN 27; Hemoglobin 12.6; Platelets 154; Potassium 3.6; Sodium 140 ?12/11/2021: Creatinine, Ser 0.90  ? ?Recent Lipid Panel ?Lab Results  ?Component Value Date/Time  ? CHOL 152 02/22/2014 04:12 PM  ? TRIG 98.0 02/22/2014 04:12 PM  ? HDL 63.00 02/22/2014 04:12 PM  ? CHOLHDL 2 02/22/2014 04:12 PM  ? LDLCALC 69 02/22/2014 04:12 PM  ? ? ?Wt Readings from Last 3 Encounters:  ?03/19/22 148 lb 4 oz (67.2 kg)  ?03/13/22 150 lb (68 kg)  ?02/07/22 151 lb (68.5 kg)  ?  ?Exam:   ? ?Vital Signs: Vital signs may also be detailed in the HPI ?BP 110/64 (BP Location: Left Arm, Patient Position: Sitting, Cuff Size: Normal)   Pulse 80   Ht '5\' 4"'$  (1.626 m)   Wt 148 lb 4 oz (67.2 kg)   SpO2  96%   BMI 25.45 kg/m?   ?Constitutional:  oriented to person, place, and time. No distress.  ?HENT:  ?Head: Grossly normal ?Eyes:  no discharge. No scleral icterus.  ?Neck: No JVD, no carotid bruits  ?Cardiovascular: Regular rate and rhythm, no murmurs appreciated ?Pulmonary/Chest: Clear to auscultation bilaterally, no wheezes or rails ?Abdominal: Soft.  no distension.  no tenderness.  ?Musculoskeletal: Normal range of motion ?Neurological:  normal muscle tone. Coordination normal. No atrophy ?Skin: Skin warm and dry ?Psychiatric: normal affect, pleasant ? ?ASSESSMENT & PLAN:   ? ?Problem List Items Addressed This Visit   ? ?  ? Cardiology Problems  ? Essential hypertension, benign  ? Relevant Orders  ? EKG 12-Lead  ? SVT (supraventricular tachycardia) ([place]) - Primary  ? Relevant Orders  ? EKG 12-Lead  ?Chest pain: ?Currently with no symptoms of angina. No further workup at this time. Continue current medication regimen. ? ?Shortness of breath ?Recommend walking program ? ?Essential hypertension ?Continue metoprolol as below ?For low blood pressure, could be secondary to UTI ?Has Klebsiella pneumonia bacterial urinary tract which she is having antibiotics currently ? ?Paroxysmal tachycardia ?Recommend  she stay on metoprolol tartrate 50 twice daily ?Frequent episodes short narrow complex tachycardia on Zio monitor ?For low blood pressure may need to go back on the Toprol tartrate 25 twice daily ? ? Total encounter t

## 2022-03-20 ENCOUNTER — Other Ambulatory Visit: Payer: Self-pay | Admitting: Internal Medicine

## 2022-03-20 DIAGNOSIS — G4700 Insomnia, unspecified: Secondary | ICD-10-CM

## 2022-03-21 MED ORDER — CLORAZEPATE DIPOTASSIUM 7.5 MG PO TABS: ORAL_TABLET | ORAL | 0 refills | Status: DC

## 2022-03-22 NOTE — Telephone Encounter (Signed)
Medication approved to 03-17-23 unless pt changes plans. ?

## 2022-05-05 ENCOUNTER — Other Ambulatory Visit: Payer: Self-pay | Admitting: Internal Medicine

## 2022-05-05 DIAGNOSIS — G4700 Insomnia, unspecified: Secondary | ICD-10-CM

## 2022-05-07 ENCOUNTER — Other Ambulatory Visit: Payer: Self-pay | Admitting: Internal Medicine

## 2022-05-07 DIAGNOSIS — G4700 Insomnia, unspecified: Secondary | ICD-10-CM

## 2022-05-07 NOTE — Telephone Encounter (Signed)
Last filled 03-16-22 #60 Last OV 11-03-21 Next OV 07-30-22 [facility] [address] [employer]

## 2022-05-16 ENCOUNTER — Telehealth: Payer: Self-pay

## 2022-05-16 NOTE — Progress Notes (Signed)
Chronic Care Management Pharmacy Assistant   Name: Dominique Hall  MRN: [id number] DOB: 02/12/1935  Reason for Encounter: CCM (General Adherence)   Recent office visits:  03/13/22 [name], MD Message Insomnia Start: Clorazepate Dipotassium 7.5 mg Change: Clorazepate Dipotassium 1 - 2 tablets  11/03/2021 - [name], MD - Patient presented for hematuria. Abnormal Labs: No provider notes for POCT Urinalysis. Start: nitrofurantoin, macrocrystal-monohydrate, (MACROBID) 100 MG capsule. 09/26/2021 - [name], MD - Patient presented for gross hematuria. Abnormal Labs: No provider notes for POCT Urinalysis. Referral to Urology. Start: nitrofurantoin, macrocrystal-monohydrate, (MACROBID) 100 MG capsule. 09/22/2021 - Immunizations given - Fluad Quad (high dose 65+) 07/27/2021 - [name], MD - Patient presented for medicare wellness exam.    Recent consult visits:  03/19/22 Ida Rogue, MD (Cardiology) SVT Ordered: EKG Change: decrease Metoprolol back to 25 for pressure <110.  03/13/22 [name], MD (Urology) Gross Hematuria Abnormal Labs: "Urine culture ended up only being intermediately sensitive to Macrobid.  If she still having symptoms, we can change the antibiotic.  If she is feeling better, no need to take additional antibiotics." Start: MACROBID 100 MG capsule Stop (patient) TEMOVATE 0.05 %  Stop (patient) Omega 3 FU 1 year 02/07/2022 Ida Rogue, MD (Cardiology) Lightheadedness Ordered: EKG and Long term monitor Change: Metoprolol Tartrate 50 mg  12/12/2021 - [name], MD - Urology - Patient presented for gross hematuria. Abnormal labs: No provider notes. Procedures: Diagnostic cystoscopy. Results; - No microscopic blood in urine today - Cystoscopy revealed NED -Kidney Stones - Seen on previous scan, nonobstructing and stable.- May be cause of #1 although not consistent with previous stone episodes. 12/11/2021 - Radiology - CT Hematuria  11/21/2021  - [name], MD - Urology - Patient presented for hematuria. Abnormal labs: no provider notes. Stop due to completed course: nitrofurantoin, macrocrystal-monohydrate, (MACROBID) 100 MG capsule. Return for Cystoscopy.  08/22/2021 - [name]. MD - Family Medicine - Patient presented for postural dizziness. No medication changes.  08/07/2021 - [name], NP - Video Visit - Patient presented for Covid positive. Start: molnupiravir EUA (LAGEVRIO) 200 mg CAPS capsule.   Hospital visits:  None in previous 6 months  Medications: Outpatient Encounter Medications as of 05/16/2022  Medication Sig   celecoxib (CELEBREX) 200 MG capsule TAKE 1 CAPSULE BY MOUTH EVERY DAY   Cholecalciferol (VITAMIN D-3) 25 MCG (1000 UT) CAPS Take 1 capsule by mouth daily.   clorazepate (TRANXENE) 7.5 MG tablet TAKE 1 TO 2 TABLETS BY MOUTH AT BEDTIME AS NEEDED   estradiol (ESTRACE) 0.1 MG/GM vaginal cream APPLY PEA SIZED AMOUNT VAGINALLY 2 TIMES A WEEK   metoprolol tartrate (LOPRESSOR) 50 MG tablet Take 1 tablet (50 mg total) by mouth 2 (two) times daily.   NON FORMULARY Immunity support gummies Takes 1 gummy daily.   NON FORMULARY Black elderberry gummy Takes 1 gummy qd.   omeprazole (PRILOSEC) 20 MG capsule TAKE 1 CAPSULE(20 MG) BY MOUTH DAILY   vitamin C (ASCORBIC ACID) 500 MG tablet Take 500 mg by mouth daily.   No facility-administered encounter medications on file as of 05/16/2022.    Attempted contact with patient 3 times. Unsuccessful outreach. Will atttempt contact next month.  Star Medications: Medication Name/mg Last Fill Days Supply No star rating drugs  Since last visit with CPP, the following interventions have been made.  Start: Clorazepate Dipotassium 7.5 mg  Change: Clorazepate Dipotassium 1 - 2 tablets  Change: decrease Metoprolol back to 25 for pressure <110.   Care  Gaps: Annual wellness visit in last year? Yes 07/27/21 Most Recent BP reading: 110/64 on 03/19/22  Upcoming  appointments: PCP appointment on 07/30/22 for Physical CCM appointment on 09/26/2022  Charlene Brooke, CPP notified  Marijean Niemann, [place] Assistant [phone]

## 2022-05-25 ENCOUNTER — Other Ambulatory Visit: Payer: Self-pay | Admitting: Internal Medicine

## 2022-05-25 ENCOUNTER — Ambulatory Visit (INDEPENDENT_AMBULATORY_CARE_PROVIDER_SITE_OTHER): Payer: Medicare Other | Admitting: Family

## 2022-05-25 ENCOUNTER — Encounter: Payer: Self-pay | Admitting: Family

## 2022-05-25 VITALS — BP 98/50 | HR 68 | Temp 98.3°F | Resp 16 | Ht 64.0 in | Wt 147.4 lb

## 2022-05-25 DIAGNOSIS — R5383 Other fatigue: Secondary | ICD-10-CM | POA: Insufficient documentation

## 2022-05-25 DIAGNOSIS — K921 Melena: Secondary | ICD-10-CM | POA: Diagnosis not present

## 2022-05-25 DIAGNOSIS — N3001 Acute cystitis with hematuria: Secondary | ICD-10-CM | POA: Diagnosis not present

## 2022-05-25 DIAGNOSIS — Z1231 Encounter for screening mammogram for malignant neoplasm of breast: Secondary | ICD-10-CM

## 2022-05-25 DIAGNOSIS — R0609 Other forms of dyspnea: Secondary | ICD-10-CM | POA: Insufficient documentation

## 2022-05-25 DIAGNOSIS — R319 Hematuria, unspecified: Secondary | ICD-10-CM | POA: Insufficient documentation

## 2022-05-25 DIAGNOSIS — R310 Gross hematuria: Secondary | ICD-10-CM

## 2022-05-25 LAB — COMPREHENSIVE METABOLIC PANEL
ALT: 15 U/L (ref 0–35)
AST: 18 U/L (ref 0–37)
Albumin: 3.9 g/dL (ref 3.5–5.2)
Alkaline Phosphatase: 87 U/L (ref 39–117)
BUN: 30 mg/dL — ABNORMAL HIGH (ref 6–23)
CO2: 27 mEq/L (ref 19–32)
Calcium: 9.5 mg/dL (ref 8.4–10.5)
Chloride: 105 mEq/L (ref 96–112)
Creatinine, Ser: 0.9 mg/dL (ref 0.40–1.20)
GFR: 58.43 mL/min — ABNORMAL LOW (ref >60.00)
Glucose, Bld: 76 mg/dL (ref 70–99)
Potassium: 3.4 mEq/L — ABNORMAL LOW (ref 3.5–5.1)
Sodium: 139 mEq/L (ref 135–145)
Total Bilirubin: 2 mg/dL — ABNORMAL HIGH (ref 0.2–1.2)
Total Protein: 6.3 g/dL (ref 6.0–8.3)

## 2022-05-25 LAB — POCT URINALYSIS DIP (CLINITEK)
Glucose, UA: NEGATIVE mg/dL
Nitrite, UA: NEGATIVE
POC PROTEIN,UA: 100 — AB
Spec Grav, UA: 1.02 (ref 1.010–1.025)
Urobilinogen, UA: 4 E.U./dL — AB
pH, UA: 6 (ref 5.0–8.0)

## 2022-05-25 LAB — CBC WITH DIFFERENTIAL/PLATELET
Basophils Absolute: 0 10*3/uL (ref 0.0–0.1)
Basophils Relative: 0.2 % (ref 0.0–3.0)
Eosinophils Absolute: 0.1 10*3/uL (ref 0.0–0.7)
Eosinophils Relative: 1.7 % (ref 0.0–5.0)
HCT: 35.9 % — ABNORMAL LOW (ref 36.0–46.0)
Hemoglobin: 12.2 g/dL (ref 12.0–15.0)
Lymphocytes Relative: 9 % — ABNORMAL LOW (ref 12.0–46.0)
Lymphs Abs: 0.6 10*3/uL — ABNORMAL LOW (ref 0.7–4.0)
MCHC: 33.9 g/dL (ref 30.0–36.0)
MCV: 87.3 fl (ref 78.0–100.0)
Monocytes Absolute: 0.8 10*3/uL (ref 0.1–1.0)
Monocytes Relative: 11.8 % (ref 3.0–12.0)
Neutro Abs: 5.1 10*3/uL (ref 1.4–7.7)
Neutrophils Relative %: 77.3 % — ABNORMAL HIGH (ref 43.0–77.0)
Platelets: 134 10*3/uL — ABNORMAL LOW (ref 150.0–400.0)
RBC: 4.11 Mil/uL (ref 3.87–5.11)
RDW: 13.5 % (ref 11.5–15.5)
WBC: 6.6 10*3/uL (ref 4.0–10.5)

## 2022-05-25 LAB — TSH: TSH: 0.67 u[IU]/mL (ref 0.35–5.50)

## 2022-05-25 LAB — BRAIN NATRIURETIC PEPTIDE: Pro B Natriuretic peptide (BNP): 146 pg/mL — ABNORMAL HIGH (ref 0.0–100.0)

## 2022-05-25 LAB — B12 AND FOLATE PANEL
Folate: >24.2 ng/mL (ref >5.9)
Vitamin B-12: 534 pg/mL (ref 211–911)

## 2022-05-25 MED ORDER — CIPROFLOXACIN HCL 250 MG PO TABS: 250.0000 mg | ORAL_TABLET | Freq: Two times a day (BID) | ORAL | 0 refills | Status: AC

## 2022-05-25 NOTE — Assessment & Plan Note (Signed)
New,  Pending cbc bnp  If any sudden worsening sob go to er and or call 911

## 2022-05-25 NOTE — Progress Notes (Signed)
Established Patient Office Visit  Subjective:  Patient ID: Dominique Hall, female    DOB: [birth year]  Age: 87 y.o. MRN: [id number]  CC:  Chief Complaint  Patient presents with   Fatigue    Slept all day yesterday    HPI Krishna Heuer is here today with concerns.   Over the last few weeks with fatigue, but in the last 3-4 days has been worsening significantly. She slept in the bed all day yesterday, she states yesterday she felt like she couldn't even move with significant weakness. Able to walk today just feeling weak.   With some slight urinary frequency , and some urinary cloudier than normal.   Did have a UTI back in may, was treated with macrobid.  No fever. No chills.  Did notice some dull blood in stool yesterday and today but not sure, maybe was in urine. No change in bowels. Does report mild DOE, but not at rest. No cough. No chest congestion.    Does take celebrex daily, with food.    Past Medical History:  Diagnosis Date   Allergy    Arthritis    Chest pain    a. 07/2019 MV: No ischemia/infarct. Low risk; b. 04/2021 MV: No isch/scar. EF 60%. No significant cor Ca2+. Developed atrial tachycardia during study.   Diverticulosis    Esophageal dysmotility    Esophageal ring    GERD (gastroesophageal reflux disease)    Hiatal hernia    HTN (hypertension)    IBS (irritable bowel syndrome)    Osteopenia    PAH (pulmonary artery hypertension) ([place])    a. 07/2019 Echo: EF 60-65%, impaired relaxation. Nl RV size/fxn. PASP 29mHg. Mildly dil LA.   Palpitations    Pre-syncope    Renal disorder    Sleep disturbance     Past Surgical History:  Procedure Laterality Date   ABDOMINAL HYSTERECTOMY     APPENDECTOMY  1973   BREAST EXCISIONAL BIOPSY Right    CHOLECYSTECTOMY     ERCP N/A 11/22/2017   Procedure: ENDOSCOPIC RETROGRADE CHOLANGIOPANCREATOGRAPHY (ERCP);  Surgeon: WLucilla Lame MD;  [facility];  Service: Endoscopy;  Laterality: N/A;    ESOPHAGOGASTRODUODENOSCOPY  07-2006   with dilation   KIDNEY SURGERY  1970   lumpectomy  [phone]  benign right breast   SQUAMOUS CELL CARCINOMA EXCISION  6/12   Right leg--Dr ECresenciano LickCELL CARCINOMA EXCISION  2012   right calf   TOTAL ABDOMINAL HYSTERECTOMY W/ BILATERAL SALPINGOOPHORECTOMY  1973    Family History  Problem Relation Age of Onset   Heart attack Father    Stroke Mother    Melanoma Daughter     Social History   Socioeconomic History   Marital status: Married    Spouse name: Not on file   Number of children: 2   Years of education: Not on file   Highest education level: Not on file  Occupational History   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[employer]  Tobacco Use   Smoking status: Former   Smokeless tobacco: Never  VScientific laboratory technicianUse: Never used  Substance and Sexual Activity   Alcohol use: Not Currently    Alcohol/week: 1.0 standard drink of alcohol    Types: 1 Glasses of wine per week    Comment: occ   Drug use: No   Sexual activity: Not on file  Other Topics Concern   Not on file  Social History Narrative   Has living will  Husband is  health care POA---alternate is daughters Lynne/Amanda   Would accept resuscitation but no prolonged ventilation   Probably would not want tube feedings if cognitively unaware   Social Determinants of Health   Financial Resource Strain: Low Risk  (07/25/2021)   Overall Financial Resource Strain (CARDIA)    Difficulty of Paying Living Expenses: Not very hard  Food Insecurity: Not on file  Transportation Needs: Not on file  Physical Activity: Not on file  Stress: Not on file  Social Connections: Not on file  Intimate Partner Violence: Not on file    Outpatient Medications Prior to Visit  Medication Sig Dispense Refill   celecoxib (CELEBREX) 200 MG capsule TAKE 1 CAPSULE BY MOUTH EVERY DAY 90 capsule 3   Cholecalciferol (VITAMIN D-3) 25 MCG (1000 UT) CAPS Take 1 capsule by mouth daily.     clorazepate (TRANXENE)  7.5 MG tablet TAKE 1 TO 2 TABLETS BY MOUTH AT BEDTIME AS NEEDED (Patient taking differently: as needed. TAKE 1 TO 2 TABLETS BY MOUTH AT BEDTIME AS NEEDED) 60 tablet 0   estradiol (ESTRACE) 0.1 MG/GM vaginal cream APPLY PEA SIZED AMOUNT VAGINALLY 2 TIMES A WEEK 42.5 g 3   metoprolol tartrate (LOPRESSOR) 50 MG tablet Take 1 tablet (50 mg total) by mouth 2 (two) times daily. 180 tablet 3   NON FORMULARY Immunity support gummies Takes 1 gummy daily.     NON FORMULARY Black elderberry gummy Takes 1 gummy qd.     omeprazole (PRILOSEC) 20 MG capsule TAKE 1 CAPSULE(20 MG) BY MOUTH DAILY 90 capsule 3   vitamin C (ASCORBIC ACID) 500 MG tablet Take 500 mg by mouth daily.     No facility-administered medications prior to visit.    Allergies  Allergen Reactions   Penicillins     Has patient had a PCN reaction causing immediate rash, facial/tongue/throat swelling, SOB or lightheadedness with hypotension: Unknown Has patient had a PCN reaction causing severe rash involving mucus membranes or skin necrosis: Unknown Has patient had a PCN reaction that required hospitalization: Unknown Has patient had a PCN reaction occurring within the last 10 years: Unknown If all of the above answers are "NO", then may proceed with Cephalosporin use.   Rabeprazole Sodium     REACTION: constipated   Sulfonamide Derivatives     REACTION: unspecified   Cefuroxime     diarrhea        Objective:    Physical Exam Constitutional:      General: She is not in acute distress.    Appearance: Normal appearance. She is well-developed, well-groomed and normal weight. She is not ill-appearing, toxic-appearing or diaphoretic.  HENT:     Mouth/Throat:     Pharynx: No pharyngeal swelling.     Tonsils: No tonsillar exudate.  Neck:     Thyroid: No thyroid mass.  Cardiovascular:     Rate and Rhythm: Normal rate and regular rhythm.  Pulmonary:     Effort: Pulmonary effort is normal.  Abdominal:     General: Abdomen is  flat.     Tenderness: There is no abdominal tenderness. There is no right CVA tenderness or left CVA tenderness.  Musculoskeletal:     Lumbar back: Normal. No tenderness.     Comments: No flank pain no CVA tenderness  Lymphadenopathy:     Cervical:     Right cervical: No superficial cervical adenopathy.    Left cervical: No superficial cervical adenopathy.  Neurological:     General: No focal deficit  present.     Mental Status: She is alert and oriented to person, place, and time. Mental status is at baseline.  Psychiatric:        Mood and Affect: Mood normal.        Behavior: Behavior normal.        Thought Content: Thought content normal.        Judgment: Judgment normal.     BP (!) 98/50   Pulse 68   Temp 98.3 F (36.8 C)   Resp 16   Ht '5\' 4"'$  (1.626 m)   Wt 147 lb 6 oz (66.8 kg)   SpO2 96%   BMI 25.30 kg/m  Wt Readings from Last 3 Encounters:  05/25/22 147 lb 6 oz (66.8 kg)  03/19/22 148 lb 4 oz (67.2 kg)  03/13/22 150 lb (68 kg)     Health Maintenance Due  Topic Date Due   Zoster Vaccines- Shingrix (2 of 2) 02/17/2019   COVID-19 Vaccine (4 - Pfizer series) 09/23/2020    There are no preventive care reminders to display for this patient.  Lab Results  Component Value Date   TSH 1.15 01/14/2020   Lab Results  Component Value Date   WBC 13.5 (H) 05/17/2021   HGB 12.6 05/17/2021   HCT 36.3 05/17/2021   MCV 86.0 05/17/2021   PLT 154 05/17/2021   Lab Results  Component Value Date   NA 140 05/17/2021   K 3.6 05/17/2021   CO2 26 05/17/2021   GLUCOSE 110 (H) 05/17/2021   BUN 27 (H) 05/17/2021   CREATININE 0.90 12/11/2021   BILITOT 0.7 04/14/2021   ALKPHOS 65 04/14/2021   AST 14 04/14/2021   ALT 11 04/14/2021   PROT 6.1 04/14/2021   ALBUMIN 4.2 04/14/2021   CALCIUM 9.3 05/17/2021   ANIONGAP 6 05/17/2021   GFR 66.82 04/14/2021   No results found for: "HGBA1C"    Assessment & Plan:   Problem List Items Addressed This Visit       Digestive    Melena    fobt pending results Although pt is confused whether rectal or urine, thinks urinary blood not stool however will test      Relevant Orders   Fecal occult blood, imunochemical     Genitourinary   Gross hematuria    Ordering urine culture pending results  Also ordering cbc      Acute cystitis with hematuria    Treated for complicated UTI in absence of recent GFR I will treat at renal dosing cipro 250 mg po bid x 7 days.  If any fever/chills/ go to er and or urgent care over the weekend, worry for pyelo      Relevant Medications   ciprofloxacin (CIPRO) 250 MG tablet     Other   Hematuria   Relevant Orders   Comprehensive metabolic panel   Urine Culture   POCT URINALYSIS DIP (CLINITEK) (Completed)   Other fatigue    Workup for fatigue to include tsh cbc cmp b12 folate       Relevant Orders   B12 and Folate Panel   TSH   CBC with Differential/Platelet   DOE (dyspnea on exertion) - Primary    New,  Pending cbc bnp  If any sudden worsening sob go to er and or call 911      Relevant Orders   Brain natriuretic peptide    Meds ordered this encounter  Medications   ciprofloxacin (CIPRO) 250 MG tablet    Sig:  Take 1 tablet (250 mg total) by mouth 2 (two) times daily for 7 days.    Dispense:  14 tablet    Refill:  0    Order Specific Question:   Supervising Provider    Answer:   Diona Browner, AMY E [[id number]]    Follow-up: Return in about 2 weeks (around 06/08/2022) for with pcp Dr. Silvio Pate.    Eugenia Pancoast, FNP

## 2022-05-25 NOTE — Patient Instructions (Signed)
Start 1000 mcg once daily of vitamin B12 over the counter.   It was a pleasure seeing you today.   You were found to have a urinary tract infection, you have been prescribed an antibiotic to your preferred pharmacy. Please start antibiotic today as directed.   We are sending your urine for a culture to make sure you do not have a resistant bacteria. We will call you if we need to change your medications.   Please make sure you are drinking plenty of fluids over the next few days.  If your symptoms do not improve over the next 5-7 days, or if they worsen, please let us know. Please also let us know if you have worsening back pain, fevers, chills, or body aches.   Regards,   Eugenia Pancoast

## 2022-05-25 NOTE — Assessment & Plan Note (Signed)
Ordering urine culture pending results  Also ordering cbc

## 2022-05-25 NOTE — Assessment & Plan Note (Signed)
Workup for fatigue to include tsh cbc cmp b12 folate

## 2022-05-25 NOTE — Progress Notes (Signed)
Pending urine culture  Rx cipro already sent

## 2022-05-25 NOTE — Assessment & Plan Note (Addendum)
fobt pending results Although pt is confused whether rectal or urine, thinks urinary blood not stool however will test

## 2022-05-25 NOTE — Assessment & Plan Note (Signed)
Treated for complicated UTI in absence of recent GFR I will treat at renal dosing cipro 250 mg po bid x 7 days.  If any fever/chills/ go to er and or urgent care over the weekend, worry for pyelo

## 2022-05-27 LAB — URINE CULTURE
MICRO NUMBER:: 13648660
SPECIMEN QUALITY:: ADEQUATE

## 2022-05-28 ENCOUNTER — Other Ambulatory Visit: Payer: Medicare Other

## 2022-05-28 NOTE — Progress Notes (Signed)
Pt with UTI confirmed Cipro that was sent in was a good medication for this.  How is pt feeling? Is she having improvement? Less fatigue?

## 2022-05-31 ENCOUNTER — Other Ambulatory Visit: Payer: Self-pay | Admitting: Internal Medicine

## 2022-05-31 DIAGNOSIS — Z1211 Encounter for screening for malignant neoplasm of colon: Secondary | ICD-10-CM

## 2022-06-05 ENCOUNTER — Other Ambulatory Visit: Payer: Self-pay | Admitting: Internal Medicine

## 2022-06-05 DIAGNOSIS — Z1211 Encounter for screening for malignant neoplasm of colon: Secondary | ICD-10-CM

## 2022-06-05 LAB — FECAL OCCULT BLOOD, IMMUNOCHEMICAL: Fecal Occult Bld: NEGATIVE

## 2022-06-07 ENCOUNTER — Telehealth: Payer: Self-pay | Admitting: Radiology

## 2022-06-07 NOTE — Telephone Encounter (Signed)
LVM to cancel lab appt per Dr Silvio Pate on 8.2.23.

## 2022-06-12 ENCOUNTER — Ambulatory Visit: Payer: Medicare Other | Admitting: Urology

## 2022-06-13 ENCOUNTER — Other Ambulatory Visit: Payer: Medicare Other

## 2022-06-19 ENCOUNTER — Ambulatory Visit: Payer: Medicare Other

## 2022-06-25 ENCOUNTER — Encounter: Payer: Self-pay | Admitting: Internal Medicine

## 2022-07-30 ENCOUNTER — Encounter: Payer: Self-pay | Admitting: Internal Medicine

## 2022-07-30 ENCOUNTER — Ambulatory Visit (INDEPENDENT_AMBULATORY_CARE_PROVIDER_SITE_OTHER): Payer: Medicare Other | Admitting: Internal Medicine

## 2022-07-30 VITALS — BP 124/80 | HR 60 | Temp 97.2°F | Ht 63.5 in | Wt 150.0 lb

## 2022-07-30 DIAGNOSIS — I471 Supraventricular tachycardia: Secondary | ICD-10-CM | POA: Diagnosis not present

## 2022-07-30 DIAGNOSIS — K219 Gastro-esophageal reflux disease without esophagitis: Secondary | ICD-10-CM | POA: Diagnosis not present

## 2022-07-30 DIAGNOSIS — D696 Thrombocytopenia, unspecified: Secondary | ICD-10-CM | POA: Insufficient documentation

## 2022-07-30 DIAGNOSIS — Z0000 Encounter for general adult medical examination without abnormal findings: Secondary | ICD-10-CM | POA: Diagnosis not present

## 2022-07-30 DIAGNOSIS — N1831 Chronic kidney disease, stage 3a: Secondary | ICD-10-CM | POA: Insufficient documentation

## 2022-07-30 DIAGNOSIS — N952 Postmenopausal atrophic vaginitis: Secondary | ICD-10-CM | POA: Diagnosis not present

## 2022-07-30 DIAGNOSIS — I10 Essential (primary) hypertension: Secondary | ICD-10-CM

## 2022-07-30 NOTE — Progress Notes (Signed)
Subjective:    Patient ID: Dominique Hall, female    DOB: Dec 02, 1934, 87 y.o.   MRN: [id number]  HPI Here with husband for Medicare wellness visit and follow up of chronic health conditions Reviewed advanced directives Reviewed other doctors---Dr Gollan--cardiology, Dr Novella Olive, Dr Burnard Leigh, [facility] No hospitalizations or surgery this year Daily small drink many days (usually wine) No tobacco Not exercising --discussed Vision is okay Hearing aides in both ears are helpful No falls No depression or anhedonia Independent with instrumental ADLs Mild memory problems   Still having "spells" Will feel weak and lightheaded after eating breakfast Okay if she waits a few minutes--but has trouble if she tries to jump up Drinks a soda and eats   No chest pain Slight short winded---if rushes around Can also get DOE walking quick to keep up with husband, etc No regular dizziness or syncope No recent palpitations No edema  Has spot on left leg to be checked Been there a long time but has changed  Last GFR 58  Plt count 134K last time No abnormal bleeding--hematuria work up negative  Current Outpatient Medications on File Prior to Visit  Medication Sig Dispense Refill   celecoxib (CELEBREX) 200 MG capsule TAKE 1 CAPSULE BY MOUTH EVERY DAY 90 capsule 3   Cholecalciferol (VITAMIN D-3) 25 MCG (1000 UT) CAPS Take 1 capsule by mouth daily.     clorazepate (TRANXENE) 7.5 MG tablet TAKE 1 TO 2 TABLETS BY MOUTH AT BEDTIME AS NEEDED (Patient taking differently: as needed. TAKE 1 TO 2 TABLETS BY MOUTH AT BEDTIME AS NEEDED) 60 tablet 0   estradiol (ESTRACE) 0.1 MG/GM vaginal cream APPLY PEA SIZED AMOUNT VAGINALLY 2 TIMES A WEEK 42.5 g 3   metoprolol tartrate (LOPRESSOR) 50 MG tablet Take 1 tablet (50 mg total) by mouth 2 (two) times daily. 180 tablet 3   NON FORMULARY Immunity support gummies Takes 1 gummy daily.     NON FORMULARY Black elderberry gummy  Takes 1 gummy qd.     omeprazole (PRILOSEC) 20 MG capsule TAKE 1 CAPSULE(20 MG) BY MOUTH DAILY 90 capsule 3   vitamin C (ASCORBIC ACID) 500 MG tablet Take 500 mg by mouth daily.     No current facility-administered medications on file prior to visit.    Allergies  Allergen Reactions   Penicillins     Has patient had a PCN reaction causing immediate rash, facial/tongue/throat swelling, SOB or lightheadedness with hypotension: Unknown Has patient had a PCN reaction causing severe rash involving mucus membranes or skin necrosis: Unknown Has patient had a PCN reaction that required hospitalization: Unknown Has patient had a PCN reaction occurring within the last 10 years: Unknown If all of the above answers are "NO", then may proceed with Cephalosporin use.   Rabeprazole Sodium     REACTION: constipated   Sulfonamide Derivatives     REACTION: unspecified   Cefuroxime     diarrhea    Past Medical History:  Diagnosis Date   Allergy    Arthritis    Chest pain    a. 07/2019 MV: No ischemia/infarct. Low risk; b. 04/2021 MV: No isch/scar. EF 60%. No significant cor Ca2+. Developed atrial tachycardia during study.   Diverticulosis    Esophageal dysmotility    Esophageal ring    GERD (gastroesophageal reflux disease)    Hiatal hernia    HTN (hypertension)    IBS (irritable bowel syndrome)    Osteopenia    PAH (pulmonary artery hypertension) ([place])  a. 07/2019 Echo: EF 60-65%, impaired relaxation. Nl RV size/fxn. PASP 74mHg. Mildly dil LA.   Palpitations    Pre-syncope    Renal disorder    Sleep disturbance     Past Surgical History:  Procedure Laterality Date   ABDOMINAL HYSTERECTOMY     APPENDECTOMY  1973   BREAST EXCISIONAL BIOPSY Right    CHOLECYSTECTOMY     ERCP N/A 11/22/2017   Procedure: ENDOSCOPIC RETROGRADE CHOLANGIOPANCREATOGRAPHY (ERCP);  Surgeon: WLucilla Lame MD;  [facility];  Service: Endoscopy;  Laterality: N/A;   ESOPHAGOGASTRODUODENOSCOPY  07-2006    with dilation   KIDNEY SURGERY  1970   lumpectomy  [phone]  benign right breast   SQUAMOUS CELL CARCINOMA EXCISION  6/12   Right leg--Dr ECresenciano LickCELL CARCINOMA EXCISION  2012   right calf   TOTAL ABDOMINAL HYSTERECTOMY W/ BILATERAL SALPINGOOPHORECTOMY  1973    Family History  Problem Relation Age of Onset   Heart attack Father    Stroke Mother    Melanoma Daughter     Social History   Socioeconomic History   Marital status: Married    Spouse name: Not on file   Number of children: 2   Years of education: Not on file   Highest education level: Not on file  Occupational History   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[employer]  Tobacco Use   Smoking status: Former    Passive exposure: Never   Smokeless tobacco: Never  VScientific laboratory technicianUse: Never used  Substance and Sexual Activity   Alcohol use: Not Currently    Alcohol/week: 1.0 standard drink of alcohol    Types: 1 Glasses of wine per week    Comment: occ   Drug use: No   Sexual activity: Not on file  Other Topics Concern   Not on file  Social History Narrative   Has living will   Husband is [employer] health care POA---alternate is daughters Lynne/Amanda   Would accept resuscitation but no prolonged ventilation   Probably would not want tube feedings if cognitively unaware   Social Determinants of Health   Financial Resource Strain: Low Risk  (07/25/2021)   Overall Financial Resource Strain (CARDIA)    Difficulty of Paying Living Expenses: Not very hard  Food Insecurity: Not on file  Transportation Needs: Not on file  Physical Activity: Not on file  Stress: Not on file  Social Connections: Not on file  Intimate Partner Violence: Not on file   Review of Systems Appetite is okay Weight is stable Having some trouble staying asleep---despite melatonin. Gets up at 8AM--and goes to sleep 9:30PM. Counseled about sleep constriction Wears seat belt Teeth are fine--keeps up with dentist No heartburn or dysphagia No urinary  problems on the twice a week estrogen cream Bowels moving fine--no blood No sig back or joint pains    Objective:   Physical Exam Constitutional:      Appearance: Normal appearance.  HENT:     Mouth/Throat:     Comments: No lesions Eyes:     Conjunctiva/sclera: Conjunctivae normal.     Pupils: Pupils are equal, round, and reactive to light.  Cardiovascular:     Rate and Rhythm: Normal rate and regular rhythm.     Pulses: Normal pulses.     Heart sounds: No murmur heard.    No gallop.     Comments: Occ skips Pulmonary:     Effort: Pulmonary effort is normal.     Breath sounds: Normal  breath sounds. No wheezing or rales.  Abdominal:     Palpations: Abdomen is soft.     Tenderness: There is no abdominal tenderness.  Musculoskeletal:     Cervical back: Neck supple.     Right lower leg: No edema.     Left lower leg: No edema.  Lymphadenopathy:     Cervical: No cervical adenopathy.  Skin:    Comments: 2 cm reddish lesion on lower left leg---not neoplastic (and long standing)---reassured  Neurological:     General: No focal deficit present.     Mental Status: She is alert and oriented to person, place, and time.     Comments: Mini-cog fair---some issues with clock, recall 3/3  Psychiatric:        Mood and Affect: Mood normal.        Behavior: Behavior normal.            Assessment & Plan:

## 2022-07-30 NOTE — Assessment & Plan Note (Signed)
Borderline Will recheck

## 2022-07-30 NOTE — Assessment & Plan Note (Signed)
Quiet on the omeprazole '20mg'$  daily

## 2022-07-30 NOTE — Assessment & Plan Note (Signed)
Mild  No abnormal bleeding Will recheck

## 2022-07-30 NOTE — Progress Notes (Signed)
Hearing Screening - Comments:: Has hearing aids. Wearing them today. Vision Screening - Comments:: August 2023  

## 2022-07-30 NOTE — Assessment & Plan Note (Signed)
This is better since increased metoprolol to 50 bid She gets slight AM lightheadedness---but not limiting

## 2022-07-30 NOTE — Assessment & Plan Note (Signed)
I have personally reviewed the Medicare Annual Wellness questionnaire and have noted 1. The patient's medical and social history 2. Their use of alcohol, tobacco or illicit drugs 3. Their current medications and supplements 4. The patient's functional ability including ADL's, fall risks, home safety risks and hearing or visual             impairment. 5. Diet and physical activities 6. Evidence for depression or mood disorders  The patients weight, height, BMI and visual acuity have been recorded in the chart I have made referrals, counseling and provided education to the patient based review of the above and I have provided the pt with a written personalized care plan for preventive services.  I have provided you with a copy of your personalized plan for preventive services. Please take the time to review along with your updated medication list.  No cancer screening due to age 87 and flu vaccines at pharmacy soon Needs to start walking, etc again 2nd shingrix at pharmacy also

## 2022-07-30 NOTE — Assessment & Plan Note (Signed)
BP Readings from Last 3 Encounters:  07/30/22 124/80  05/25/22 (!) 98/50  03/19/22 110/64   Good control on the metoprolol

## 2022-07-30 NOTE — Assessment & Plan Note (Signed)
Urinary symptoms better on the topical estrogen

## 2022-07-30 NOTE — Progress Notes (Signed)
   Subjective:    Patient ID: Dominique Hall, female    DOB: [birth year], 87 y.o.   MRN: [id number]  HPI Here with husband for Medicare wellness visit and follow up of chronic health conditions Reviewed advanced directives Reviewed other doctors---Dr Gollan--cardiology, Dr Novella Olive  Still having "spells" Will feel weak and lightheaded after eating breakfast Okay if she waits a few minutes--but has trouble if she tries to jump up Drinks a soda and eats  No chest pain Slight short winded---if rushes around Can also get DOE walking quick to keep up with husband, etc No regular dizziness or syncope No recent palpitations No edema   Review of Systems     Objective:   Physical Exam         Assessment & Plan:

## 2022-07-31 LAB — CBC
HCT: 38 % (ref 36.0–46.0)
Hemoglobin: 13 g/dL (ref 12.0–15.0)
MCHC: 34.2 g/dL (ref 30.0–36.0)
MCV: 85.3 fl (ref 78.0–100.0)
Platelets: 149 10*3/uL — ABNORMAL LOW (ref 150.0–400.0)
RBC: 4.45 Mil/uL (ref 3.87–5.11)
RDW: 13.5 % (ref 11.5–15.5)
WBC: 6 10*3/uL (ref 4.0–10.5)

## 2022-07-31 LAB — HEPATIC FUNCTION PANEL
ALT: 10 U/L (ref 0–35)
AST: 14 U/L (ref 0–37)
Albumin: 4.2 g/dL (ref 3.5–5.2)
Alkaline Phosphatase: 79 U/L (ref 39–117)
Bilirubin, Direct: 0.2 mg/dL (ref 0.0–0.3)
Total Bilirubin: 0.8 mg/dL (ref 0.2–1.2)
Total Protein: 6.6 g/dL (ref 6.0–8.3)

## 2022-07-31 LAB — RENAL FUNCTION PANEL
Albumin: 4.2 g/dL (ref 3.5–5.2)
BUN: 28 mg/dL — ABNORMAL HIGH (ref 6–23)
CO2: 29 mEq/L (ref 19–32)
Calcium: 9.7 mg/dL (ref 8.4–10.5)
Chloride: 105 mEq/L (ref 96–112)
Creatinine, Ser: 0.82 mg/dL (ref 0.40–1.20)
GFR: 65.25 mL/min (ref >60.00)
Glucose, Bld: 84 mg/dL (ref 70–99)
Phosphorus: 4 mg/dL (ref 2.3–4.6)
Potassium: 4.3 mEq/L (ref 3.5–5.1)
Sodium: 141 mEq/L (ref 135–145)

## 2022-08-10 ENCOUNTER — Ambulatory Visit: Payer: Medicare Other | Admitting: Cardiovascular Disease

## 2022-09-23 NOTE — Progress Notes (Unsigned)
Date:  09/24/2022   ID:  Dominique Hall, DOB 1935-06-09, MRN [id number]  Patient Location:  [address] [place] [address]   Provider location:   [facility], [place] office  PCP:  Venia Carbon, MD  Cardiologist:  Patsy Baltimore  Chief Complaint  Patient presents with   6 month follow up     "Doing well." Medications reviewed by the patient verbally.     History of Present Illness:    Dominique Hall is a 87 y.o. female  past medical history of  chest pain.  palpitations  not diabetic and not a smoker. tachycardia in 2005 ,hospitalized at [facility] with negative work-up.   Who presents for routine follow-up of her chest pain episodes, lightheadedness  LOV May 2023 In follow-up today, reports that she feels well No significant tachycardia, on metoprolol 50 BID In general reports blood pressure well controlled Periodic anxiety, takes clorazepate as needed  Rare episodes of "sinking" /feeling weak, these have much improved by drinking glass of water every morning  Lab work reviewed  EKG personally reviewed by myself on todays visit Sinus bradycardia rate 58 bpm no significant ST or T wave changes  Zio Monitor Normal rhythm with frequent ectopy, short bursts of tachycardia Most of the triggered events seem to be associated with extra beats Recommendation made to increase metoprolol  tartrate up to 50 twice daily  Started higher dose metoprolol 50 BID this morning for paroxysmal tachycardia   Other issues reviewed Reports that she is fighting a UTI, on ABX, Klebsiella pneumoniae  Some recent low blood pressure Denies any near syncope or dizziness  Prior echocardiogram and stress test No ischemia, normal ejection fraction  Stress test 07/2019 Pharmacological myocardial perfusion imaging study with no significant  ischemia Grossly Normal wall motion, Unable to estimate EF secondary to GI uptake artifact No EKG changes  concerning for ischemia at peak stress or in recovery. Low risk scan  Echo  07/2019  1. Left ventricular ejection fraction, by visual estimation, is 60 to 65%. The left ventricle has normal function. Normal left ventricular size. There is no left ventricular hypertrophy.  2. Left ventricular diastolic Doppler parameters are consistent with impaired relaxation pattern of LV diastolic filling.  3. Global right ventricle has normal systolic function.The right ventricular size is normal. No increase in right ventricular wall thickness.  4. Mild to moderately elevated pulmonary artery systolic pressure 42 mm Hg.  5. Left atrial size was mildly dilated.  6. Right atrial size was normal.   Past Medical History:  Diagnosis Date   Allergy    Arthritis    Chest pain    a. 07/2019 MV: No ischemia/infarct. Low risk; b. 04/2021 MV: No isch/scar. EF 60%. No significant cor Ca2+. Developed atrial tachycardia during study.   Diverticulosis    Esophageal dysmotility    Esophageal ring    GERD (gastroesophageal reflux disease)    Hiatal hernia    HTN (hypertension)    IBS (irritable bowel syndrome)    Osteopenia    PAH (pulmonary artery hypertension) ([place])    a. 07/2019 Echo: EF 60-65%, impaired relaxation. Nl RV size/fxn. PASP 99mHg. Mildly dil LA.   Palpitations    Pre-syncope    Renal disorder    Sleep disturbance    Past Surgical History:  Procedure Laterality Date   ABDOMINAL HYSTERECTOMY     APPENDECTOMY  1973   BREAST EXCISIONAL BIOPSY Right  CHOLECYSTECTOMY     ERCP N/A 11/22/2017   Procedure: ENDOSCOPIC RETROGRADE CHOLANGIOPANCREATOGRAPHY (ERCP);  Surgeon: Lucilla Lame, MD;  Location[facility];  Service: Endoscopy;  Laterality: N/A;   ESOPHAGOGASTRODUODENOSCOPY  07-2006   with dilation   KIDNEY SURGERY  1970   lumpectomy  [phone]   benign right breast   SQUAMOUS CELL CARCINOMA EXCISION  6/12   Right leg--Dr Cresenciano Lick CELL CARCINOMA EXCISION  2012   right calf   TOTAL  ABDOMINAL HYSTERECTOMY W/ BILATERAL SALPINGOOPHORECTOMY  1973     Allergies:   Penicillins, Rabeprazole sodium, Sulfonamide derivatives, and Cefuroxime   Social History   Tobacco Use   Smoking status: Former    Passive exposure: Never   Smokeless tobacco: Never  Vaping Use   Vaping Use: Never used  Substance Use Topics   Alcohol use: Not Currently    Alcohol/week: 1.0 standard drink of alcohol    Types: 1 Glasses of wine per week    Comment: occ   Drug use: No     Current Outpatient Medications on File Prior to Visit  Medication Sig Dispense Refill   celecoxib (CELEBREX) 200 MG capsule TAKE 1 CAPSULE BY MOUTH EVERY DAY 90 capsule 3   Cholecalciferol (VITAMIN D-3) 25 MCG (1000 UT) CAPS Take 1 capsule by mouth daily.     clorazepate (TRANXENE) 7.5 MG tablet TAKE 1 TO 2 TABLETS BY MOUTH AT BEDTIME AS NEEDED (Patient taking differently: as needed. TAKE 1 TO 2 TABLETS BY MOUTH AT BEDTIME AS NEEDED) 60 tablet 0   estradiol (ESTRACE) 0.1 MG/GM vaginal cream APPLY PEA SIZED AMOUNT VAGINALLY 2 TIMES A WEEK 42.5 g 3   metoprolol tartrate (LOPRESSOR) 50 MG tablet Take 1 tablet (50 mg total) by mouth 2 (two) times daily. 180 tablet 3   NON FORMULARY Immunity support gummies Takes 1 gummy daily.     NON FORMULARY Black elderberry gummy Takes 1 gummy qd.     omeprazole (PRILOSEC) 20 MG capsule TAKE 1 CAPSULE(20 MG) BY MOUTH DAILY 90 capsule 3   vitamin C (ASCORBIC ACID) 500 MG tablet Take 500 mg by mouth daily.     No current facility-administered medications on file prior to visit.   Family Hx: The patient's family history includes Heart attack in her father; Melanoma in her daughter; Stroke in her mother.  ROS:   Please see the history of present illness.    Review of Systems  Constitutional: Negative.   HENT: Negative.    Respiratory: Negative.    Cardiovascular: Negative.   Gastrointestinal: Negative.   Musculoskeletal: Negative.   Neurological: Negative.    Psychiatric/Behavioral: Negative.    All other systems reviewed and are negative.    Labs/Other Tests and Data Reviewed:    Recent Labs: 05/25/2022: Pro B Natriuretic peptide (BNP) 146.0; TSH 0.67 07/30/2022: ALT 10; BUN 28; Creatinine, Ser 0.82; Hemoglobin 13.0; Platelets 149.0; Potassium 4.3; Sodium 141   Recent Lipid Panel Lab Results  Component Value Date/Time   CHOL 152 02/22/2014 04:12 PM   TRIG 98.0 02/22/2014 04:12 PM   HDL 63.00 02/22/2014 04:12 PM   CHOLHDL 2 02/22/2014 04:12 PM   LDLCALC 69 02/22/2014 04:12 PM    Wt Readings from Last 3 Encounters:  09/24/22 152 lb 4 oz (69.1 kg)  07/30/22 150 lb (68 kg)  05/25/22 147 lb 6 oz (66.8 kg)    Exam:    Vital Signs: Vital signs may also be detailed in the HPI BP (!) 150/80 (BP  Location: Left Arm, Patient Position: Sitting, Cuff Size: Normal)   Pulse (!) 58   Ht '5\' 5"'$  (1.651 m)   Wt 152 lb 4 oz (69.1 kg)   SpO2 95%   BMI 25.34 kg/m   Constitutional:  oriented to person, place, and time. No distress.  HENT:  Head: Grossly normal Eyes:  no discharge. No scleral icterus.  Neck: No JVD, no carotid bruits  Cardiovascular: Regular rate and rhythm, no murmurs appreciated Pulmonary/Chest: Clear to auscultation bilaterally, no wheezes or rails Abdominal: Soft.  no distension.  no tenderness.  Musculoskeletal: Normal range of motion Neurological:  normal muscle tone. Coordination normal. No atrophy Skin: Skin warm and dry Psychiatric: normal affect, pleasant  ASSESSMENT & PLAN:    Problem List Items Addressed This Visit       Cardiology Problems   Essential hypertension, benign   SVT (supraventricular tachycardia) - Primary     Other   Postural dizziness   Other Visit Diagnoses     Lightheadedness       Chest pain of uncertain etiology       SOB (shortness of breath)       Palpitations       Tachycardia-bradycardia syndrome (HCC)         Chest pain: Denies chest pain concerning for angina, no further  testing needed  Shortness of breath Likely from deconditioning Recommend walking program for conditioning and leg strength  Essential hypertension Continue metoprolol tartrate 50 twice daily  Recommend she monitor blood pressure at home, recently well controlled on office visit with primary care  Paroxysmal tachycardia on metoprolol tartrate 50 twice daily short narrow complex tachycardia on Zio monitor Symptoms controlled   Total encounter time more than 30 minutes  Greater than 50% was spent in counseling and coordination of care with the patient   Signed, Ida Rogue, [address], [place], [place] [address]

## 2022-09-24 ENCOUNTER — Encounter: Payer: Self-pay | Admitting: Cardiovascular Disease

## 2022-09-24 ENCOUNTER — Ambulatory Visit: Payer: Medicare Other | Attending: Cardiovascular Disease | Admitting: Cardiovascular Disease

## 2022-09-24 VITALS — BP 150/80 | HR 58 | Ht 65.0 in | Wt 152.2 lb

## 2022-09-24 DIAGNOSIS — R0602 Shortness of breath: Secondary | ICD-10-CM | POA: Diagnosis not present

## 2022-09-24 DIAGNOSIS — R002 Palpitations: Secondary | ICD-10-CM

## 2022-09-24 DIAGNOSIS — I471 Supraventricular tachycardia, unspecified: Secondary | ICD-10-CM

## 2022-09-24 DIAGNOSIS — R42 Dizziness and giddiness: Secondary | ICD-10-CM | POA: Diagnosis not present

## 2022-09-24 DIAGNOSIS — R079 Chest pain, unspecified: Secondary | ICD-10-CM

## 2022-09-24 DIAGNOSIS — I495 Sick sinus syndrome: Secondary | ICD-10-CM

## 2022-09-24 DIAGNOSIS — I10 Essential (primary) hypertension: Secondary | ICD-10-CM | POA: Diagnosis not present

## 2022-09-24 MED ORDER — METOPROLOL TARTRATE 50 MG PO TABS: 50.0000 mg | ORAL_TABLET | Freq: Two times a day (BID) | ORAL | 3 refills | Status: DC

## 2022-09-24 NOTE — Patient Instructions (Addendum)
Medication Instructions:  No changes  If you need a refill on your cardiac medications before your next appointment, please call your pharmacy.   Lab work: No new labs needed  Testing/Procedures: No new testing needed  Follow-Up: At [facility], you and your health needs are our priority.  As part of our continuing mission to provide you with exceptional heart care, we have created designated Provider Care Teams.  These Care Teams include your primary Cardiologist (physician) and Advanced Practice Providers (APPs -  Physician Assistants and Nurse Practitioners) who all work together to provide you with the care you need, when you need it.  You will need a follow up appointment in 12 months  Providers on your designated Care Team:   Christopher Berge, NP Ryan Dunn, PA-C Cadence Furth, PA-C  COVID-19 Vaccine Information can be found at: [email or web address] For questions related to vaccine distribution or appointments, please email [email or web address] or call [phone].   

## 2022-09-25 ENCOUNTER — Other Ambulatory Visit: Payer: Self-pay | Admitting: Internal Medicine

## 2022-09-25 DIAGNOSIS — G4700 Insomnia, unspecified: Secondary | ICD-10-CM

## 2022-09-25 MED ORDER — CLORAZEPATE DIPOTASSIUM 7.5 MG PO TABS: ORAL_TABLET | ORAL | 0 refills | Status: DC

## 2022-09-26 ENCOUNTER — Telehealth: Payer: Medicare Other

## 2022-10-15 ENCOUNTER — Ambulatory Visit: Payer: Medicare Other

## 2022-11-15 ENCOUNTER — Other Ambulatory Visit: Payer: Self-pay | Admitting: Internal Medicine

## 2022-11-15 DIAGNOSIS — G4700 Insomnia, unspecified: Secondary | ICD-10-CM

## 2022-11-15 NOTE — Telephone Encounter (Signed)
Last filled 09-25-22 #60 Last OV 07-30-22 No Future OV [facility] [address]

## 2022-12-06 ENCOUNTER — Ambulatory Visit: Payer: Medicare Other

## 2022-12-13 ENCOUNTER — Other Ambulatory Visit: Payer: Self-pay | Admitting: Internal Medicine

## 2022-12-13 ENCOUNTER — Ambulatory Visit (INDEPENDENT_AMBULATORY_CARE_PROVIDER_SITE_OTHER): Payer: 59 | Admitting: Internal Medicine

## 2022-12-13 ENCOUNTER — Encounter: Payer: Self-pay | Admitting: Internal Medicine

## 2022-12-13 VITALS — BP 124/74 | HR 74 | Temp 97.2°F | Ht 65.0 in | Wt 150.0 lb

## 2022-12-13 DIAGNOSIS — R319 Hematuria, unspecified: Secondary | ICD-10-CM | POA: Diagnosis not present

## 2022-12-13 LAB — POC URINALSYSI DIPSTICK (AUTOMATED)
Bilirubin, UA: NEGATIVE
Glucose, UA: NEGATIVE
Ketones, UA: NEGATIVE
Nitrite, UA: NEGATIVE
Protein, UA: POSITIVE — AB
Spec Grav, UA: 1.02 (ref 1.010–1.025)
Urobilinogen, UA: 0.2 E.U./dL
pH, UA: 6 (ref 5.0–8.0)

## 2022-12-13 MED ORDER — NITROFURANTOIN MONOHYD MACRO 100 MG PO CAPS: 100.0000 mg | ORAL_CAPSULE | Freq: Two times a day (BID) | ORAL | 1 refills | Status: DC

## 2022-12-13 NOTE — Progress Notes (Signed)
Subjective:    Patient ID: Dominique Hall, female    DOB: 30-Dec-1934, 88 y.o.   MRN: [id number]  HPI Here due to blood in her urine  Saw some blood in urine 2 nights ago Just once No dysuria or urgency No back pain or symptoms suggestive of stone  Uses OTC med for urine (Mense?--cranberry) Hasn't been taking the estrogen cream  Current Outpatient Medications on File Prior to Visit  Medication Sig Dispense Refill   celecoxib (CELEBREX) 200 MG capsule TAKE 1 CAPSULE BY MOUTH EVERY DAY 90 capsule 3   Cholecalciferol (VITAMIN D-3) 25 MCG (1000 UT) CAPS Take 1 capsule by mouth daily.     clorazepate (TRANXENE) 7.5 MG tablet TAKE 1 TO 2 TABLETS BY MOUTH AT BEDTIME AS NEEDED 60 tablet 0   estradiol (ESTRACE) 0.1 MG/GM vaginal cream APPLY PEA SIZED AMOUNT VAGINALLY 2 TIMES A WEEK 42.5 g 3   metoprolol tartrate (LOPRESSOR) 50 MG tablet Take 1 tablet (50 mg total) by mouth 2 (two) times daily. 180 tablet 3   NON FORMULARY Immunity support gummies Takes 1 gummy daily.     NON FORMULARY Black elderberry gummy Takes 1 gummy qd.     omeprazole (PRILOSEC) 20 MG capsule TAKE 1 CAPSULE(20 MG) BY MOUTH DAILY 90 capsule 3   vitamin C (ASCORBIC ACID) 500 MG tablet Take 500 mg by mouth daily.     No current facility-administered medications on file prior to visit.    Allergies  Allergen Reactions   Penicillins     Has patient had a PCN reaction causing immediate rash, facial/tongue/throat swelling, SOB or lightheadedness with hypotension: Unknown Has patient had a PCN reaction causing severe rash involving mucus membranes or skin necrosis: Unknown Has patient had a PCN reaction that required hospitalization: Unknown Has patient had a PCN reaction occurring within the last 10 years: Unknown If all of the above answers are "NO", then may proceed with Cephalosporin use.   Rabeprazole Sodium     REACTION: constipated   Sulfonamide Derivatives     REACTION: unspecified   Cefuroxime      diarrhea    Past Medical History:  Diagnosis Date   Allergy    Arthritis    Chest pain    a. 07/2019 MV: No ischemia/infarct. Low risk; b. 04/2021 MV: No isch/scar. EF 60%. No significant cor Ca2+. Developed atrial tachycardia during study.   Diverticulosis    Esophageal dysmotility    Esophageal ring    GERD (gastroesophageal reflux disease)    Hiatal hernia    HTN (hypertension)    IBS (irritable bowel syndrome)    Osteopenia    PAH (pulmonary artery hypertension) ([place])    a. 07/2019 Echo: EF 60-65%, impaired relaxation. Nl RV size/fxn. PASP 55mHg. Mildly dil LA.   Palpitations    Pre-syncope    Renal disorder    Sleep disturbance     Past Surgical History:  Procedure Laterality Date   ABDOMINAL HYSTERECTOMY     APPENDECTOMY  1973   BREAST EXCISIONAL BIOPSY Right    CHOLECYSTECTOMY     ERCP N/A 11/22/2017   Procedure: ENDOSCOPIC RETROGRADE CHOLANGIOPANCREATOGRAPHY (ERCP);  Surgeon: WLucilla Lame MD;  [facility];  Service: Endoscopy;  Laterality: N/A;   ESOPHAGOGASTRODUODENOSCOPY  07-2006   with dilation   KIDNEY SURGERY  1970   lumpectomy  [phone]  benign right breast   SQUAMOUS CELL CARCINOMA EXCISION  6/12   Right leg--Dr EPat Patrick  SQUAMOUS CELL CARCINOMA EXCISION  2012   right calf   TOTAL ABDOMINAL HYSTERECTOMY W/ BILATERAL SALPINGOOPHORECTOMY  1973    Family History  Problem Relation Age of Onset   Heart attack Father    Stroke Mother    Melanoma Daughter     Social History   Socioeconomic History   Marital status: Married    Spouse name: Not on file   Number of children: 2   Years of education: Not on file   Highest education level: Not on file  Occupational History   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  Tobacco Use   Smoking status: Former    Passive exposure: Never   Smokeless tobacco: Never  Scientific laboratory technician Use: Never used  Substance and Sexual Activity   Alcohol use: Not Currently    Alcohol/week: 1.0 standard drink of alcohol     Types: 1 Glasses of wine per week    Comment: occ   Drug use: No   Sexual activity: Not on file  Other Topics Concern   Not on file  Social History Narrative   Has living will   Husband is [place] health care POA---alternate is daughters Lynne/Amanda   Would accept resuscitation but no prolonged ventilation   Probably would not want tube feedings if cognitively unaware   Social Determinants of Health   Financial Resource Strain: Low Risk  (07/25/2021)   Overall Financial Resource Strain (CARDIA)    Difficulty of Paying Living Expenses: Not very hard  Food Insecurity: Not on file  Transportation Needs: Not on file  Physical Activity: Not on file  Stress: Not on file  Social Connections: Not on file  Intimate Partner Violence: Not on file   Review of Systems No fever No N/V Appetite is okay    Objective:   Physical Exam Constitutional:      Appearance: Normal appearance.  Abdominal:     Palpations: Abdomen is soft.     Tenderness: There is no abdominal tenderness. There is no right CVA tenderness or left CVA tenderness.  Neurological:     Mental Status: She is alert.            Assessment & Plan:

## 2022-12-13 NOTE — Assessment & Plan Note (Addendum)
Symptoms not suggestive of stone moving 3+ leuks so will try antibiotic July culture showed Klebsiella intermediate for macrobid--but can't take others besides cipro that we both want to avoid Try the macrobid for 3 days and send culture Can hold off on going back to the urologist for now Discussed restarting the vaginal estrogen--at least once a week

## 2022-12-13 NOTE — Addendum Note (Signed)
Addended by: Pilar Grammes on: 12/13/2022 11:42 AM   Modules accepted: Orders

## 2022-12-14 ENCOUNTER — Telehealth: Payer: Self-pay | Admitting: Internal Medicine

## 2022-12-14 LAB — URINE CULTURE
MICRO NUMBER:: 14505720
SPECIMEN QUALITY:: ADEQUATE

## 2022-12-14 MED ORDER — ESTRADIOL 0.1 MG/GM VA CREA: TOPICAL_CREAM | VAGINAL | 3 refills | Status: DC

## 2022-12-14 NOTE — Telephone Encounter (Signed)
Patient would like to know what she was prescribed medication omeprazole (PRILOSEC) 20 MG capsule  for? Is requesting a phone call.

## 2022-12-14 NOTE — Telephone Encounter (Signed)
Spoke to pt. She was asking about the estradiol cream. She did not have a rx for it. I Have sent that in for him.

## 2022-12-17 ENCOUNTER — Encounter: Payer: Self-pay | Admitting: Internal Medicine

## 2022-12-17 ENCOUNTER — Ambulatory Visit (INDEPENDENT_AMBULATORY_CARE_PROVIDER_SITE_OTHER): Payer: 59 | Admitting: Internal Medicine

## 2022-12-17 VITALS — BP 110/70 | HR 66 | Temp 97.1°F | Ht 65.0 in | Wt 151.0 lb

## 2022-12-17 DIAGNOSIS — R310 Gross hematuria: Secondary | ICD-10-CM

## 2022-12-17 LAB — POC URINALSYSI DIPSTICK (AUTOMATED)
Bilirubin, UA: NEGATIVE
Glucose, UA: NEGATIVE
Nitrite, UA: NEGATIVE
Protein, UA: POSITIVE — AB
Spec Grav, UA: 1.025 (ref 1.010–1.025)
Urobilinogen, UA: 0.2 E.U./dL
pH, UA: 6 (ref 5.0–8.0)

## 2022-12-17 MED ORDER — CIPROFLOXACIN HCL 250 MG PO TABS: 250.0000 mg | ORAL_TABLET | Freq: Two times a day (BID) | ORAL | 0 refills | Status: AC

## 2022-12-17 NOTE — Assessment & Plan Note (Addendum)
Only 1 other event Could be related to persistent stones Culture was negative----and hard to tell if this still might be infectious (urinalysis still shows 2+ leuks)---but no specific symptoms like dysuria or urgency Will ask Dr Erlene Quan to see her again sooner Rx for cipro '250mg'$  bid x 3 days ----to take if she has more specific infection symptoms  She did have benign CT scan and cystoscopy last year

## 2022-12-17 NOTE — Addendum Note (Signed)
Addended by: Pilar Grammes on: 12/17/2022 03:04 PM   Modules accepted: Orders

## 2022-12-17 NOTE — Progress Notes (Signed)
Subjective:    Patient ID: Dominique Hall, female    DOB: 1935/03/28, 88 y.o.   MRN: [id number]  HPI Here due to ongoing visible blood in urine  Did try the macrodantin---then had "terrible diarrhea" Stopped it after 2 of them Did have slight right flank pain 1-2 days ago--very brief Some upset stomach as well (?from the med)----ice cream helped Woke this morning---brownish color to urine (not red) Clear at 11AM today  No fever  Current Outpatient Medications on File Prior to Visit  Medication Sig Dispense Refill   celecoxib (CELEBREX) 200 MG capsule TAKE 1 CAPSULE BY MOUTH EVERY DAY 90 capsule 3   Cholecalciferol (VITAMIN D-3) 25 MCG (1000 UT) CAPS Take 1 capsule by mouth daily.     clorazepate (TRANXENE) 7.5 MG tablet TAKE 1 TO 2 TABLETS BY MOUTH AT BEDTIME AS NEEDED 60 tablet 0   estradiol (ESTRACE) 0.1 MG/GM vaginal cream APPLY PEA SIZED AMOUNT VAGINALLY 2 TIMES A WEEK 42.5 g 3   metoprolol tartrate (LOPRESSOR) 50 MG tablet Take 1 tablet (50 mg total) by mouth 2 (two) times daily. 180 tablet 3   NON FORMULARY Immunity support gummies Takes 1 gummy daily.     NON FORMULARY Black elderberry gummy Takes 1 gummy qd.     omeprazole (PRILOSEC) 20 MG capsule TAKE 1 CAPSULE(20 MG) BY MOUTH DAILY 90 capsule 3   vitamin C (ASCORBIC ACID) 500 MG tablet Take 500 mg by mouth daily.     No current facility-administered medications on file prior to visit.    Allergies  Allergen Reactions   Macrobid [Nitrofurantoin] Diarrhea   Penicillins     Has patient had a PCN reaction causing immediate rash, facial/tongue/throat swelling, SOB or lightheadedness with hypotension: Unknown Has patient had a PCN reaction causing severe rash involving mucus membranes or skin necrosis: Unknown Has patient had a PCN reaction that required hospitalization: Unknown Has patient had a PCN reaction occurring within the last 10 years: Unknown If all of the above answers are "NO", then may proceed with  Cephalosporin use.   Rabeprazole Sodium     REACTION: constipated   Sulfonamide Derivatives     REACTION: unspecified   Cefuroxime     diarrhea    Past Medical History:  Diagnosis Date   Allergy    Arthritis    Chest pain    a. 07/2019 MV: No ischemia/infarct. Low risk; b. 04/2021 MV: No isch/scar. EF 60%. No significant cor Ca2+. Developed atrial tachycardia during study.   Diverticulosis    Esophageal dysmotility    Esophageal ring    GERD (gastroesophageal reflux disease)    Hiatal hernia    HTN (hypertension)    IBS (irritable bowel syndrome)    Osteopenia    PAH (pulmonary artery hypertension) ([place])    a. 07/2019 Echo: EF 60-65%, impaired relaxation. Nl RV size/fxn. PASP 44mHg. Mildly dil LA.   Palpitations    Pre-syncope    Renal disorder    Sleep disturbance     Past Surgical History:  Procedure Laterality Date   ABDOMINAL HYSTERECTOMY     APPENDECTOMY  1973   BREAST EXCISIONAL BIOPSY Right    CHOLECYSTECTOMY     ERCP N/A 11/22/2017   Procedure: ENDOSCOPIC RETROGRADE CHOLANGIOPANCREATOGRAPHY (ERCP);  Surgeon: WLucilla Lame MD;  [facility];  Service: Endoscopy;  Laterality: N/A;   ESOPHAGOGASTRODUODENOSCOPY  07-2006   with dilation   KIDNEY SURGERY  1970   lumpectomy  [phone]  benign right breast  SQUAMOUS CELL CARCINOMA EXCISION  6/12   Right leg--Dr Cresenciano Lick CELL CARCINOMA EXCISION  2012   right calf   TOTAL ABDOMINAL HYSTERECTOMY W/ BILATERAL SALPINGOOPHORECTOMY  1973    Family History  Problem Relation Age of Onset   Heart attack Father    Stroke Mother    Melanoma Daughter     Social History   Socioeconomic History   Marital status: Married    Spouse name: Not on file   Number of children: 2   Years of education: Not on file   Highest education level: Not on file  Occupational History   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  Tobacco Use   Smoking status: Former    Passive exposure: Never   Smokeless tobacco: Never  Brewing technologist Use: Never used  Substance and Sexual Activity   Alcohol use: Not Currently    Alcohol/week: 1.0 standard drink of alcohol    Types: 1 Glasses of wine per week    Comment: occ   Drug use: No   Sexual activity: Not on file  Other Topics Concern   Not on file  Social History Narrative   Has living will   Husband is [place] health care POA---alternate is daughters Lynne/Amanda   Would accept resuscitation but no prolonged ventilation   Probably would not want tube feedings if cognitively unaware   Social Determinants of Health   Financial Resource Strain: Low Risk  (07/25/2021)   Overall Financial Resource Strain (CARDIA)    Difficulty of Paying Living Expenses: Not very hard  Food Insecurity: Not on file  Transportation Needs: Not on file  Physical Activity: Not on file  Stress: Not on file  Social Connections: Not on file  Intimate Partner Violence: Not on file   Review of Systems No N/V Eating okay    Objective:   Physical Exam Constitutional:      Appearance: Normal appearance.  Abdominal:     Palpations: Abdomen is soft.     Tenderness: There is no abdominal tenderness. There is no right CVA tenderness or left CVA tenderness.  Neurological:     Mental Status: She is alert.            Assessment & Plan:

## 2022-12-18 ENCOUNTER — Telehealth: Payer: Self-pay

## 2022-12-18 NOTE — Telephone Encounter (Signed)
Spoke with pt husband  made an appt w/Shannon McGowan in [place] 2/12 at 11:15.

## 2022-12-18 NOTE — Telephone Encounter (Signed)
[name], MD  Kris Mouton, CMA Please make her an appointment with Sam or Larene [facility] for hematuria.  [name], MD  Left message for patient to call back and sent mychart message.

## 2022-12-18 NOTE — Telephone Encounter (Signed)
Patient advised. No pain so far but will go to ER if she develops severe pain again. Otherwise appointment kept on 12/24/22

## 2022-12-21 ENCOUNTER — Other Ambulatory Visit: Payer: Self-pay

## 2022-12-21 DIAGNOSIS — R310 Gross hematuria: Secondary | ICD-10-CM

## 2022-12-21 NOTE — Progress Notes (Unsigned)
12/24/2022 8:50 PM   Dominique Hall 06/23/1935 KN:[id number]  Referring provider: Venia Carbon, MD [address] [place],  [place] [id number]  Urological history: 1. High risk hematuria -former smoker -CTU (11/2021) - no worrisome findings -cysto (11/2021) - NED -reports of gross heme -UA ***  2. Nephrolithiasis -CTU (11/2021) - several stones in left lower pole   3. Renal cysts -CTU (11/2021) -  bilateral renal cysts, measuring up to 4.5 cm in the interpolar region of the left kidney  4. Cystocele -CTU (11/2021) - small cystocele  5. rUTI's -contributing factors of age, vaginal atrophy, cystocele -documented urine cultures over the last year  12/13/2022 MUF  05/25/2022 Klebsiella pneumoniae  03/13/2022 Klebsiella pneumoniae -vaginal estrogen cream    No chief complaint on file.   HPI: Dominique Hall is a 88 y.o. female who presents today for gross hematuria.     PMH: Past Medical History:  Diagnosis Date   Allergy    Arthritis    Chest pain    a. 07/2019 MV: No ischemia/infarct. Low risk; b. 04/2021 MV: No isch/scar. EF 60%. No significant cor Ca2+. Developed atrial tachycardia during study.   Diverticulosis    Esophageal dysmotility    Esophageal ring    GERD (gastroesophageal reflux disease)    Hiatal hernia    HTN (hypertension)    IBS (irritable bowel syndrome)    Osteopenia    PAH (pulmonary artery hypertension) ([place])    a. 07/2019 Echo: EF 60-65%, impaired relaxation. Nl RV size/fxn. PASP 81mHg. Mildly dil LA.   Palpitations    Pre-syncope    Renal disorder    Sleep disturbance     Surgical History: Past Surgical History:  Procedure Laterality Date   ABDOMINAL HYSTERECTOMY     APPENDECTOMY  1973   BREAST EXCISIONAL BIOPSY Right    CHOLECYSTECTOMY     ERCP N/A 11/22/2017   Procedure: ENDOSCOPIC RETROGRADE CHOLANGIOPANCREATOGRAPHY (ERCP);  Surgeon: WLucilla Lame MD;  [facility];  Service: Endoscopy;   Laterality: N/A;   ESOPHAGOGASTRODUODENOSCOPY  07-2006   with dilation   KIDNEY SURGERY  1970   lumpectomy  [phone]  benign right breast   SQUAMOUS CELL CARCINOMA EXCISION  6/12   Right leg--Dr EPat Patrick  SQUAMOUS CELL CARCINOMA EXCISION  2012   right calf   TOTAL ABDOMINAL HYSTERECTOMY W/ BILATERAL SALPINGOOPHORECTOMY  1973    Home Medications:  Allergies as of 12/24/2022       Reactions   Macrobid [nitrofurantoin] Diarrhea   Penicillins    Has patient had a PCN reaction causing immediate rash, facial/tongue/throat swelling, SOB or lightheadedness with hypotension: Unknown Has patient had a PCN reaction causing severe rash involving mucus membranes or skin necrosis: Unknown Has patient had a PCN reaction that required hospitalization: Unknown Has patient had a PCN reaction occurring within the last 10 years: Unknown If all of the above answers are "NO", then may proceed with Cephalosporin use.   Rabeprazole Sodium    REACTION: constipated   Sulfonamide Derivatives    REACTION: unspecified   Cefuroxime    diarrhea        Medication List        Accurate as of December 21, 2022  8:50 PM. If you have any questions, ask your nurse or doctor.          ascorbic acid 500 MG tablet Commonly known as: VITAMIN C Take 500 mg by mouth daily.   celecoxib 200 MG capsule  Commonly known as: CELEBREX TAKE 1 CAPSULE BY MOUTH EVERY DAY   clorazepate 7.5 MG tablet Commonly known as: TRANXENE TAKE 1 TO 2 TABLETS BY MOUTH AT BEDTIME AS NEEDED   estradiol 0.1 MG/GM vaginal cream Commonly known as: ESTRACE APPLY PEA SIZED AMOUNT VAGINALLY 2 TIMES A WEEK   metoprolol tartrate 50 MG tablet Commonly known as: LOPRESSOR Take 1 tablet (50 mg total) by mouth 2 (two) times daily.   NON FORMULARY Immunity support gummies Takes 1 gummy daily.   NON FORMULARY Black elderberry gummy Takes 1 gummy qd.   omeprazole 20 MG capsule Commonly known as: PRILOSEC TAKE 1 CAPSULE(20 MG) BY MOUTH  DAILY   Vitamin D-3 25 MCG (1000 UT) Caps Take 1 capsule by mouth daily.        Allergies:  Allergies  Allergen Reactions   Macrobid [Nitrofurantoin] Diarrhea   Penicillins     Has patient had a PCN reaction causing immediate rash, facial/tongue/throat swelling, SOB or lightheadedness with hypotension: Unknown Has patient had a PCN reaction causing severe rash involving mucus membranes or skin necrosis: Unknown Has patient had a PCN reaction that required hospitalization: Unknown Has patient had a PCN reaction occurring within the last 10 years: Unknown If all of the above answers are "NO", then may proceed with Cephalosporin use.   Rabeprazole Sodium     REACTION: constipated   Sulfonamide Derivatives     REACTION: unspecified   Cefuroxime     diarrhea    Family History: Family History  Problem Relation Age of Onset   Heart attack Father    Stroke Mother    Melanoma Daughter     Social History:  reports that she has quit smoking. She has never been exposed to tobacco smoke. She has never used smokeless tobacco. She reports that she does not currently use alcohol after a past usage of about 1.0 standard drink of alcohol per week. She reports that she does not use drugs.  ROS: Pertinent ROS in HPI  Physical Exam: There were no vitals taken for this visit.  Constitutional:  Well nourished. Alert and oriented, No acute distress. HEENT: [place] AT, moist mucus membranes.  Trachea midline, no masses. Cardiovascular: No clubbing, cyanosis, or edema. Respiratory: Normal respiratory effort, no increased work of breathing. GU: No CVA tenderness.  No bladder fullness or masses. Vulvovaginal atrophy w/ pallor, loss of rugae, introital retraction, excoriations.  Vulvar thinning, fusion of labia, clitoral hood retraction, prominent urethral meatus.   *** external genitalia, *** pubic hair distribution, no lesions.  Normal urethral meatus, no lesions, no prolapse, no discharge.   No  urethral masses, tenderness and/or tenderness. No bladder fullness, tenderness or masses. *** vagina mucosa, *** estrogen effect, no discharge, no lesions, *** pelvic support, *** cystocele and *** rectocele noted.  No cervical motion tenderness.  Uterus is freely mobile and non-fixed.  No adnexal/parametria masses or tenderness noted.  Anus and perineum are without rashes or lesions.   ***  Neurologic: Grossly intact, no focal deficits, moving all 4 extremities. Psychiatric: Normal mood and affect.    Laboratory Data: Lab Results  Component Value Date   WBC 6.0 07/30/2022   HGB 13.0 07/30/2022   HCT 38.0 07/30/2022   MCV 85.3 07/30/2022   PLT 149.0 (L) 07/30/2022    Lab Results  Component Value Date   CREATININE 0.82 07/30/2022    Lab Results  Component Value Date   TSH 0.67 05/25/2022    Lab Results  Component Value Date  AST 14 07/30/2022   Lab Results  Component Value Date   ALT 10 07/30/2022     Urinalysis ***  I have reviewed the labs.   Pertinent Imaging: ***  Assessment & Plan:  ***  1. Gross hematuria -former smoker -CTU and cysto - no worrisome findings -reports of gross heme -UA ***  2. Nephrolithiasis -left renal calculi  3. Renal cysts -not the source of hematuria  4. Cystocele ***    No follow-ups on file.  These notes generated with voice recognition software. I apologize for typographical errors.  [place], [place] [address]  [place], [place] [address] [phone]

## 2022-12-24 ENCOUNTER — Ambulatory Visit (INDEPENDENT_AMBULATORY_CARE_PROVIDER_SITE_OTHER): Payer: Medicare Other | Admitting: Urology

## 2022-12-24 ENCOUNTER — Ambulatory Visit
Admission: RE | Admit: 2022-12-24 | Discharge: 2022-12-24 | Disposition: A | Discharge status: Home / Self Care | Payer: Medicare Other | Source: Ambulatory Visit | Attending: Urology | Admitting: Urology

## 2022-12-24 ENCOUNTER — Other Ambulatory Visit: Payer: Self-pay

## 2022-12-24 ENCOUNTER — Encounter: Payer: Self-pay | Admitting: Urology

## 2022-12-24 ENCOUNTER — Other Ambulatory Visit
Admission: RE | Admit: 2022-12-24 | Discharge: 2022-12-24 | Disposition: A | Discharge status: Home / Self Care | Payer: Medicare Other | Source: Home / Self Care | Attending: Urology | Admitting: Urology

## 2022-12-24 ENCOUNTER — Ambulatory Visit
Admission: RE | Admit: 2022-12-24 | Discharge: 2022-12-24 | Disposition: A | Discharge status: Home / Self Care | Payer: Medicare Other | Attending: Urology | Admitting: Urology

## 2022-12-24 VITALS — BP 153/76 | HR 91

## 2022-12-24 DIAGNOSIS — R310 Gross hematuria: Secondary | ICD-10-CM | POA: Diagnosis not present

## 2022-12-24 DIAGNOSIS — N200 Calculus of kidney: Secondary | ICD-10-CM

## 2022-12-24 DIAGNOSIS — R3915 Urgency of urination: Secondary | ICD-10-CM | POA: Diagnosis not present

## 2022-12-24 LAB — CBC WITH DIFFERENTIAL/PLATELET
Abs Immature Granulocytes: 0.02 K/uL (ref 0.00–0.07)
Basophils Absolute: 0 K/uL (ref 0.0–0.1)
Basophils Relative: 0 %
Eosinophils Absolute: 0.1 K/uL (ref 0.0–0.5)
Eosinophils Relative: 2 %
HCT: 38.9 % (ref 36.0–46.0)
Hemoglobin: 13.6 g/dL (ref 12.0–15.0)
Immature Granulocytes: 0 %
Lymphocytes Relative: 14 %
Lymphs Abs: 0.8 K/uL (ref 0.7–4.0)
MCH: 29.6 pg (ref 26.0–34.0)
MCHC: 35 g/dL (ref 30.0–36.0)
MCV: 84.6 fL (ref 80.0–100.0)
Monocytes Absolute: 0.4 K/uL (ref 0.1–1.0)
Monocytes Relative: 7 %
Neutro Abs: 4.5 K/uL (ref 1.7–7.7)
Neutrophils Relative %: 77 %
Platelets: 152 K/uL (ref 150–400)
RBC: 4.6 MIL/uL (ref 3.87–5.11)
RDW: 13 % (ref 11.5–15.5)
WBC: 5.9 K/uL (ref 4.0–10.5)
nRBC: 0 % (ref 0.0–0.2)

## 2022-12-24 LAB — URINALYSIS, COMPLETE (UACMP) WITH MICROSCOPIC
Bilirubin Urine: NEGATIVE
Glucose, UA: NEGATIVE mg/dL
Ketones, ur: 15 mg/dL — AB
Nitrite: NEGATIVE
Protein, ur: 100 mg/dL — AB
RBC / HPF: >50 RBC/hpf (ref 0–5)
Specific Gravity, Urine: 1.015 (ref 1.005–1.030)
pH: 6 (ref 5.0–8.0)

## 2022-12-24 LAB — BASIC METABOLIC PANEL WITH GFR
Anion gap: 8 (ref 5–15)
BUN: 24 mg/dL — ABNORMAL HIGH (ref 8–23)
CO2: 27 mmol/L (ref 22–32)
Calcium: 9.2 mg/dL (ref 8.9–10.3)
Chloride: 105 mmol/L (ref 98–111)
Creatinine, Ser: 0.67 mg/dL (ref 0.44–1.00)
GFR, Estimated: >60 mL/min
Glucose, Bld: 137 mg/dL — ABNORMAL HIGH (ref 70–99)
Potassium: 3.1 mmol/L — ABNORMAL LOW (ref 3.5–5.1)
Sodium: 140 mmol/L (ref 135–145)

## 2022-12-24 LAB — BLADDER SCAN AMB NON-IMAGING

## 2022-12-25 ENCOUNTER — Ambulatory Visit
Admission: RE | Admit: 2022-12-25 | Discharge: 2022-12-25 | Disposition: A | Discharge status: Home / Self Care | Payer: Medicare Other | Source: Ambulatory Visit | Attending: Urology | Admitting: Urology

## 2022-12-25 DIAGNOSIS — R310 Gross hematuria: Secondary | ICD-10-CM | POA: Insufficient documentation

## 2022-12-25 LAB — URINE CULTURE

## 2022-12-25 MED ORDER — IOHEXOL 300 MG/ML  SOLN
125.0000 mL | Freq: Once | INTRAMUSCULAR | Status: AC | PRN
  Administered 2022-12-25: 125 mL via INTRAVENOUS

## 2022-12-26 ENCOUNTER — Ambulatory Visit: Payer: 59

## 2022-12-26 ENCOUNTER — Other Ambulatory Visit: Payer: Self-pay | Admitting: Urology

## 2022-12-26 MED ORDER — NITROFURANTOIN MONOHYD MACRO 100 MG PO CAPS: 100.0000 mg | ORAL_CAPSULE | Freq: Every day | ORAL | 0 refills | Status: DC

## 2023-01-02 NOTE — Telephone Encounter (Signed)
Patient states the stones are giving her more pain. She has no complaints of fever she is just wanting to get a sooner appointment to see what can be done next. Appointment has been changed.

## 2023-01-02 NOTE — Progress Notes (Signed)
01/03/2023 7:41 PM   Dominique Hall 01/22/1935 [id number]  Referring provider: Venia Carbon, MD [address] [place],  [place] [address]  Urological history: 1. High risk hematuria -former smoker -CTU (11/2021) - no worrisome findings -cysto (11/2021) - NED -reports of gross heme -UA > 50 RBC's  2. Nephrolithiasis -CTU (11/2021) - several stones in left lower pole   3. Renal cysts -CTU (11/2021) -  bilateral renal cysts, measuring up to 4.5 cm in the interpolar region of the left kidney  4. Cystocele -CTU (11/2021) - small cystocele  5. rUTI's -contributing factors of age, vaginal atrophy, cystocele -documented urine cultures over the last year  12/13/2022 MUF  05/25/2022 Klebsiella pneumoniae  03/13/2022 Klebsiella pneumoniae -vaginal estrogen cream    Chief Complaint  Patient presents with   Dysuria    HPI: Dominique Hall is a 88 y.o. female who presents today for further discussion on her stones with her husband, Dominique Hall, and daughter, Dominique Hall.    CT urogram (12/2022) 3 mm distal right ureteral calculus below the level of the sacral ureter. Associated mild right hydroureteronephrosis.  12 mm calculus in the left renal pelvis, without frank hydronephrosis. Additional bilateral nonobstructing renal calculi measuring up to 7 mm.  After she completed her CT scan on December 25, 2022, we discussed the results and she attempted to have a trial of medical expulsive therapy for 3 mm distal stone.  She presents to the office today stating that the right-sided pain has been constant and very bothersome and she is wanting now to move onto definitive stone treatment.  Patient denies any modifying or aggravating factors.  Patient denies any gross hematuria, dysuria or suprapubic/flank pain.  Patient denies any fevers, chills, nausea or vomiting.    Urinalysis color is yellow slightly cloudy, specific gravity greater than 1.030, 2+ blood, pH 5.5, 3+  protein, trace leukocyte, 11-30 WBCs, greater than 30 RBCs, 0-10 epithelial cells, hyaline casts are present, mucus threads are present and moderate bacteria.  PMH: Past Medical History:  Diagnosis Date   Allergy    Arthritis    Chest pain    a. 07/2019 MV: No ischemia/infarct. Low risk; b. 04/2021 MV: No isch/scar. EF 60%. No significant cor Ca2+. Developed atrial tachycardia during study.   Diverticulosis    Esophageal dysmotility    Esophageal ring    GERD (gastroesophageal reflux disease)    Hiatal hernia    HTN (hypertension)    IBS (irritable bowel syndrome)    Osteopenia    PAH (pulmonary artery hypertension) ([place])    a. 07/2019 Echo: EF 60-65%, impaired relaxation. Nl RV size/fxn. PASP 40mHg. Mildly dil LA.   Palpitations    Pre-syncope    Renal disorder    Sleep disturbance     Surgical History: Past Surgical History:  Procedure Laterality Date   ABDOMINAL HYSTERECTOMY     APPENDECTOMY  1973   BREAST EXCISIONAL BIOPSY Right    CHOLECYSTECTOMY     ERCP N/A 11/22/2017   Procedure: ENDOSCOPIC RETROGRADE CHOLANGIOPANCREATOGRAPHY (ERCP);  Surgeon: WLucilla Lame MD;  [facility];  Service: Endoscopy;  Laterality: N/A;   ESOPHAGOGASTRODUODENOSCOPY  07-2006   with dilation   KIDNEY SURGERY  1970   lumpectomy  02-1991   benign right breast   SQUAMOUS CELL CARCINOMA EXCISION  6/12   Right leg--Dr EPat Patrick  SQUAMOUS CELL CARCINOMA EXCISION  2012   right calf   TOTAL ABDOMINAL HYSTERECTOMY W/ BILATERAL SALPINGOOPHORECTOMY  1973  Home Medications:  Allergies as of 01/03/2023       Reactions   Macrobid [nitrofurantoin] Diarrhea   Penicillins    Has patient had a PCN reaction causing immediate rash, facial/tongue/throat swelling, SOB or lightheadedness with hypotension: Unknown Has patient had a PCN reaction causing severe rash involving mucus membranes or skin necrosis: Unknown Has patient had a PCN reaction that required hospitalization: Unknown Has patient had  a PCN reaction occurring within the last 10 years: Unknown If all of the above answers are "NO", then may proceed with Cephalosporin use.   Rabeprazole Sodium    REACTION: constipated   Sulfonamide Derivatives    REACTION: unspecified   Cefuroxime    diarrhea        Medication List        Accurate as of January 03, 2023 11:59 PM. If you have any questions, ask your nurse or doctor.          ascorbic acid 500 MG tablet Commonly known as: VITAMIN C Take 500 mg by mouth daily.   celecoxib 200 MG capsule Commonly known as: CELEBREX TAKE 1 CAPSULE BY MOUTH EVERY DAY   ciprofloxacin 250 MG tablet Commonly known as: Cipro Take 1 tablet (250 mg total) by mouth 2 (two) times daily. Started by: Zara Council, PA-C   clobetasol ointment 0.05 % Commonly known as: TEMOVATE SMARTSIG:1 Topical Daily   clorazepate 7.5 MG tablet Commonly known as: TRANXENE TAKE 1 TO 2 TABLETS BY MOUTH AT BEDTIME AS NEEDED   estradiol 0.1 MG/GM vaginal cream Commonly known as: ESTRACE APPLY PEA SIZED AMOUNT VAGINALLY 2 TIMES A WEEK   metoprolol tartrate 50 MG tablet Commonly known as: LOPRESSOR Take 1 tablet (50 mg total) by mouth 2 (two) times daily.   nitrofurantoin (macrocrystal-monohydrate) 100 MG capsule Commonly known as: MACROBID Take 1 capsule (100 mg total) by mouth at bedtime.   NON FORMULARY Immunity support gummies Takes 1 gummy daily.   NON FORMULARY Black elderberry gummy Takes 1 gummy qd.   omeprazole 20 MG capsule Commonly known as: PRILOSEC TAKE 1 CAPSULE(20 MG) BY MOUTH DAILY   Vitamin D-3 25 MCG (1000 UT) Caps Take 1 capsule by mouth daily.        Allergies:  Allergies  Allergen Reactions   Macrobid [Nitrofurantoin] Diarrhea   Penicillins     Has patient had a PCN reaction causing immediate rash, facial/tongue/throat swelling, SOB or lightheadedness with hypotension: Unknown Has patient had a PCN reaction causing severe rash involving mucus membranes  or skin necrosis: Unknown Has patient had a PCN reaction that required hospitalization: Unknown Has patient had a PCN reaction occurring within the last 10 years: Unknown If all of the above answers are "NO", then may proceed with Cephalosporin use.   Rabeprazole Sodium     REACTION: constipated   Sulfonamide Derivatives     REACTION: unspecified   Cefuroxime     diarrhea    Family History: Family History  Problem Relation Age of Onset   Heart attack Father    Stroke Mother    Melanoma Daughter     Social History:  reports that she has quit smoking. She has never been exposed to tobacco smoke. She has never used smokeless tobacco. She reports that she does not currently use alcohol after a past usage of about 1.0 standard drink of alcohol per week. She reports that she does not use drugs.  ROS: Pertinent ROS in HPI  Physical Exam: BP (!) 160/82   Pulse  69   Constitutional:  Well nourished. Alert and oriented, No acute distress. HEENT: [place] AT, moist mucus membranes.  Trachea midline Cardiovascular: No clubbing, cyanosis, or edema. Respiratory: Normal respiratory effort, no increased work of breathing. Neurologic: Grossly intact, no focal deficits, moving all 4 extremities. Psychiatric: Normal mood and affect.   Laboratory Data: Urinalysis See EPIC and HPI  I have reviewed the labs.   Pertinent Imaging: CLINICAL DATA:  Gross hematuria   EXAM: CT ABDOMEN AND PELVIS WITHOUT AND WITH CONTRAST   TECHNIQUE: Multidetector CT imaging of the abdomen and pelvis was performed following the standard protocol before and following the bolus administration of intravenous contrast.   RADIATION DOSE REDUCTION: This exam was performed according to the departmental dose-optimization program which includes automated exposure control, adjustment of the mA and/or kV according to patient size and/or use of iterative reconstruction technique.   CONTRAST:  159m OMNIPAQUE IOHEXOL 300  MG/ML  SOLN   COMPARISON:  12/11/2021   FINDINGS: Lower chest: Mild peribronchovascular nodularity in the bilateral lower lobes measuring up to 7 mm in the posterior right lower lobe (series 3/image 3), new, favored to be infectious/inflammatory. Additional mild scarring/atelectasis at the lung bases.   Hepatobiliary: 2.3 cm benign hepatic cyst inferiorly in segment 6 (series 9/image 28). Additional 1.8 cm cyst in the caudate.   Status post cholecystectomy. Mild central intrahepatic ductal prominence. Common duct measures 10 mm and smoothly tapers at the ampulla, likely postsurgical.   Pancreas: Within normal limits.   Spleen: Within normal limits.   Adrenals/Urinary Tract: Adrenal glands are within normal limits.   Bilateral simple renal cysts, measuring up to 4.6 cm in the anterior left upper kidney (series 9/image 19), benign (Bosniak I). No follow-up is recommended.   Two punctate nonobstructing right renal calculi. Two nonobstructing left lower pole renal calculi measuring up to 7 mm (series 2/image 28). 12 mm calculus in the left renal pelvis (series 9/image 23), without frank hydronephrosis. Mild right hydroureteronephrosis with associated 3 mm distal right ureteral calculus below the level of the sacral ureter (coronal image 92).   Bladder is within normal limits.   Stomach/Bowel: Stomach is within normal limits.   Moderate duodenal diverticulum.  No evidence of bowel obstruction.   Appendix is not discretely visualized, likely surgically absent.   Mild sigmoid diverticulosis, without evidence of diverticulitis.   Vascular/Lymphatic: No evidence of abdominal aortic aneurysm.   Atherosclerotic calcifications of the abdominal aorta and branch vessels.   No suspicious abdominopelvic lymphadenopathy.   Reproductive: Status post hysterectomy.   Left ovary is within normal limits.  No adnexal mass.   Other: No abdominopelvic ascites.   Musculoskeletal: Mild  degenerative changes of the lumbar spine. Benign bone island at L4.   IMPRESSION: 3 mm distal right ureteral calculus below the level of the sacral ureter. Associated mild right hydroureteronephrosis.   12 mm calculus in the left renal pelvis, without frank hydronephrosis. Additional bilateral nonobstructing renal calculi measuring up to 7 mm.   Mild peribronchovascular nodularity in the bilateral lower lobes measuring up to 7 mm in the posterior right lower lobe, new, favored to be infectious/inflammatory. Given the appearance and the patient's age, dedicated follow-up is not recommended.   Additional ancillary findings as above.     Electronically Signed   By: SJulian HyM.D.   On: 12/25/2022 15:25 I have independently reviewed the films.  See HPI.    Assessment & Plan:    1. Right UVJ stone -she is wanting definitive  stone treatment  2. Left pelvic stone -She is wanting definitive stone treatment  -We discussed various treatment options for urolithiasis including observation with or without medical expulsive therapy, shockwave lithotripsy (SWL), ureteroscopy and laser lithotripsy with stent placement. -We discussed that management is based on stone size, location, density, patient co-morbidities, and patient preference.  -Stones <75m in size have a >80% spontaneous passage rate. Data surrounding the use of tamsulosin for medical expulsive therapy is controversial, but meta analyses suggests it is most efficacious for distal stones between 5-136min size. Possible side effects include dizziness/lightheadedness -SWL has a lower stone free rate in a single procedure, but also a lower complication rate compared to ureteroscopy and avoids a stent and associated stent related symptoms. Possible complications include renal hematoma, steinstrasse, and need for additional treatment. -Ureteroscopy with laser lithotripsy and stent placement has a higher stone free rate than SWL in  a single procedure, however increased complication rate including possible infection, ureteral injury, bleeding, and stent related morbidity. Common stent related symptoms include dysuria, urgency/frequency, and flank pain. -She is wanting to have bilateral ureteroscopic laser lithotripsy with bilateral stent placements - schedule bilateral ureteroscopy with laser lithotripsy and ureteral stent placement - explained to the patient how the procedure is performed and the risks involved -informed the patient that they will have an ureteral stent, which will remain in place for approximately 3-10 days and can be associated with flank pain, bladder pain, dysuria, urgency, frequency, urinary leakage, and gross hematuria. - stent may be removed in the office with a cystoscope or patient may be instructed to remove the stent themselves by the string and that is decided on the day of the procedure - residual stones within the kidney or ureter may be present after the procedure and may need to have these addressed at a different encounter - injury to the ureter is the most common intra-operative risk, it may result in an open procedure to correct the defect - infection and bleeding are also risks - explained the risks of general anesthesia, such as: MI, CVA, paralysis, coma and/or death. -UA -urine culture -Cipro 250 mg twice daily  - advised to contact our office or seek treatment in the ED if becomes febrile or pain/ vomiting are difficult control in order to arrange for emergent/urgent intervention    Return for bilateral URS/LL/stent placement w/ Dr. BrErlene Quan  These notes generated with voice recognition software. I apologize for typographical errors.  SHCalhounPACrook[address]Su[place]ton[place] [phone]

## 2023-01-03 ENCOUNTER — Other Ambulatory Visit: Payer: Self-pay | Admitting: Urology

## 2023-01-03 ENCOUNTER — Ambulatory Visit (INDEPENDENT_AMBULATORY_CARE_PROVIDER_SITE_OTHER): Payer: Medicare Other | Admitting: Urology

## 2023-01-03 VITALS — BP 160/82 | HR 62

## 2023-01-03 DIAGNOSIS — N201 Calculus of ureter: Secondary | ICD-10-CM

## 2023-01-03 DIAGNOSIS — R300 Dysuria: Secondary | ICD-10-CM

## 2023-01-03 DIAGNOSIS — N200 Calculus of kidney: Secondary | ICD-10-CM | POA: Diagnosis not present

## 2023-01-03 MED ORDER — CIPROFLOXACIN HCL 250 MG PO TABS: 250.0000 mg | ORAL_TABLET | Freq: Two times a day (BID) | ORAL | 0 refills | Status: DC

## 2023-01-03 NOTE — Progress Notes (Signed)
Surgical Physician Order Form [facility] [place]  * Scheduling expectation : ASAP  *Length of Case:   *Clearance needed: yes  *Anticoagulation Instructions: Hold all anticoagulants  *Aspirin Instructions: Hold Aspirin  *Post-op visit Date/Instructions:   TBD  *Diagnosis: 3 mm distal right ureteral stone and 12 mm stone in the left renal pelvis w/ bilateral stones  *Procedure: bilateral Ureteroscopy w/laser lithotripsy & stent placement [id number])   Additional orders: N/A  -Admit type: OUTpatient  -Anesthesia: General  -VTE Prophylaxis Standing Order SCD's       Other:   -Standing Lab Orders Per Anesthesia    Lab other: None  -Standing Test orders EKG/Chest x-ray per Anesthesia       Test other:   - Medications:  Cipro '400mg'$  IV  -Other orders:  N/A

## 2023-01-04 LAB — URINALYSIS, COMPLETE
Bilirubin, UA: NEGATIVE
Glucose, UA: NEGATIVE
Ketones, UA: NEGATIVE
Nitrite, UA: NEGATIVE
Specific Gravity, UA: >1.03 — ABNORMAL HIGH (ref 1.005–1.030)
Urobilinogen, Ur: 0.2 mg/dL (ref 0.2–1.0)
pH, UA: 5.5 (ref 5.0–7.5)

## 2023-01-04 LAB — MICROSCOPIC EXAMINATION: RBC, Urine: >30 /hpf — AB (ref 0–2)

## 2023-01-06 ENCOUNTER — Encounter: Payer: Self-pay | Admitting: Urology

## 2023-01-07 ENCOUNTER — Other Ambulatory Visit: Payer: Self-pay | Admitting: Urology

## 2023-01-07 DIAGNOSIS — N201 Calculus of ureter: Secondary | ICD-10-CM

## 2023-01-08 ENCOUNTER — Ambulatory Visit
Admission: RE | Admit: 2023-01-08 | Discharge: 2023-01-08 | Disposition: A | Discharge status: Home / Self Care | Payer: Medicare Other | Source: Ambulatory Visit | Attending: Urology | Admitting: Urology

## 2023-01-08 ENCOUNTER — Ambulatory Visit: Payer: Medicare Other | Admitting: Urology

## 2023-01-08 ENCOUNTER — Other Ambulatory Visit: Payer: Self-pay | Admitting: Family Medicine

## 2023-01-08 DIAGNOSIS — N201 Calculus of ureter: Secondary | ICD-10-CM

## 2023-01-08 LAB — CULTURE, URINE COMPREHENSIVE

## 2023-01-08 NOTE — Progress Notes (Signed)
Patient would like to hold off on surgery for now.

## 2023-01-21 ENCOUNTER — Ambulatory Visit
Admission: RE | Admit: 2023-01-21 | Discharge: 2023-01-21 | Disposition: A | Discharge status: Home / Self Care | Payer: Medicare Other | Source: Ambulatory Visit | Attending: Urology | Admitting: Urology

## 2023-01-21 ENCOUNTER — Ambulatory Visit
Admission: RE | Admit: 2023-01-21 | Discharge: 2023-01-21 | Disposition: A | Discharge status: Home / Self Care | Payer: Medicare Other | Attending: Urology | Admitting: Urology

## 2023-01-21 DIAGNOSIS — N201 Calculus of ureter: Secondary | ICD-10-CM | POA: Diagnosis present

## 2023-01-21 NOTE — Progress Notes (Signed)
01/22/2023 11:55 AM   Dominique Hall 11-02-1935 [id number]  Referring provider: Karie Schwalbe, MD [address] [place],  [place] [address]  Urological history: 1. High risk hematuria -former smoker -CTU (11/2021) - no worrisome findings -cysto (11/2021) - NED -reports of gross heme -UA > 50 RBC's  2. Nephrolithiasis -CTU (11/2021) - several stones in left lower pole   3. Renal cysts -CTU (11/2021) -  bilateral renal cysts, measuring up to 4.5 cm in the interpolar region of the left kidney  4. Cystocele -CTU (11/2021) - small cystocele  5. rUTI's -contributing factors of age, vaginal atrophy, cystocele -documented urine cultures over the last year  12/13/2022 MUF  05/25/2022 Klebsiella pneumoniae  03/13/2022 Klebsiella pneumoniae -vaginal estrogen cream    Chief Complaint  Patient presents with   Nephrolithiasis    HPI: Dominique Hall is a 88 y.o. female who presents today for further discussion on her stones with her husband, Dominique Maduro.    CT urogram (12/2022) 3 mm distal right ureteral calculus below the level of the sacral ureter. Associated mild right hydroureteronephrosis.  12 mm calculus in the left renal pelvis, without frank hydronephrosis. Additional bilateral nonobstructing renal calculi measuring up to 7 mm.  After she completed her CT scan on December 25, 2022, we discussed the results and she attempted to have a trial of medical expulsive therapy for 3 mm distal stone.    At her visit on 01/03/2023, She presents to the office today stating that the right-sided pain has been constant and very bothersome and she is wanting now to move onto definitive stone treatment.  Urinalysis color is yellow slightly cloudy, specific gravity greater than 1.030, 2+ blood, pH 5.5, 3+ protein, trace leukocyte, 11-30 WBCs, greater than 30 RBCs, 0-10 epithelial cells, hyaline casts are present, mucus threads are present and moderate bacteria.  Urine  culture grew out mixed urogenital flora.  At that time, we made tentative plans to pursue ureteroscopy.  Her pain lessened and the repeated CT renal stone study on January 08, 2023 and it demonstrated resolution of the right hydronephrosis and migration of her right ureteral calculi to the distal right ureter.  As she found her pain out of this intense, she decided to go back to pursuing medical expulsive therapy and follow-up in 2 weeks with KUB.  Today, she reports she is feeling well.  She had a quick tinge on the right lower side that lasted only a second and a couple days ago.  She has not passed any fragments that she is aware of.  Patient denies any modifying or aggravating factors.  Patient denies any gross hematuria, dysuria or suprapubic/flank pain.  Patient denies any fevers, chills, nausea or vomiting.    UA yellow slightly cloudy, specific gravity 1.020, 2+ blood, pH 6.0, 3+ protein, 1+ leukocyte, 11-30 WBCs, 11-30 RBCs, granular casts are present and moderate bacteria.  KUB Left renal stone, no right ureteral stones noted.   PMH: Past Medical History:  Diagnosis Date   Allergy    Arthritis    Chest pain    a. 07/2019 MV: No ischemia/infarct. Low risk; b. 04/2021 MV: No isch/scar. EF 60%. No significant cor Ca2+. Developed atrial tachycardia during study.   Diverticulosis    Esophageal dysmotility    Esophageal ring    GERD (gastroesophageal reflux disease)    Hiatal hernia    HTN (hypertension)    IBS (irritable bowel syndrome)    Osteopenia  PAH (pulmonary artery hypertension) ([facility])    a. 07/2019 Echo: EF 60-65%, impaired relaxation. Nl RV size/fxn. PASP . Mildly dil LA.   Palpitations    Pre-syncope    Renal disorder    Sleep disturbance     Surgical History: Past Surgical History:  Procedure Laterality Date   ABDOMINAL HYSTERECTOMY     APPENDECTOMY  1973   BREAST EXCISIONAL BIOPSY Right    CHOLECYSTECTOMY     ERCP N/A 11/22/2017   Procedure: ENDOSCOPIC  RETROGRADE CHOLANGIOPANCREATOGRAPHY (ERCP);  Surgeon: Midge Minium, MD;  [facility];  Service: Endoscopy;  Laterality: N/A;   ESOPHAGOGASTRODUODENOSCOPY  07-2006   with dilation   KIDNEY SURGERY  1970   lumpectomy  [phone]   benign right breast   SQUAMOUS CELL CARCINOMA EXCISION  6/12   Right leg--Dr Michela Pitcher   SQUAMOUS CELL CARCINOMA EXCISION  2012   right calf   TOTAL ABDOMINAL HYSTERECTOMY W/ BILATERAL SALPINGOOPHORECTOMY  1973    Home Medications:  Allergies as of 01/22/2023       Reactions   Macrobid [nitrofurantoin] Diarrhea   Penicillins    Has patient had a PCN reaction causing immediate rash, facial/tongue/throat swelling, SOB or lightheadedness with hypotension: Unknown Has patient had a PCN reaction causing severe rash involving mucus membranes or skin necrosis: Unknown Has patient had a PCN reaction that required hospitalization: Unknown Has patient had a PCN reaction occurring within the last 10 years: Unknown If all of the above answers are "NO", then may proceed with Cephalosporin use.   Rabeprazole Sodium    REACTION: constipated   Sulfonamide Derivatives    REACTION: unspecified   Cefuroxime    diarrhea        Medication List        Accurate as of January 22, 2023 11:55 AM. If you have any questions, ask your nurse or doctor.          STOP taking these medications    ciprofloxacin 250 MG tablet Commonly known as: Cipro Stopped by: Kailie Polus, PA-C   clorazepate 7.5 MG tablet Commonly known as: TRANXENE Stopped by: Silas Sedam, PA-C   estradiol 0.1 MG/GM vaginal cream Commonly known as: ESTRACE Stopped by: Michiel Cowboy, PA-C       TAKE these medications    ascorbic acid 500 MG tablet Commonly known as: VITAMIN C Take 500 mg by mouth daily.   celecoxib 200 MG capsule Commonly known as: CELEBREX TAKE 1 CAPSULE BY MOUTH EVERY DAY   clobetasol ointment 0.05 % Commonly known as: TEMOVATE SMARTSIG:1 Topical Daily    metoprolol tartrate 50 MG tablet Commonly known as: LOPRESSOR Take 1 tablet (50 mg total) by mouth 2 (two) times daily.   nitrofurantoin (macrocrystal-monohydrate) 100 MG capsule Commonly known as: MACROBID Take 1 capsule (100 mg total) by mouth at bedtime.   NON FORMULARY Immunity support gummies Takes 1 gummy daily.   NON FORMULARY Black elderberry gummy Takes 1 gummy qd.   omeprazole 20 MG capsule Commonly known as: PRILOSEC TAKE 1 CAPSULE(20 MG) BY MOUTH DAILY   Vitamin D-3 25 MCG (1000 UT) Caps Take 1 capsule by mouth daily.        Allergies:  Allergies  Allergen Reactions   Macrobid [Nitrofurantoin] Diarrhea   Penicillins     Has patient had a PCN reaction causing immediate rash, facial/tongue/throat swelling, SOB or lightheadedness with hypotension: Unknown Has patient had a PCN reaction causing severe rash involving mucus membranes or skin necrosis: Unknown Has patient had a  PCN reaction that required hospitalization: Unknown Has patient had a PCN reaction occurring within the last 10 years: Unknown If all of the above answers are "NO", then may proceed with Cephalosporin use.   Rabeprazole Sodium     REACTION: constipated   Sulfonamide Derivatives     REACTION: unspecified   Cefuroxime     diarrhea    Family History: Family History  Problem Relation Age of Onset   Heart attack Father    Stroke Mother    Melanoma Daughter     Social History:  reports that she has quit smoking. She has never been exposed to tobacco smoke. She has never used smokeless tobacco. She reports that she does not currently use alcohol after a past usage of about 1.0 standard drink of alcohol per week. She reports that she does not use drugs.  ROS: Pertinent ROS in HPI  Physical Exam: BP 136/80   Pulse 71   Ht 5\' 5"  (1.651 m)   Wt 150 lb (68 kg)   BMI 24.96 kg/m   Constitutional:  Well nourished. Alert and oriented, No acute distress. HEENT: [place] AT, moist mucus  membranes.  Trachea midline Cardiovascular: No clubbing, cyanosis, or edema. Respiratory: Normal respiratory effort, no increased work of breathing. Neurologic: Grossly intact, no focal deficits, moving all 4 extremities. Psychiatric: Normal mood and affect.    Laboratory Data: Urinalysis See EPIC and HPI  I have reviewed the labs.   Pertinent Imaging: Narrative & Impression  CLINICAL DATA:  kidney stone   EXAM: ABDOMEN - 1 VIEW   COMPARISON:  CT renal 01/08/2023, x-ray abdomen 12/24/2022   FINDINGS: The bowel gas pattern is normal. Total of three greater than punctate in size similar-appearing calcifications overlying the expected region of the left kidney measuring up to 1.3 cm. Phleboliths overlie the pelvis. Right upper quadrant surgical clips.   IMPRESSION: Left nephrolithiasis.     Electronically Signed   By: Tish Frederickson M.D.   On: 01/23/2023 03:01     I have independently reviewed the films.  See HPI.    Assessment & Plan:    1. Right UVJ stone -Unknown at this time if they have been passed -She is currently asymptomatic at this time  2. Left pelvic stone -She is not experiencing any left-sided flank pain -She is asymptomatic at this time  We discussed options regarding where to go at this time.  Since she is currently asymptomatic, we will continue observation due to her age and other comorbidities.  I have sent the urine for culture.  We reviewed red flag signs.  If she should start developing flank pain or symptoms of infection, she is to contact our office immediately or seek treatment in the ED immediately   Return in about 1 month (around 02/22/2023) for UA, KUB and BMP.  These notes generated with voice recognition software. I apologize for typographical errors.  Cloretta Ned  [facility] [address]  [address] [place], [place] [address] [phone]

## 2023-01-22 ENCOUNTER — Encounter: Payer: Self-pay | Admitting: Urology

## 2023-01-22 ENCOUNTER — Ambulatory Visit (INDEPENDENT_AMBULATORY_CARE_PROVIDER_SITE_OTHER): Payer: Medicare Other | Admitting: Urology

## 2023-01-22 VITALS — BP 136/80 | HR 71 | Ht 65.0 in | Wt 150.0 lb

## 2023-01-22 DIAGNOSIS — N200 Calculus of kidney: Secondary | ICD-10-CM

## 2023-01-22 DIAGNOSIS — N201 Calculus of ureter: Secondary | ICD-10-CM

## 2023-01-22 LAB — URINALYSIS, COMPLETE
Bilirubin, UA: NEGATIVE
Glucose, UA: NEGATIVE
Ketones, UA: NEGATIVE
Nitrite, UA: NEGATIVE
Specific Gravity, UA: 1.02 (ref 1.005–1.030)
Urobilinogen, Ur: 1 mg/dL (ref 0.2–1.0)
pH, UA: 6 (ref 5.0–7.5)

## 2023-01-22 LAB — MICROSCOPIC EXAMINATION

## 2023-01-25 LAB — CULTURE, URINE COMPREHENSIVE

## 2023-02-11 ENCOUNTER — Other Ambulatory Visit: Payer: Self-pay | Admitting: Internal Medicine

## 2023-02-25 ENCOUNTER — Ambulatory Visit
Admission: RE | Admit: 2023-02-25 | Discharge: 2023-02-25 | Disposition: A | Discharge status: Home / Self Care | Payer: Medicare Other | Source: Ambulatory Visit | Attending: Urology | Admitting: Urology

## 2023-02-25 ENCOUNTER — Ambulatory Visit
Admission: RE | Admit: 2023-02-25 | Discharge: 2023-02-25 | Disposition: A | Discharge status: Home / Self Care | Payer: Medicare Other | Attending: Urology | Admitting: Urology

## 2023-02-25 DIAGNOSIS — R310 Gross hematuria: Secondary | ICD-10-CM | POA: Diagnosis present

## 2023-02-25 DIAGNOSIS — N3000 Acute cystitis without hematuria: Secondary | ICD-10-CM

## 2023-02-25 NOTE — Progress Notes (Signed)
02/26/2023 3:18 PM   Dominique Hall [birth year] [id number]  Referring provider: Karie Schwalbe, MD [address] [place],  [place] [address]  Urological history: 1. High risk hematuria -former smoker -CTU (11/2021) - no worrisome findings -cysto (11/2021) - NED  2. Nephrolithiasis -CTU (11/2021) - several stones in left lower pole   3. Renal cysts -CTU (11/2021) -  bilateral renal cysts, measuring up to 4.5 cm in the interpolar region of the left kidney  4. Cystocele -CTU (11/2021) - small cystocele  5. rUTI's -contributing factors of age, vaginal atrophy, cystocele -documented urine cultures over the last year  12/13/2022 MUF  05/25/2022 Klebsiella pneumoniae  03/13/2022 Klebsiella pneumoniae -vaginal estrogen cream    Chief Complaint  Patient presents with   Nephrolithiasis    HPI: Dominique Hall is a 88 y.o. female who presents today for follow up with her husband, Dominique Maduro.    CT urogram (12/2022) 3 mm distal right ureteral calculus below the level of the sacral ureter. Associated mild right hydroureteronephrosis.  12 mm calculus in the left renal pelvis, without frank hydronephrosis. Additional bilateral nonobstructing renal calculi measuring up to 7 mm.  After she completed her CT scan on December 25, 2022, we discussed the results and she attempted to have a trial of medical expulsive therapy for 3 mm distal stone.    At her visit on 01/03/2023, She presents to the office today stating that the right-sided pain has been constant and very bothersome and she is wanting now to move onto definitive stone treatment.  Urinalysis color is yellow slightly cloudy, specific gravity greater than 1.030, 2+ blood, pH 5.5, 3+ protein, trace leukocyte, 11-30 WBCs, greater than 30 RBCs, 0-10 epithelial cells, hyaline casts are present, mucus threads are present and moderate bacteria.  Urine culture grew out mixed urogenital flora.  At that time, we made  tentative plans to pursue ureteroscopy.  Her pain lessened and the repeated CT renal stone study on January 08, 2023 and it demonstrated resolution of the right hydronephrosis and migration of her right ureteral calculi to the distal right ureter.  As she found her pain out of this intense, she decided to go back to pursuing medical expulsive therapy and follow-up in 2 weeks with KUB.  At her visit on 01/22/2023, she reported she was feeling well.  She had a quick tinge on the right lower side that lasted only a second and a couple days ago.  She has not passed any fragments that she is aware of.  Patient denies any modifying or aggravating factors.  Patient denies any gross hematuria, dysuria or suprapubic/flank pain.  Patient denies any fevers, chills, nausea or vomiting.  UA yellow slightly cloudy, specific gravity 1.020, 2+ blood, pH 6.0, 3+ protein, 1+ leukocyte, 11-30 WBCs, 11-30 RBCs, granular casts are present and moderate bacteria.   KUB Left renal stone, no right ureteral stones noted.   Urine culture MUF.    She is feeling fine.  She has not passed any fragments.  Patient denies any modifying or aggravating factors.  Patient denies any gross hematuria, dysuria or suprapubic/flank pain.  Patient denies any fevers, chills, nausea or vomiting.    Her UA today is yellow slightly cloudy, specific gravity greater than 1.030, 1+ blood, pH 5.5, 3+ protein, 2 urobilinogen, 1+ leukocyte, greater than 30 WBCs, 11-30 RBCs, greater than 10 epithelial cells, hyaline casts are present, mucus threads are present and moderate bacteria.  KUB demonstrates a large left  renal stone  PMH: Past Medical History:  Diagnosis Date   Allergy    Arthritis    Chest pain    a. 07/2019 MV: No ischemia/infarct. Low risk; b. 04/2021 MV: No isch/scar. EF 60%. No significant cor Ca2+. Developed atrial tachycardia during study.   Diverticulosis    Esophageal dysmotility    Esophageal ring    GERD (gastroesophageal reflux  disease)    Hiatal hernia    HTN (hypertension)    IBS (irritable bowel syndrome)    Osteopenia    PAH (pulmonary artery hypertension)    a. 07/2019 Echo: EF 60-65%, impaired relaxation. Nl RV size/fxn. PASP . Mildly dil LA.   Palpitations    Pre-syncope    Renal disorder    Sleep disturbance     Surgical History: Past Surgical History:  Procedure Laterality Date   ABDOMINAL HYSTERECTOMY     APPENDECTOMY  1973   BREAST EXCISIONAL BIOPSY Right    CHOLECYSTECTOMY     ERCP N/A 11/22/2017   Procedure: ENDOSCOPIC RETROGRADE CHOLANGIOPANCREATOGRAPHY (ERCP);  Surgeon: Midge Minium, MD;  Location: [facility] ENDOSCOPY;  Service: Endoscopy;  Laterality: N/A;   ESOPHAGOGASTRODUODENOSCOPY  07-2006   with dilation   KIDNEY SURGERY  1970   lumpectomy  [phone]   benign right breast   SQUAMOUS CELL CARCINOMA EXCISION  6/12   Right leg--Dr Michela Pitcher   SQUAMOUS CELL CARCINOMA EXCISION  2012   right calf   TOTAL ABDOMINAL HYSTERECTOMY W/ BILATERAL SALPINGOOPHORECTOMY  1973    Home Medications:  Allergies as of 02/26/2023       Reactions   Macrobid [nitrofurantoin] Diarrhea   Penicillins    Has patient had a PCN reaction causing immediate rash, facial/tongue/throat swelling, SOB or lightheadedness with hypotension: Unknown Has patient had a PCN reaction causing severe rash involving mucus membranes or skin necrosis: Unknown Has patient had a PCN reaction that required hospitalization: Unknown Has patient had a PCN reaction occurring within the last 10 years: Unknown If all of the above answers are "NO", then may proceed with Cephalosporin use.   Rabeprazole Sodium    REACTION: constipated   Sulfonamide Derivatives    REACTION: unspecified   Cefuroxime    diarrhea        Medication List        Accurate as of February 26, 2023  3:18 PM. If you have any questions, ask your nurse or doctor.          ascorbic acid 500 MG tablet Commonly known as: VITAMIN C Take 500 mg by mouth daily.    celecoxib 200 MG capsule Commonly known as: CELEBREX TAKE 1 CAPSULE BY MOUTH EVERY DAY   clobetasol ointment 0.05 % Commonly known as: TEMOVATE SMARTSIG:1 Topical Daily   metoprolol tartrate 50 MG tablet Commonly known as: LOPRESSOR Take 1 tablet (50 mg total) by mouth 2 (two) times daily.   nitrofurantoin (macrocrystal-monohydrate) 100 MG capsule Commonly known as: MACROBID Take 1 capsule (100 mg total) by mouth at bedtime.   NON FORMULARY Immunity support gummies Takes 1 gummy daily.   NON FORMULARY Black elderberry gummy Takes 1 gummy qd.   omeprazole 20 MG capsule Commonly known as: PRILOSEC TAKE 1 CAPSULE(20 MG) BY MOUTH DAILY   Vitamin D-3 25 MCG (1000 UT) Caps Take 1 capsule by mouth daily.        Allergies:  Allergies  Allergen Reactions   Macrobid [Nitrofurantoin] Diarrhea   Penicillins     Has patient had a PCN reaction causing immediate  rash, facial/tongue/throat swelling, SOB or lightheadedness with hypotension: Unknown Has patient had a PCN reaction causing severe rash involving mucus membranes or skin necrosis: Unknown Has patient had a PCN reaction that required hospitalization: Unknown Has patient had a PCN reaction occurring within the last 10 years: Unknown If all of the above answers are "NO", then may proceed with Cephalosporin use.   Rabeprazole Sodium     REACTION: constipated   Sulfonamide Derivatives     REACTION: unspecified   Cefuroxime     diarrhea    Family History: Family History  Problem Relation Age of Onset   Heart attack Father    Stroke Mother    Melanoma Daughter     Social History:  reports that she has quit smoking. She has never been exposed to tobacco smoke. She has never used smokeless tobacco. She reports that she does not currently use alcohol after a past usage of about 1.0 standard drink of alcohol per week. She reports that she does not use drugs.  ROS: Pertinent ROS in HPI  Physical Exam: BP (!) 149/78    Pulse 69   Ht 5\' 5"  (1.651 m)   Wt 150 lb (68 kg)   BMI 24.96 kg/m   Constitutional:  Well nourished. Alert and oriented, No acute distress. HEENT: [place] AT, moist mucus membranes.  Trachea midline, no masses. Cardiovascular: No clubbing, cyanosis, or edema. Respiratory: Normal respiratory effort, no increased work of breathing. Neurologic: Grossly intact, no focal deficits, moving all 4 extremities. Psychiatric: Normal mood and affect.    Laboratory Data: Urinalysis See EPIC and HPI  I have reviewed the labs.   Pertinent Imaging: See HPI I have independently reviewed the films.  See HPI.  Radiologist interpretation still pending  Assessment & Plan:    1. Right UVJ stone -KUB not seen  -She is currently asymptomatic at this time  2. Left pelvic stone -She is not experiencing any left-sided flank pain -She is asymptomatic at this time -Reviewed return to clinic precautions and red flag signs -BMP pending  Return in about 3 months (around 05/28/2023) for KUB, UA and BMP.  These notes generated with voice recognition software. I apologize for typographical errors.  Cloretta Ned  [facility] [address]  [address] [place], [place] [address] [phone]

## 2023-02-26 ENCOUNTER — Encounter: Payer: Self-pay | Admitting: Urology

## 2023-02-26 ENCOUNTER — Ambulatory Visit (INDEPENDENT_AMBULATORY_CARE_PROVIDER_SITE_OTHER): Payer: Medicare Other | Admitting: Urology

## 2023-02-26 VITALS — BP 149/78 | HR 69 | Ht 65.0 in | Wt 150.0 lb

## 2023-02-26 DIAGNOSIS — R3129 Other microscopic hematuria: Secondary | ICD-10-CM

## 2023-02-26 DIAGNOSIS — N201 Calculus of ureter: Secondary | ICD-10-CM | POA: Diagnosis not present

## 2023-02-26 LAB — MICROSCOPIC EXAMINATION
Epithelial Cells (non renal): >10 /hpf — AB (ref 0–10)
WBC, UA: >30 /hpf — AB (ref 0–5)

## 2023-02-26 LAB — URINALYSIS, COMPLETE
Bilirubin, UA: NEGATIVE
Glucose, UA: NEGATIVE
Ketones, UA: NEGATIVE
Nitrite, UA: NEGATIVE
Specific Gravity, UA: >1.03 — ABNORMAL HIGH (ref 1.005–1.030)
Urobilinogen, Ur: 2 mg/dL — ABNORMAL HIGH (ref 0.2–1.0)
pH, UA: 5.5 (ref 5.0–7.5)

## 2023-02-27 ENCOUNTER — Telehealth: Payer: Self-pay | Admitting: Family Medicine

## 2023-02-27 LAB — BASIC METABOLIC PANEL
BUN/Creatinine Ratio: 38 — ABNORMAL HIGH (ref 12–28)
BUN: 28 mg/dL — ABNORMAL HIGH (ref 8–27)
CO2: 24 mmol/L (ref 20–29)
Calcium: 9.5 mg/dL (ref 8.7–10.3)
Chloride: 104 mmol/L (ref 96–106)
Creatinine, Ser: 0.73 mg/dL (ref 0.57–1.00)
Glucose: 130 mg/dL — ABNORMAL HIGH (ref 70–99)
Potassium: 3.9 mmol/L (ref 3.5–5.2)
Sodium: 143 mmol/L (ref 134–144)
eGFR: 81 mL/min/{1.73_m2} (ref >59)

## 2023-02-27 NOTE — Telephone Encounter (Signed)
LMOM for patient to return call.

## 2023-02-27 NOTE — Telephone Encounter (Signed)
-----   Message from Harle Battiest, PA-C sent at 02/27/2023  8:16 AM EDT ----- Please let Dominique Hall know that her kidney function was fine, but her labs show she may not be getting enough fluid.  I would encourage her to drink more water to stay hydrated.  I would start with making sure she drinks at least one 16 ounce bottle of water daily.

## 2023-02-27 NOTE — Telephone Encounter (Signed)
Mr Reber advised.

## 2023-03-02 LAB — CULTURE, URINE COMPREHENSIVE

## 2023-03-09 ENCOUNTER — Other Ambulatory Visit: Payer: Self-pay | Admitting: Cardiovascular Disease

## 2023-03-13 ENCOUNTER — Ambulatory Visit: Payer: Medicare Other | Admitting: Urology

## 2023-03-18 NOTE — Progress Notes (Unsigned)
03/19/2023 8:21 PM   Dominique Hall [birth year] [id number]  Referring provider: Karie Schwalbe, MD [address] [place],  [place] [address]  Urological history: 1. High risk hematuria -former smoker -CTU (11/2021) - no worrisome findings -cysto (11/2021) - NED  2. Nephrolithiasis -CTU (11/2021) - several stones in left lower pole   3. Renal cysts -CTU (11/2021) -  bilateral renal cysts, measuring up to 4.5 cm in the interpolar region of the left kidney  4. Cystocele -CTU (11/2021) - small cystocele  5. rUTI's -contributing factors of age, vaginal atrophy, cystocele -documented urine cultures over the last year  12/13/2022 MUF  05/25/2022 Klebsiella pneumoniae  03/13/2022 Klebsiella pneumoniae -vaginal estrogen cream    No chief complaint on file.   HPI: Dominique Hall is a 88 y.o. female who presents today for burning with urination and frequency with her husband, Dominique Hall.    CT urogram (12/2022) 3 mm distal right ureteral calculus below the level of the sacral ureter. Associated mild right hydroureteronephrosis.  12 mm calculus in the left renal pelvis, without frank hydronephrosis. Additional bilateral nonobstructing renal calculi measuring up to 7 mm.  After she completed her CT scan on December 25, 2022, we discussed the results and she attempted to have a trial of medical expulsive therapy for 3 mm distal stone.    At her visit on 01/03/2023, She presents to the office today stating that the right-sided pain has been constant and very bothersome and she is wanting now to move onto definitive stone treatment.  Urinalysis color is yellow slightly cloudy, specific gravity greater than 1.030, 2+ blood, pH 5.5, 3+ protein, trace leukocyte, 11-30 WBCs, greater than 30 RBCs, 0-10 epithelial cells, hyaline casts are present, mucus threads are present and moderate bacteria.  Urine culture grew out mixed urogenital flora.  At that time, we made tentative  plans to pursue ureteroscopy.  Her pain lessened and the repeated CT renal stone study on January 08, 2023 and it demonstrated resolution of the right hydronephrosis and migration of her right ureteral calculi to the distal right ureter.  As she found her pain out of this intense, she decided to go back to pursuing medical expulsive therapy and follow-up in 2 weeks with KUB.  At her visit on 01/22/2023, she reported she was feeling well.  She had a quick tinge on the right lower side that lasted only a second and a couple days ago.  She has not passed any fragments that she is aware of.  Patient denies any modifying or aggravating factors.  Patient denies any gross hematuria, dysuria or suprapubic/flank pain.  Patient denies any fevers, chills, nausea or vomiting.  UA yellow slightly cloudy, specific gravity 1.020, 2+ blood, pH 6.0, 3+ protein, 1+ leukocyte, 11-30 WBCs, 11-30 RBCs, granular casts are present and moderate bacteria.   KUB Left renal stone, no right ureteral stones noted.   Urine culture MUF.    At her visit on 02/26/2023, she was feeling fine.  She has not passed any fragments.  Patient denies any modifying or aggravating factors.  Patient denies any gross hematuria, dysuria or suprapubic/flank pain.  Patient denies any fevers, chills, nausea or vomiting.  Her UA today is yellow slightly cloudy, specific gravity greater than 1.030, 1+ blood, pH 5.5, 3+ protein, 2 urobilinogen, 1+ leukocyte, greater than 30 WBCs, 11-30 RBCs, greater than 10 epithelial cells, hyaline casts are present, mucus threads are present and moderate bacteria.  KUB demonstrates a large  left renal stone.  Urine culture MUF.    UA ***  PMH: Past Medical History:  Diagnosis Date   Allergy    Arthritis    Chest pain    a. 07/2019 MV: No ischemia/infarct. Low risk; b. 04/2021 MV: No isch/scar. EF 60%. No significant cor Ca2+. Developed atrial tachycardia during study.   Diverticulosis    Esophageal dysmotility     Esophageal ring    GERD (gastroesophageal reflux disease)    Hiatal hernia    HTN (hypertension)    IBS (irritable bowel syndrome)    Osteopenia    PAH (pulmonary artery hypertension) (HCC)    a. 07/2019 Echo: EF 60-65%, impaired relaxation. Nl RV size/fxn. PASP . Mildly dil LA.   Palpitations    Pre-syncope    Renal disorder    Sleep disturbance     Surgical History: Past Surgical History:  Procedure Laterality Date   ABDOMINAL HYSTERECTOMY     APPENDECTOMY  1973   BREAST EXCISIONAL BIOPSY Right    CHOLECYSTECTOMY     ERCP N/A 11/22/2017   Procedure: ENDOSCOPIC RETROGRADE CHOLANGIOPANCREATOGRAPHY (ERCP);  Surgeon: Midge Minium, MD;  Location: [facility] ENDOSCOPY;  Service: Endoscopy;  Laterality: N/A;   ESOPHAGOGASTRODUODENOSCOPY  07-2006   with dilation   KIDNEY SURGERY  1970   lumpectomy  [phone]   benign right breast   SQUAMOUS CELL CARCINOMA EXCISION  6/12   Right leg--Dr Michela Pitcher   SQUAMOUS CELL CARCINOMA EXCISION  2012   right calf   TOTAL ABDOMINAL HYSTERECTOMY W/ BILATERAL SALPINGOOPHORECTOMY  1973    Home Medications:  Allergies as of 03/19/2023       Reactions   Macrobid [nitrofurantoin] Diarrhea   Penicillins    Has patient had a PCN reaction causing immediate rash, facial/tongue/throat swelling, SOB or lightheadedness with hypotension: Unknown Has patient had a PCN reaction causing severe rash involving mucus membranes or skin necrosis: Unknown Has patient had a PCN reaction that required hospitalization: Unknown Has patient had a PCN reaction occurring within the last 10 years: Unknown If all of the above answers are "NO", then may proceed with Cephalosporin use.   Rabeprazole Sodium    REACTION: constipated   Sulfonamide Derivatives    REACTION: unspecified   Cefuroxime    diarrhea        Medication List        Accurate as of Mar 18, 2023  8:21 PM. If you have any questions, ask your nurse or doctor.          ascorbic acid 500 MG tablet Commonly  known as: VITAMIN C Take 500 mg by mouth daily.   celecoxib 200 MG capsule Commonly known as: CELEBREX TAKE 1 CAPSULE BY MOUTH EVERY DAY   clobetasol ointment 0.05 % Commonly known as: TEMOVATE SMARTSIG:1 Topical Daily   metoprolol tartrate 50 MG tablet Commonly known as: LOPRESSOR Take 1 tablet (50 mg total) by mouth 2 (two) times daily.   nitrofurantoin (macrocrystal-monohydrate) 100 MG capsule Commonly known as: MACROBID Take 1 capsule (100 mg total) by mouth at bedtime.   NON FORMULARY Immunity support gummies Takes 1 gummy daily.   NON FORMULARY Black elderberry gummy Takes 1 gummy qd.   omeprazole 20 MG capsule Commonly known as: PRILOSEC TAKE 1 CAPSULE(20 MG) BY MOUTH DAILY   Vitamin D-3 25 MCG (1000 UT) Caps Take 1 capsule by mouth daily.        Allergies:  Allergies  Allergen Reactions   Macrobid [Nitrofurantoin] Diarrhea   Penicillins  Has patient had a PCN reaction causing immediate rash, facial/tongue/throat swelling, SOB or lightheadedness with hypotension: Unknown Has patient had a PCN reaction causing severe rash involving mucus membranes or skin necrosis: Unknown Has patient had a PCN reaction that required hospitalization: Unknown Has patient had a PCN reaction occurring within the last 10 years: Unknown If all of the above answers are "NO", then may proceed with Cephalosporin use.   Rabeprazole Sodium     REACTION: constipated   Sulfonamide Derivatives     REACTION: unspecified   Cefuroxime     diarrhea    Family History: Family History  Problem Relation Age of Onset   Heart attack Father    Stroke Mother    Melanoma Daughter     Social History:  reports that she has quit smoking. She has never been exposed to tobacco smoke. She has never used smokeless tobacco. She reports that she does not currently use alcohol after a past usage of about 1.0 standard drink of alcohol per week. She reports that she does not use  drugs.  ROS: Pertinent ROS in HPI  Physical Exam: There were no vitals taken for this visit.  Constitutional:  Well nourished. Alert and oriented, No acute distress. HEENT: [place] AT, moist mucus membranes.  Trachea midline, no masses. Cardiovascular: No clubbing, cyanosis, or edema. Respiratory: Normal respiratory effort, no increased work of breathing. GU: No CVA tenderness.  No bladder fullness or masses. Vulvovaginal atrophy w/ pallor, loss of rugae, introital retraction, excoriations.  Vulvar thinning, fusion of labia, clitoral hood retraction, prominent urethral meatus.   *** external genitalia, *** pubic hair distribution, no lesions.  Normal urethral meatus, no lesions, no prolapse, no discharge.   No urethral masses, tenderness and/or tenderness. No bladder fullness, tenderness or masses. *** vagina mucosa, *** estrogen effect, no discharge, no lesions, *** pelvic support, *** cystocele and *** rectocele noted.  No cervical motion tenderness.  Uterus is freely mobile and non-fixed.  No adnexal/parametria masses or tenderness noted.  Anus and perineum are without rashes or lesions.   ***  Neurologic: Grossly intact, no focal deficits, moving all 4 extremities. Psychiatric: Normal mood and affect.    Laboratory Data: Urinalysis See EPIC and HPI  I have reviewed the labs.   Pertinent Imaging: N/A   Assessment & Plan:    1. Suspected UTI -UA -urine sent for culture  2. Right UVJ stone ***  3. Left pelvic stone ***  No follow-ups on file.  These notes generated with voice recognition software. I apologize for typographical errors.  Cloretta Ned  [facility] [address]  [address] [place], [place] [address] [phone]

## 2023-03-19 ENCOUNTER — Encounter: Payer: Self-pay | Admitting: Urology

## 2023-03-19 ENCOUNTER — Ambulatory Visit: Payer: Medicare Other | Admitting: Urology

## 2023-03-19 ENCOUNTER — Ambulatory Visit (INDEPENDENT_AMBULATORY_CARE_PROVIDER_SITE_OTHER): Payer: Medicare Other | Admitting: Urology

## 2023-03-19 VITALS — BP 119/71 | HR 67 | Ht 65.0 in | Wt 155.0 lb

## 2023-03-19 DIAGNOSIS — N898 Other specified noninflammatory disorders of vagina: Secondary | ICD-10-CM | POA: Diagnosis not present

## 2023-03-19 DIAGNOSIS — R82998 Other abnormal findings in urine: Secondary | ICD-10-CM | POA: Diagnosis not present

## 2023-03-19 DIAGNOSIS — Z87442 Personal history of urinary calculi: Secondary | ICD-10-CM

## 2023-03-19 DIAGNOSIS — R3989 Other symptoms and signs involving the genitourinary system: Secondary | ICD-10-CM

## 2023-03-19 DIAGNOSIS — N201 Calculus of ureter: Secondary | ICD-10-CM

## 2023-03-19 DIAGNOSIS — N952 Postmenopausal atrophic vaginitis: Secondary | ICD-10-CM

## 2023-03-19 LAB — URINALYSIS, COMPLETE
Bilirubin, UA: NEGATIVE
Glucose, UA: NEGATIVE
Ketones, UA: NEGATIVE
Nitrite, UA: NEGATIVE
Specific Gravity, UA: >1.03 — ABNORMAL HIGH (ref 1.005–1.030)
Urobilinogen, Ur: 1 mg/dL (ref 0.2–1.0)
pH, UA: 6 (ref 5.0–7.5)

## 2023-03-19 LAB — MICROSCOPIC EXAMINATION
Epithelial Cells (non renal): >10 /hpf — AB (ref 0–10)
RBC, Urine: >30 /hpf — AB (ref 0–2)

## 2023-03-19 NOTE — Patient Instructions (Signed)
Apply a pea-sized amount of the vaginal estrogen cream to the vaginal area every night for the next 2 weeks

## 2023-03-22 ENCOUNTER — Other Ambulatory Visit: Payer: Self-pay | Admitting: Urology

## 2023-03-22 DIAGNOSIS — N390 Urinary tract infection, site not specified: Secondary | ICD-10-CM

## 2023-03-22 LAB — CULTURE, URINE COMPREHENSIVE

## 2023-03-22 MED ORDER — CIPROFLOXACIN HCL 250 MG PO TABS: 250.0000 mg | ORAL_TABLET | Freq: Two times a day (BID) | ORAL | 0 refills | Status: DC

## 2023-04-02 NOTE — Progress Notes (Signed)
04/03/2023 10:47 PM   Dominique Hall 04/19/1935 [id number]  Referring provider: Karie Schwalbe, MD [address] [place],  [place] [address]  Urological history: 1. High risk hematuria -former smoker -CTU (11/2021) - no worrisome findings -cysto (11/2021) - NED  2. Nephrolithiasis -CTU (11/2021) - several stones in left lower pole   3. Renal cysts -CTU (11/2021) -  bilateral renal cysts, measuring up to 4.5 cm in the interpolar region of the left kidney  4. Cystocele -CTU (11/2021) - small cystocele  5. rUTI's -contributing factors of age, vaginal atrophy, cystocele -documented urine cultures over the last year  03/19/2023 E.coli   02/26/2023 MUF  01/22/2023 MUF  01/03/2023 MUF  12/24/2022 Multiple species   12/13/2022 MUF  05/25/2022 Klebsiella pneumoniae -vaginal estrogen cream    Chief Complaint  Patient presents with   Follow-up    HPI: Dominique Hall is a 88 y.o. female who presents today for 2 week follow up with her husband, Dominique Hall.    At her visit on 03/19/2023, she has been having intermittent episodes of burning in the vaginal area associated with itchiness.  The symptoms can last 2 to 3 days and then abate for a few days.  She denies any burning with urination.  She also denies any flank pain.   Patient denies any modifying or aggravating factors.  Patient denies any gross hematuria or suprapubic pain.  Patient denies any fevers, chills, nausea or vomiting.  Her husband states that he has noticed she has not been using the vaginal estrogen cream.  She does have the tube in her purse and it appears like a brand-new tube.  UA yellow slightly cloudy, specific gravity greater than 1.030, 1+ blood, pH 6.0, 3+ protein, 1.0 urobilinogen, trace leukocyte, 11-30 WBCs, greater than 30 RBCs, greater than 10 epithelial cells, hyaline cast present, mucus threads present, moderate bacteria.  Her husband also mention that she has some episodes where  she feels like she is going to faint.  She states that she has been having these episodes on and off for the last 50 years and they are wondering what type of specialist to see for further evaluation.  I recommended either seeing a neurologist or speaking with her PCP.  She was advised to apply the vaginal estrogen cream every night for two weeks.  Her urine culture was positive for E.coli and she was prescribed the appropriate antibiotics.    She is feeling good today.   She completed her antibiotic yesterday.  Patient denies any modifying or aggravating factors.  Patient denies any gross hematuria, dysuria or suprapubic/flank pain.  Patient denies any fevers, chills, nausea or vomiting.    UA yellow slightly cloudy, specific area greater than 1.030, 2+ blood, pH 5.5, 2+ protein, 1+ leukocyte, 11-30 WBCs, greater than 30 RBCs, greater than 10 epithelial cells, hyaline casts present, calcium oxalate crystals present,  mucus threads present and moderate bacteria.      PMH: Past Medical History:  Diagnosis Date   Allergy    Arthritis    Chest pain    a. 07/2019 MV: No ischemia/infarct. Low risk; b. 04/2021 MV: No isch/scar. EF 60%. No significant cor Ca2+. Developed atrial tachycardia during study.   Diverticulosis    Esophageal dysmotility    Esophageal ring    GERD (gastroesophageal reflux disease)    Hiatal hernia    HTN (hypertension)    IBS (irritable bowel syndrome)    Osteopenia    PAH (  pulmonary artery hypertension) (HCC)    a. 07/2019 Echo: EF 60-65%, impaired relaxation. Nl RV size/fxn. PASP . Mildly dil LA.   Palpitations    Pre-syncope    Renal disorder    Sleep disturbance     Surgical History: Past Surgical History:  Procedure Laterality Date   ABDOMINAL HYSTERECTOMY     APPENDECTOMY  1973   BREAST EXCISIONAL BIOPSY Right    CHOLECYSTECTOMY     ERCP N/A 11/22/2017   Procedure: ENDOSCOPIC RETROGRADE CHOLANGIOPANCREATOGRAPHY (ERCP);  Surgeon: Midge Minium, MD;   Location[facility] ENDOSCOPY;  Service: Endoscopy;  Laterality: N/A;   ESOPHAGOGASTRODUODENOSCOPY  07-2006   with dilation   KIDNEY SURGERY  1970   lumpectomy  [phone]   benign right breast   SQUAMOUS CELL CARCINOMA EXCISION  6/12   Right leg--Dr Michela Pitcher   SQUAMOUS CELL CARCINOMA EXCISION  2012   right calf   TOTAL ABDOMINAL HYSTERECTOMY W/ BILATERAL SALPINGOOPHORECTOMY  1973    Home Medications:  Allergies as of 04/03/2023       Reactions   Macrobid [nitrofurantoin] Diarrhea   Penicillins    Has patient had a PCN reaction causing immediate rash, facial/tongue/throat swelling, SOB or lightheadedness with hypotension: Unknown Has patient had a PCN reaction causing severe rash involving mucus membranes or skin necrosis: Unknown Has patient had a PCN reaction that required hospitalization: Unknown Has patient had a PCN reaction occurring within the last 10 years: Unknown If all of the above answers are "NO", then may proceed with Cephalosporin use.   Rabeprazole Sodium    REACTION: constipated   Sulfonamide Derivatives    REACTION: unspecified   Cefuroxime    diarrhea        Medication List        Accurate as of Apr 03, 2023 11:59 PM. If you have any questions, ask your nurse or doctor.          STOP taking these medications    ciprofloxacin 250 MG tablet Commonly known as: Cipro Stopped by: Kelli Egolf, PA-C   nitrofurantoin (macrocrystal-monohydrate) 100 MG capsule Commonly known as: MACROBID Stopped by: Michiel Cowboy, PA-C       TAKE these medications    ascorbic acid 500 MG tablet Commonly known as: VITAMIN C Take 500 mg by mouth daily.   celecoxib 200 MG capsule Commonly known as: CELEBREX TAKE 1 CAPSULE BY MOUTH EVERY DAY   clobetasol ointment 0.05 % Commonly known as: TEMOVATE SMARTSIG:1 Topical Daily   estradiol 0.1 MG/GM vaginal cream Commonly known as: ESTRACE 3 (three) times a week.   metoprolol tartrate 50 MG tablet Commonly known as:  LOPRESSOR Take 1 tablet (50 mg total) by mouth 2 (two) times daily.   NON FORMULARY Immunity support gummies Takes 1 gummy daily.   NON FORMULARY Black elderberry gummy Takes 1 gummy qd.   omeprazole 20 MG capsule Commonly known as: PRILOSEC TAKE 1 CAPSULE(20 MG) BY MOUTH DAILY   Vitamin D-3 25 MCG (1000 UT) Caps Take 1 capsule by mouth daily.        Allergies:  Allergies  Allergen Reactions   Macrobid [Nitrofurantoin] Diarrhea   Penicillins     Has patient had a PCN reaction causing immediate rash, facial/tongue/throat swelling, SOB or lightheadedness with hypotension: Unknown Has patient had a PCN reaction causing severe rash involving mucus membranes or skin necrosis: Unknown Has patient had a PCN reaction that required hospitalization: Unknown Has patient had a PCN reaction occurring within the last 10 years: Unknown If all  of the above answers are "NO", then may proceed with Cephalosporin use.   Rabeprazole Sodium     REACTION: constipated   Sulfonamide Derivatives     REACTION: unspecified   Cefuroxime     diarrhea    Family History: Family History  Problem Relation Age of Onset   Heart attack Father    Stroke Mother    Melanoma Daughter     Social History:  reports that she has quit smoking. She has never been exposed to tobacco smoke. She has never used smokeless tobacco. She reports that she does not currently use alcohol after a past usage of about 1.0 standard drink of alcohol per week. She reports that she does not use drugs.  ROS: Pertinent ROS in HPI  Physical Exam: BP 121/75 (BP Location: Left Arm, Patient Position: Sitting, Cuff Size: Normal)   Pulse 66   Ht 5\' 5"  (1.651 m)   Wt 155 lb (70.3 kg)   BMI 25.79 kg/m   Constitutional:  Well nourished. Alert and oriented, No acute distress. HEENT: [place] AT, moist mucus membranes.  Trachea midline Cardiovascular: No clubbing, cyanosis, or edema. Respiratory: Normal respiratory effort, no increased  work of breathing. Neurologic: Grossly intact, no focal deficits, moving all 4 extremities. Psychiatric: Normal mood and affect.    Laboratory Data: Urinalysis See EPIC and HPI  I have reviewed the labs.  Pertinent Imaging: N/A  Assessment & Plan:    1. GSM -continue vaginal estrogen cream three nights weekly  2. Right UVJ stone -no issues with flank pain -likely passed as not visualized on previous KUB  3. Left pelvic stone -no left flank pain  4. E.coli UTI -UA stable -urine sent for culture in case she develops symptoms of an UTI  Return for keep follow up in July .  These notes generated with voice recognition software. I apologize for typographical errors.  Cloretta Ned  [facility] [address]  [address] [place], [place] [address] [phone]

## 2023-04-03 ENCOUNTER — Ambulatory Visit (INDEPENDENT_AMBULATORY_CARE_PROVIDER_SITE_OTHER): Payer: Medicare Other | Admitting: Urology

## 2023-04-03 ENCOUNTER — Encounter: Payer: Self-pay | Admitting: Urology

## 2023-04-03 VITALS — BP 121/75 | HR 66 | Ht 65.0 in | Wt 155.0 lb

## 2023-04-03 DIAGNOSIS — N201 Calculus of ureter: Secondary | ICD-10-CM

## 2023-04-03 DIAGNOSIS — N952 Postmenopausal atrophic vaginitis: Secondary | ICD-10-CM

## 2023-04-03 DIAGNOSIS — N951 Menopausal and female climacteric states: Secondary | ICD-10-CM

## 2023-04-03 DIAGNOSIS — Z87440 Personal history of urinary (tract) infections: Secondary | ICD-10-CM

## 2023-04-03 DIAGNOSIS — N390 Urinary tract infection, site not specified: Secondary | ICD-10-CM

## 2023-04-03 LAB — URINALYSIS, COMPLETE
Bilirubin, UA: NEGATIVE
Glucose, UA: NEGATIVE
Ketones, UA: NEGATIVE
Nitrite, UA: NEGATIVE
Specific Gravity, UA: >1.03 — ABNORMAL HIGH (ref 1.005–1.030)
Urobilinogen, Ur: 0.2 mg/dL (ref 0.2–1.0)
pH, UA: 5.5 (ref 5.0–7.5)

## 2023-04-03 LAB — MICROSCOPIC EXAMINATION
Epithelial Cells (non renal): >10 /hpf — AB (ref 0–10)
RBC, Urine: >30 /hpf — AB (ref 0–2)

## 2023-04-04 ENCOUNTER — Telehealth: Payer: Self-pay | Admitting: Internal Medicine

## 2023-04-04 MED ORDER — CLORAZEPATE DIPOTASSIUM 3.75 MG PO TABS: 3.7500 mg | ORAL_TABLET | Freq: Two times a day (BID) | ORAL | 3 refills | Status: DC | PRN

## 2023-04-04 NOTE — Telephone Encounter (Signed)
I am concerned about how she is using this --it is important to only use it for anxiety and not for physical symptoms like dizziness (since it can make things worse). It can also be used at bedtime as needed  I did send a lower dose--to try to reduce the chance of side effects

## 2023-04-04 NOTE — Telephone Encounter (Signed)
Prescription Request  04/04/2023  LOV: 12/17/2022  What is the name of the medication or equipment?    Have you contacted your pharmacy to request a refill? No   Which pharmacy would you like this sent to?  [id number] - [place], [place] - [address] AT [facility] [address] [place] [address] Phone: [phone] Fax: [phone]    Patient notified that their request is being sent to the clinical staff for review and that they should receive a response within 2 business days.   Please advise at Mobile [phone] (mobile)

## 2023-04-04 NOTE — Telephone Encounter (Signed)
Left message on VM to call us back with an update on when she went back on the medication as it is not on her med list.

## 2023-04-04 NOTE — Telephone Encounter (Signed)
Patients husband returned call.He said that patient doesn't take the medication often,but she would like to have a back up of it when she needs it for her anxiety.

## 2023-04-04 NOTE — Telephone Encounter (Signed)
Spoke to pt. She said she has not taken it since January. She will make sure not to take it often.

## 2023-04-10 ENCOUNTER — Ambulatory Visit: Payer: Medicare Other | Admitting: Podiatry

## 2023-04-11 LAB — CULTURE, URINE COMPREHENSIVE

## 2023-05-28 ENCOUNTER — Ambulatory Visit: Payer: Medicare Other | Admitting: Urology

## 2023-05-29 ENCOUNTER — Ambulatory Visit: Payer: Medicare Other | Admitting: Podiatry

## 2023-05-29 ENCOUNTER — Encounter: Payer: Self-pay | Admitting: Podiatry

## 2023-05-29 DIAGNOSIS — B351 Tinea unguium: Secondary | ICD-10-CM

## 2023-05-29 DIAGNOSIS — M79676 Pain in unspecified toe(s): Secondary | ICD-10-CM

## 2023-05-29 NOTE — Progress Notes (Signed)
Subjective:  Patient ID: [name], female    DOB: 04/21/1935,  MRN: [id number] HPI Chief Complaint  Patient presents with   Debridement    Requesting toenail trim - husband concerned about spots on lower legs   New Patient (Initial Visit)    Est pt 06/2020    88 y.o. female presents with the above complaint.   ROS: Denies fever chills nausea vomit muscle aches pains calf pain back pain chest pain shortness of breath.  Past Medical History:  Diagnosis Date   Allergy    Arthritis    Chest pain    a. 07/2019 MV: No ischemia/infarct. Low risk; b. 04/2021 MV: No isch/scar. EF 60%. No significant cor Ca2+. Developed atrial tachycardia during study.   Diverticulosis    Esophageal dysmotility    Esophageal ring    GERD (gastroesophageal reflux disease)    Hiatal hernia    HTN (hypertension)    IBS (irritable bowel syndrome)    Osteopenia    PAH (pulmonary artery hypertension) (HCC)    a. 07/2019 Echo: EF 60-65%, impaired relaxation. Nl RV size/fxn. PASP . Mildly dil LA.   Palpitations    Pre-syncope    Renal disorder    Sleep disturbance    Past Surgical History:  Procedure Laterality Date   ABDOMINAL HYSTERECTOMY     APPENDECTOMY  1973   BREAST EXCISIONAL BIOPSY Right    CHOLECYSTECTOMY     ERCP N/A 11/22/2017   Procedure: ENDOSCOPIC RETROGRADE CHOLANGIOPANCREATOGRAPHY (ERCP);  Surgeon: Midge Minium, MD;  Location: [facility] ENDOSCOPY;  Service: Endoscopy;  Laterality: N/A;   ESOPHAGOGASTRODUODENOSCOPY  07-2006   with dilation   KIDNEY SURGERY  1970   lumpectomy  [phone]   benign right breast   SQUAMOUS CELL CARCINOMA EXCISION  6/12   Right leg--Dr Teresa Coombs CELL CARCINOMA EXCISION  2012   right calf   TOTAL ABDOMINAL HYSTERECTOMY W/ BILATERAL SALPINGOOPHORECTOMY  1973    Current Outpatient Medications:    celecoxib (CELEBREX) 200 MG capsule, TAKE 1 CAPSULE BY MOUTH EVERY DAY, Disp: 90 capsule, Rfl: 3   Cholecalciferol (VITAMIN D-3) 25 MCG (1000 UT)  CAPS, Take 1 capsule by mouth daily., Disp: , Rfl:    clobetasol ointment (TEMOVATE) 0.05 %, SMARTSIG:1 Topical Daily, Disp: , Rfl:    clorazepate (TRANXENE) 3.75 MG tablet, Take 1 tablet (3.75 mg total) by mouth 2 (two) times daily as needed for anxiety., Disp: 30 tablet, Rfl: 3   estradiol (ESTRACE) 0.1 MG/GM vaginal cream, 3 (three) times a week., Disp: , Rfl:    metoprolol tartrate (LOPRESSOR) 50 MG tablet, Take 1 tablet (50 mg total) by mouth 2 (two) times daily., Disp: 180 tablet, Rfl: 3   NON FORMULARY, Immunity support gummies Takes 1 gummy daily., Disp: , Rfl:    NON FORMULARY, Black elderberry gummy Takes 1 gummy qd., Disp: , Rfl:    omeprazole (PRILOSEC) 20 MG capsule, TAKE 1 CAPSULE(20 MG) BY MOUTH DAILY, Disp: 90 capsule, Rfl: 3   vitamin C (ASCORBIC ACID) 500 MG tablet, Take 500 mg by mouth daily., Disp: , Rfl:   Allergies  Allergen Reactions   Macrobid [Nitrofurantoin] Diarrhea   Penicillins     Has patient had a PCN reaction causing immediate rash, facial/tongue/throat swelling, SOB or lightheadedness with hypotension: Unknown Has patient had a PCN reaction causing severe rash involving mucus membranes or skin necrosis: Unknown Has patient had a PCN reaction that required hospitalization: Unknown Has patient had a PCN reaction occurring within the  last 10 years: Unknown If all of the above answers are "NO", then may proceed with Cephalosporin use.   Rabeprazole Sodium     REACTION: constipated   Sulfonamide Derivatives     REACTION: unspecified   Cefuroxime     diarrhea   Review of Systems Objective:  There were no vitals filed for this visit.  General: Well developed, nourished, in no acute distress, alert and oriented x3   Dermatological: Skin is warm, dry and supple bilateral. Nails x 10 long thick yellow dystrophic clinically mycotic remaining integument appears unremarkable at this time. There are no open sores, no preulcerative lesions, no rash or signs of  infection present.  Vascular: Dorsalis Pedis artery and Posterior Tibial artery pedal pulses are 2/4 bilateral with immedate capillary fill time. Pedal hair growth present. No varicosities and no lower extremity edema present bilateral.   Neruologic: Grossly intact via light touch bilateral. Vibratory intact via tuning fork bilateral. Protective threshold with Semmes Wienstein monofilament intact to all pedal sites bilateral. Patellar and Achilles deep tendon reflexes 2+ bilateral. No Babinski or clonus noted bilateral.   Musculoskeletal: No gross boney pedal deformities bilateral. No pain, crepitus, or limitation noted with foot and ankle range of motion bilateral. Muscular strength 5/5 in all groups tested bilateral.  Gait: Unassisted, Nonantalgic.    Radiographs:  None taken  Assessment & Plan:   Assessment: Pain limb secondary to onychomycosis  Plan: Debridement of toenails 1 through 5 bilateral     Kayn Haymore T. Dean, [place]

## 2023-06-27 ENCOUNTER — Encounter (INDEPENDENT_AMBULATORY_CARE_PROVIDER_SITE_OTHER): Payer: Self-pay

## 2023-07-30 ENCOUNTER — Other Ambulatory Visit: Payer: Self-pay | Admitting: Internal Medicine

## 2023-07-30 DIAGNOSIS — G4700 Insomnia, unspecified: Secondary | ICD-10-CM

## 2023-07-30 NOTE — Progress Notes (Signed)
07/31/2023 3:17 PM   Dominique Hall 1935/07/17 [id number]  Referring provider: Karie Schwalbe, MD [address] [place],  [place] [address]  Urological history: 1. High risk hematuria -former smoker -CTU (11/2021) - no worrisome findings -cysto (11/2021) - NED  2. Nephrolithiasis -CTU (11/2021) - several stones in left lower pole   3. Renal cysts -CTU (11/2021) -  bilateral renal cysts, measuring up to 4.5 cm in the interpolar region of the left kidney  4. Cystocele -CTU (11/2021) - small cystocele  5. rUTI's -contributing factors of age, vaginal atrophy, cystocele -documented urine cultures over the last year  03/19/2023 E.coli   02/26/2023 MUF  01/22/2023 MUF  01/03/2023 MUF  12/24/2022 Multiple species   12/13/2022 MUF -vaginal estrogen cream    Chief Complaint  Patient presents with   Follow-up    HPI: Dominique Hall is a 88 y.o. female who presents today for 2 week follow up with her husband, Dominique Hall.    Previous Records reviewed.   KUB stable stones  She had an episode of gross heme last week with a scant amount of blood and she feels like she passed some fragments with it.  Patient denies any modifying or aggravating factors.  Patient denies any recent UTI's, dysuria or suprapubic/flank pain.  Patient denies any fevers, chills, nausea or vomiting.    UA yellow cloudy, specific gravity 1.025, 2+ heme, pH 6.0, 2+ protein, 1+ leukocytes, greater than 30 WBCs, greater than 30 RBCs, greater than 10 epithelial cells, granular casts present, and Morphis sediment present, mucus threads present many bacteria.  PMH: Past Medical History:  Diagnosis Date   Allergy    Arthritis    Chest pain    a. 07/2019 MV: No ischemia/infarct. Low risk; b. 04/2021 MV: No isch/scar. EF 60%. No significant cor Ca2+. Developed atrial tachycardia during study.   Diverticulosis    Esophageal dysmotility    Esophageal ring    GERD (gastroesophageal reflux  disease)    Hiatal hernia    HTN (hypertension)    IBS (irritable bowel syndrome)    Osteopenia    PAH (pulmonary artery hypertension) (HCC)    a. 07/2019 Echo: EF 60-65%, impaired relaxation. Nl RV size/fxn. PASP . Mildly dil LA.   Palpitations    Pre-syncope    Renal disorder    Sleep disturbance     Surgical History: Past Surgical History:  Procedure Laterality Date   ABDOMINAL HYSTERECTOMY     APPENDECTOMY  1973   BREAST EXCISIONAL BIOPSY Right    CHOLECYSTECTOMY     ERCP N/A 11/22/2017   Procedure: ENDOSCOPIC RETROGRADE CHOLANGIOPANCREATOGRAPHY (ERCP);  Surgeon: Midge Minium, MD;  Location: [facility] ENDOSCOPY;  Service: Endoscopy;  Laterality: N/A;   ESOPHAGOGASTRODUODENOSCOPY  07-2006   with dilation   KIDNEY SURGERY  1970   lumpectomy  [phone]   benign right breast   SQUAMOUS CELL CARCINOMA EXCISION  6/12   Right leg--Dr Michela Pitcher   SQUAMOUS CELL CARCINOMA EXCISION  2012   right calf   TOTAL ABDOMINAL HYSTERECTOMY W/ BILATERAL SALPINGOOPHORECTOMY  1973    Home Medications:  Allergies as of 07/31/2023       Reactions   Macrobid [nitrofurantoin] Diarrhea   Penicillins    Has patient had a PCN reaction causing immediate rash, facial/tongue/throat swelling, SOB or lightheadedness with hypotension: Unknown Has patient had a PCN reaction causing severe rash involving mucus membranes or skin necrosis: Unknown Has patient had a PCN reaction that required hospitalization: Unknown  Has patient had a PCN reaction occurring within the last 10 years: Unknown If all of the above answers are "NO", then may proceed with Cephalosporin use.   Rabeprazole Sodium    REACTION: constipated   Sulfonamide Derivatives    REACTION: unspecified   Cefuroxime    diarrhea        Medication List        Accurate as of July 31, 2023  3:17 PM. If you have any questions, ask your nurse or doctor.          ascorbic acid 500 MG tablet Commonly known as: VITAMIN C Take 500 mg by mouth  daily.   celecoxib 200 MG capsule Commonly known as: CELEBREX TAKE 1 CAPSULE BY MOUTH EVERY DAY   clobetasol ointment 0.05 % Commonly known as: TEMOVATE APPLY TO VULVAR AREA DAILY AT BEDTIME FOR 4 WEEKS, EVERY OTHER DAY FOR 2 WEEKS, TWICE WEEKLY FOR 2 WEEKS, THEN STOP   clorazepate 3.75 MG tablet Commonly known as: TRANXENE Take 1 tablet (3.75 mg total) by mouth 2 (two) times daily as needed for anxiety.   estradiol 0.1 MG/GM vaginal cream Commonly known as: ESTRACE 3 (three) times a week.   metoprolol tartrate 50 MG tablet Commonly known as: LOPRESSOR Take 1 tablet (50 mg total) by mouth 2 (two) times daily.   NON FORMULARY Immunity support gummies Takes 1 gummy daily.   NON FORMULARY Black elderberry gummy Takes 1 gummy qd.   omeprazole 20 MG capsule Commonly known as: PRILOSEC TAKE 1 CAPSULE(20 MG) BY MOUTH DAILY   Vitamin D-3 25 MCG (1000 UT) Caps Take 1 capsule by mouth daily.        Allergies:  Allergies  Allergen Reactions   Macrobid [Nitrofurantoin] Diarrhea   Penicillins     Has patient had a PCN reaction causing immediate rash, facial/tongue/throat swelling, SOB or lightheadedness with hypotension: Unknown Has patient had a PCN reaction causing severe rash involving mucus membranes or skin necrosis: Unknown Has patient had a PCN reaction that required hospitalization: Unknown Has patient had a PCN reaction occurring within the last 10 years: Unknown If all of the above answers are "NO", then may proceed with Cephalosporin use.   Rabeprazole Sodium     REACTION: constipated   Sulfonamide Derivatives     REACTION: unspecified   Cefuroxime     diarrhea    Family History: Family History  Problem Relation Age of Onset   Heart attack Father    Stroke Mother    Melanoma Daughter     Social History:  reports that she has quit smoking. She has never been exposed to tobacco smoke. She has never used smokeless tobacco. She reports that she does not  currently use alcohol after a past usage of about 1.0 standard drink of alcohol per week. She reports that she does not use drugs.  ROS: Pertinent ROS in HPI  Physical Exam: BP 138/73   Pulse 71   Constitutional:  Well nourished. Alert and oriented, No acute distress. HEENT: [place] AT, moist mucus membranes.  Trachea midline Cardiovascular: No clubbing, cyanosis, or edema. Respiratory: Normal respiratory effort, no increased work of breathing. Neurologic: Grossly intact, no focal deficits, moving all 4 extremities. Psychiatric: Normal mood and affect.    Laboratory Data: Urinalysis See EPIC and HPI  I have reviewed the labs.  Pertinent Imaging: I have independently reviewed the films.  See HPI.  Radiologist interpretation still pending.   Assessment & Plan:    1. GSM -  continue vaginal estrogen cream three nights weekly  2. Right UVJ stone -no issues with flank pain -likely passed as not visualized on previous KUB  3. Left pelvic stone -no left flank pain  4. Gross hematuria -She had an episode of gross hematuria which may have been associated with passage of stone, but her micro heme has been worsening on her last few urinalysis and it has been over a year and a half since we last looked into her bladder, so I will go ahead and get her scheduled with Dr. Apolinar Junes for repeat cystoscopy to rule out anything that may be more concerning  Return for November for cysto for worsening micro heme .  These notes generated with voice recognition software. I apologize for typographical errors.  Cloretta Ned  [facility] [address]  [address] [place], [place] [address] [phone]

## 2023-07-31 ENCOUNTER — Ambulatory Visit (INDEPENDENT_AMBULATORY_CARE_PROVIDER_SITE_OTHER): Payer: Medicare Other | Admitting: Urology

## 2023-07-31 ENCOUNTER — Ambulatory Visit
Admission: RE | Admit: 2023-07-31 | Discharge: 2023-07-31 | Disposition: A | Discharge status: Home / Self Care | Payer: Medicare Other | Source: Ambulatory Visit | Attending: Urology | Admitting: Urology

## 2023-07-31 ENCOUNTER — Ambulatory Visit: Payer: Medicare Other | Admitting: Urology

## 2023-07-31 VITALS — BP 138/73 | HR 71

## 2023-07-31 DIAGNOSIS — N952 Postmenopausal atrophic vaginitis: Secondary | ICD-10-CM

## 2023-07-31 DIAGNOSIS — R310 Gross hematuria: Secondary | ICD-10-CM | POA: Diagnosis not present

## 2023-07-31 DIAGNOSIS — N200 Calculus of kidney: Secondary | ICD-10-CM | POA: Diagnosis not present

## 2023-07-31 DIAGNOSIS — N201 Calculus of ureter: Secondary | ICD-10-CM

## 2023-07-31 LAB — URINALYSIS, COMPLETE
Bilirubin, UA: NEGATIVE
Glucose, UA: NEGATIVE
Ketones, UA: NEGATIVE
Nitrite, UA: NEGATIVE
Specific Gravity, UA: 1.025 (ref 1.005–1.030)
Urobilinogen, Ur: 1 mg/dL (ref 0.2–1.0)
pH, UA: 6 (ref 5.0–7.5)

## 2023-07-31 LAB — MICROSCOPIC EXAMINATION
Epithelial Cells (non renal): >10 /HPF — AB (ref 0–10)
RBC, Urine: >30 /HPF — AB (ref 0–2)
WBC, UA: >30 /HPF — AB (ref 0–5)

## 2023-08-01 ENCOUNTER — Encounter: Payer: Self-pay | Admitting: Internal Medicine

## 2023-08-01 ENCOUNTER — Ambulatory Visit (INDEPENDENT_AMBULATORY_CARE_PROVIDER_SITE_OTHER): Payer: 59 | Admitting: Internal Medicine

## 2023-08-01 VITALS — BP 114/80 | HR 60 | Temp 97.7°F | Ht 64.0 in | Wt 147.0 lb

## 2023-08-01 DIAGNOSIS — Z0000 Encounter for general adult medical examination without abnormal findings: Secondary | ICD-10-CM

## 2023-08-01 DIAGNOSIS — N952 Postmenopausal atrophic vaginitis: Secondary | ICD-10-CM | POA: Diagnosis not present

## 2023-08-01 DIAGNOSIS — K219 Gastro-esophageal reflux disease without esophagitis: Secondary | ICD-10-CM

## 2023-08-01 DIAGNOSIS — I471 Supraventricular tachycardia, unspecified: Secondary | ICD-10-CM | POA: Diagnosis not present

## 2023-08-01 DIAGNOSIS — I10 Essential (primary) hypertension: Secondary | ICD-10-CM | POA: Diagnosis not present

## 2023-08-01 DIAGNOSIS — Z23 Encounter for immunization: Secondary | ICD-10-CM

## 2023-08-01 LAB — BASIC METABOLIC PANEL
BUN/Creatinine Ratio: 26 (ref 12–28)
BUN: 22 mg/dL (ref 8–27)
CO2: 24 mmol/L (ref 20–29)
Calcium: 9.4 mg/dL (ref 8.7–10.3)
Chloride: 103 mmol/L (ref 96–106)
Creatinine, Ser: 0.84 mg/dL (ref 0.57–1.00)
Glucose: 95 mg/dL (ref 70–99)
Potassium: 3.9 mmol/L (ref 3.5–5.2)
Sodium: 142 mmol/L (ref 134–144)
eGFR: 68 mL/min/{1.73_m2} (ref >59)

## 2023-08-01 MED ORDER — CLORAZEPATE DIPOTASSIUM 3.75 MG PO TABS: 3.7500 mg | ORAL_TABLET | Freq: Two times a day (BID) | ORAL | 3 refills | Status: DC | PRN

## 2023-08-01 NOTE — Progress Notes (Signed)
Subjective:    Patient ID: Dominique Hall, female    DOB: April 30, 1935, 88 y.o.   MRN: [id number]  HPI Here with husband for Medicare wellness visit and follow up of chronic health conditions Reviewed advanced directives Reviewed other doctors---Dr Hyatt--podiatry, Dr Marny Lowenstein McGowan---urology, Dr Gollan--cardiology, Dr Lissa Hoard, [facility] No hospitalizations or surgery in the past year Has started with pedal machine Has hearing aides---don't help that great (despite adjustments) Vision is good Daily wine No tobacco No falls No depression or anhedonia Independent with instrumental ADLs Mild memory issues---nothing serious  Still going through trouble with hematuria Seeing urology Stones still   No heart problems No chest pain or SOB No dizziness or syncope No edema No headache   No sig joint or back pain as long as she takes the celebrex  No heartburn or dysphagia Still on omeprazole daily  Current Outpatient Medications on File Prior to Visit  Medication Sig Dispense Refill   celecoxib (CELEBREX) 200 MG capsule TAKE 1 CAPSULE BY MOUTH EVERY DAY 90 capsule 3   Cholecalciferol (VITAMIN D-3) 25 MCG (1000 UT) CAPS Take 1 capsule by mouth daily.     clobetasol ointment (TEMOVATE) 0.05 % APPLY TO VULVAR AREA DAILY AT BEDTIME FOR 4 WEEKS, EVERY OTHER DAY FOR 2 WEEKS, TWICE WEEKLY FOR 2 WEEKS, THEN STOP 30 g 0   clorazepate (TRANXENE) 3.75 MG tablet Take 1 tablet (3.75 mg total) by mouth 2 (two) times daily as needed for anxiety. 30 tablet 3   estradiol (ESTRACE) 0.1 MG/GM vaginal cream 3 (three) times a week.     metoprolol tartrate (LOPRESSOR) 50 MG tablet Take 1 tablet (50 mg total) by mouth 2 (two) times daily. 180 tablet 3   NON FORMULARY Immunity support gummies Takes 1 gummy daily.     NON FORMULARY Black elderberry gummy Takes 1 gummy qd.     omeprazole (PRILOSEC) 20 MG capsule TAKE 1 CAPSULE(20 MG) BY MOUTH DAILY 90 capsule 3   vitamin C  (ASCORBIC ACID) 500 MG tablet Take 500 mg by mouth daily.     No current facility-administered medications on file prior to visit.    Allergies  Allergen Reactions   Macrobid [Nitrofurantoin] Diarrhea   Penicillins     Has patient had a PCN reaction causing immediate rash, facial/tongue/throat swelling, SOB or lightheadedness with hypotension: Unknown Has patient had a PCN reaction causing severe rash involving mucus membranes or skin necrosis: Unknown Has patient had a PCN reaction that required hospitalization: Unknown Has patient had a PCN reaction occurring within the last 10 years: Unknown If all of the above answers are "NO", then may proceed with Cephalosporin use.   Rabeprazole Sodium     REACTION: constipated   Sulfonamide Derivatives     REACTION: unspecified   Cefuroxime     diarrhea    Past Medical History:  Diagnosis Date   Allergy    Arthritis    Chest pain    a. 07/2019 MV: No ischemia/infarct. Low risk; b. 04/2021 MV: No isch/scar. EF 60%. No significant cor Ca2+. Developed atrial tachycardia during study.   Diverticulosis    Esophageal dysmotility    Esophageal ring    GERD (gastroesophageal reflux disease)    Hiatal hernia    HTN (hypertension)    IBS (irritable bowel syndrome)    Osteopenia    PAH (pulmonary artery hypertension) (HCC)    a. 07/2019 Echo: EF 60-65%, impaired relaxation. Nl RV size/fxn. PASP . Mildly dil LA.  Palpitations    Pre-syncope    Renal disorder    Sleep disturbance     Past Surgical History:  Procedure Laterality Date   ABDOMINAL HYSTERECTOMY     APPENDECTOMY  1973   BREAST EXCISIONAL BIOPSY Right    CHOLECYSTECTOMY     ERCP N/A 11/22/2017   Procedure: ENDOSCOPIC RETROGRADE CHOLANGIOPANCREATOGRAPHY (ERCP);  Surgeon: Midge Minium, MD;  Location: [facility] ENDOSCOPY;  Service: Endoscopy;  Laterality: N/A;   ESOPHAGOGASTRODUODENOSCOPY  07-2006   with dilation   KIDNEY SURGERY  1970   lumpectomy  [id number]   benign right breast    SQUAMOUS CELL CARCINOMA EXCISION  6/12   Right leg--Dr Teresa Coombs CELL CARCINOMA EXCISION  2012   right calf   TOTAL ABDOMINAL HYSTERECTOMY W/ BILATERAL SALPINGOOPHORECTOMY  1973    Family History  Problem Relation Age of Onset   Heart attack Father    Stroke Mother    Melanoma Daughter     Social History   Socioeconomic History   Marital status: Married    Spouse name: Not on file   Number of children: 2   Years of education: Not on file   Highest education level: Not on file  Occupational History   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  Tobacco Use   Smoking status: Former    Passive exposure: Never   Smokeless tobacco: Never  Vaping Use   Vaping status: Never Used  Substance and Sexual Activity   Alcohol use: Not Currently    Alcohol/week: 1.0 standard drink of alcohol    Types: 1 Glasses of wine per week    Comment: occ   Drug use: No   Sexual activity: Not on file  Other Topics Concern   Not on file  Social History Narrative   Has living will   Husband is [employer] health care POA---alternate is daughters Lynne/Amanda   Would accept resuscitation but no prolonged ventilation   Probably would not want tube feedings if cognitively unaware   Social Determinants of Health   Financial Resource Strain: Low Risk  (07/25/2021)   Overall Financial Resource Strain (CARDIA)    Difficulty of Paying Living Expenses: Not very hard  Food Insecurity: Not on file  Transportation Needs: Not on file  Physical Activity: Not on file  Stress: Not on file  Social Connections: Not on file  Intimate Partner Violence: Not on file   Review of Systems Appetite is good Weight down slightly Sleeps well Wears seat belt Teeth are okay---keeps up with dentist Bowels move fine--no blood. No suspicious skin lesions  No recent derm visits     Objective:   Physical Exam Constitutional:      Appearance: Normal appearance.  HENT:     Mouth/Throat:     Pharynx: No oropharyngeal exudate or  posterior oropharyngeal erythema.  Eyes:     Conjunctiva/sclera: Conjunctivae normal.     Pupils: Pupils are equal, round, and reactive to light.  Cardiovascular:     Rate and Rhythm: Normal rate and regular rhythm.     Pulses: Normal pulses.     Heart sounds: No murmur heard.    No gallop.  Pulmonary:     Effort: Pulmonary effort is normal.     Breath sounds: Normal breath sounds. No wheezing or rales.  Abdominal:     Palpations: Abdomen is soft.     Tenderness: There is no abdominal tenderness.  Musculoskeletal:     Cervical back: Neck supple.     Right lower leg:  No edema.     Left lower leg: No edema.  Lymphadenopathy:     Cervical: No cervical adenopathy.  Skin:    Findings: No rash.  Neurological:     General: No focal deficit present.     Mental Status: She is alert and oriented to person, place, and time.     Comments: Word naming--2/1 minute and froze Recall-- 3/3 (daisy with hint)  Psychiatric:        Mood and Affect: Mood normal.        Behavior: Behavior normal.            Assessment & Plan:

## 2023-08-01 NOTE — Assessment & Plan Note (Signed)
On vaginal estrogen.

## 2023-08-01 NOTE — Progress Notes (Signed)
Hearing Screening - Comments:: Wearing hearing aids Vision Screening - Comments:: March 2024

## 2023-08-01 NOTE — Addendum Note (Signed)
Addended by: Eual Fines on: 08/01/2023 04:34 PM   Modules accepted: Orders

## 2023-08-01 NOTE — Addendum Note (Signed)
Addended by: Tillman Abide I on: 08/01/2023 04:02 PM   Modules accepted: Orders

## 2023-08-01 NOTE — Assessment & Plan Note (Signed)
BP Readings from Last 3 Encounters:  08/01/23 114/80  07/31/23 138/73  04/03/23 121/75   Doing well on metoprolol 50 bid

## 2023-08-01 NOTE — Assessment & Plan Note (Signed)
I have personally reviewed the Medicare Annual Wellness questionnaire and have noted 1. The patient's medical and social history 2. Their use of alcohol, tobacco or illicit drugs 3. Their current medications and supplements 4. The patient's functional ability including ADL's, fall risks, home safety risks and hearing or visual             impairment. 5. Diet and physical activities 6. Evidence for depression or mood disorders  The patients weight, height, BMI and visual acuity have been recorded in the chart I have made referrals, counseling and provided education to the patient based review of the above and I have provided the pt with a written personalized care plan for preventive services.  I have provided you with a copy of your personalized plan for preventive services. Please take the time to review along with your updated medication list.  No cancer screening due to age Starting to do some exercise Flu vaccine today Td, COVID and RSV vaccines at the pharmacy

## 2023-08-01 NOTE — Assessment & Plan Note (Signed)
No symptoms of recurrence on the metoprolol

## 2023-08-01 NOTE — Assessment & Plan Note (Signed)
Quiet on the omeprazole 20mg  daily

## 2023-08-04 ENCOUNTER — Encounter: Payer: Self-pay | Admitting: Urology

## 2023-08-27 ENCOUNTER — Telehealth: Payer: Self-pay | Admitting: Urology

## 2023-08-27 NOTE — Telephone Encounter (Signed)
Per LOV with [name] patient is to follow up for Cysto in November for Cysto. Nothing else at this time. Dominique Hall advised

## 2023-08-27 NOTE — Telephone Encounter (Signed)
Patient's husband called and was a little confused with patient's appointments. She is scheduled for cysto on 09/18/23 with Dr. Apolinar Junes. She had an appointment with [name] on 09/03/23, that patient cancelled. Does she need to see [name] for follow up with [facility] prior to Cysto with Dr. Apolinar Junes. Please call patient and advise.

## 2023-09-03 ENCOUNTER — Ambulatory Visit: Payer: Medicare Other | Admitting: Urology

## 2023-09-18 ENCOUNTER — Ambulatory Visit: Payer: Medicare Other | Admitting: Urology

## 2023-09-18 ENCOUNTER — Other Ambulatory Visit: Payer: Self-pay

## 2023-09-18 ENCOUNTER — Encounter: Payer: Self-pay | Admitting: Urology

## 2023-09-18 VITALS — BP 139/81 | HR 67 | Ht 64.0 in | Wt 138.0 lb

## 2023-09-18 DIAGNOSIS — N200 Calculus of kidney: Secondary | ICD-10-CM

## 2023-09-18 DIAGNOSIS — R3129 Other microscopic hematuria: Secondary | ICD-10-CM

## 2023-09-18 DIAGNOSIS — N952 Postmenopausal atrophic vaginitis: Secondary | ICD-10-CM

## 2023-09-18 LAB — URINALYSIS, COMPLETE
Bilirubin, UA: NEGATIVE
Glucose, UA: NEGATIVE
Nitrite, UA: NEGATIVE
Specific Gravity, UA: >1.03 — ABNORMAL HIGH (ref 1.005–1.030)
Urobilinogen, Ur: 1 mg/dL (ref 0.2–1.0)
pH, UA: 5.5 (ref 5.0–7.5)

## 2023-09-18 LAB — MICROSCOPIC EXAMINATION: RBC, Urine: >30 /[HPF] — AB (ref 0–2)

## 2023-09-18 MED ORDER — ESTRADIOL 0.1 MG/GM VA CREA: TOPICAL_CREAM | VAGINAL | 11 refills | Status: DC

## 2023-09-18 NOTE — Addendum Note (Signed)
Addended by: Consuella Lose on: 09/18/2023 03:19 PM   Modules accepted: Orders

## 2023-09-18 NOTE — Progress Notes (Signed)
   09/18/23  CC:  Chief Complaint  Patient presents with   Cysto    HPI: 88 yo F with recurrent UTI, gross hematuria and kidney stones who presents today for cysto.  See previous notes for details.    Blood pressure 139/81, pulse 67, height 5\' 4"  (1.626 m), weight 138 lb (62.6 kg). NED. A&Ox3.   No respiratory distress   Abd soft, NT, ND Normal external genitalia with patent urethral meatus  Cystoscopy Procedure Note  Patient identification was confirmed, informed consent was obtained, and patient was prepped using Betadine solution.  Lidocaine jelly was administered per urethral meatus.    Procedure: - Flexible cystoscope introduced, without any difficulty.   - Thorough search of the bladder revealed:    normal urethral meatus    normal urothelium    no stones    no ulcers     no tumors    no urethral polyps    no trabeculation    Mildly clouding urine  - Ureteral orifices were normal in position and appearance.  Post-Procedure: - Patient tolerated the procedure well  Assessment/ Plan:  1. Microscopic hematuria Cysto today in unremarkable  May be related to stones - Urinalysis, Complete  2. Kidney stones 12 mm renal pelvic stone, asymptomatic  Will continue to monitor  3. Vaginal atrophy/ recurrent UTI Continue topical estrogen cream    1 year with KUB/ [facility]A  Vanna Scotland, MD

## 2023-09-20 IMAGING — CT CT ABD-PEL WO/W CM
2 of 12 series · 9 of 46 positions shown, 15 images · IV contrast (agent unspecified)
Comparison: Noncontrast abdominal pelvis CT 05/17/2021.
abdominopelvic CT with contrast 11/21/2017.

CLINICAL DATA: Gross hematuria for 1 month. History of kidney
stones with lithotripsy. Prior cholecystectomy, appendectomy and
hysterectomy.

EXAM:
CT ABDOMEN AND PELVIS WITHOUT AND WITH CONTRAST
TECHNIQUE: Multidetector CT imaging of the abdomen and pelvis was performed
following the standard protocol before and following the bolus
administration of intravenous contrast.

[Series 17: axial delay delay prone 5.00 · axial · delayed · 0.66mm/px · z∈[-1425,-1105]mm · 7 of 86 slices shown, 12 images]
[im 11/86  soft-tissue]
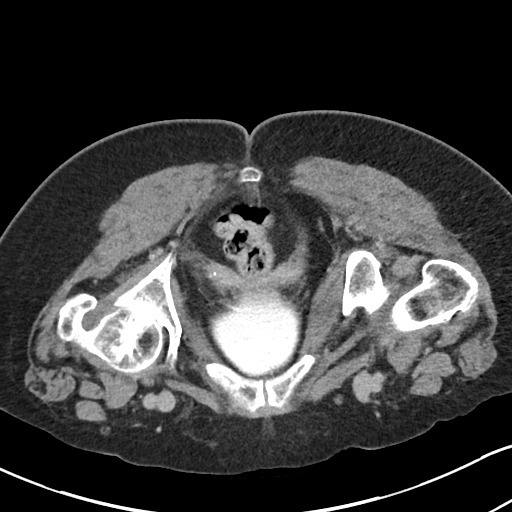
[im 11/86  bone]
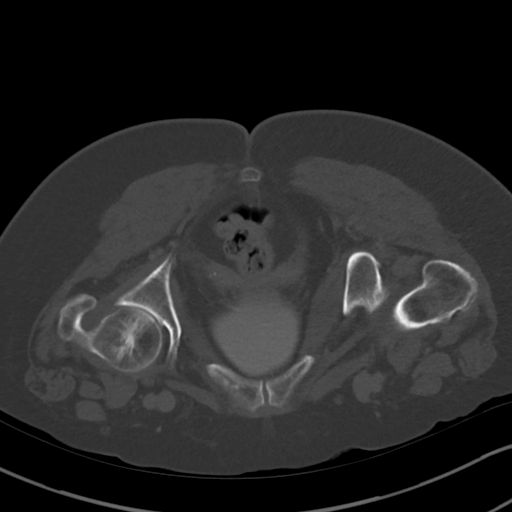
[im 22/86  soft-tissue]
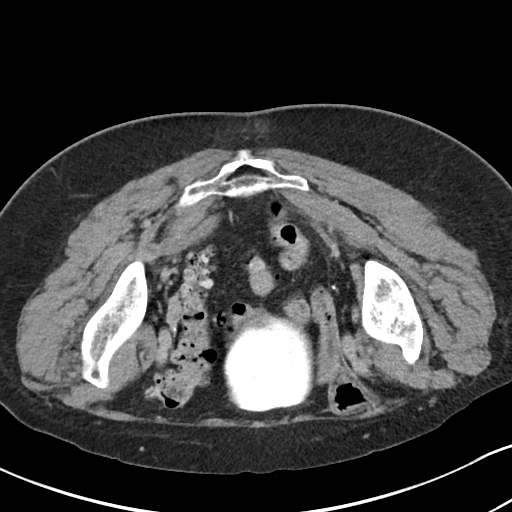
[im 32/86  soft-tissue]
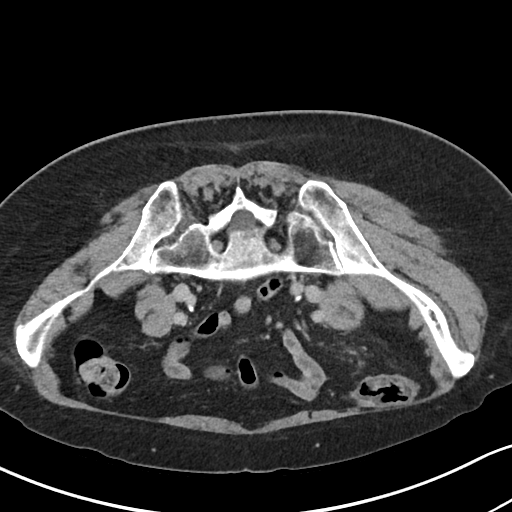
[im 43/86  soft-tissue]
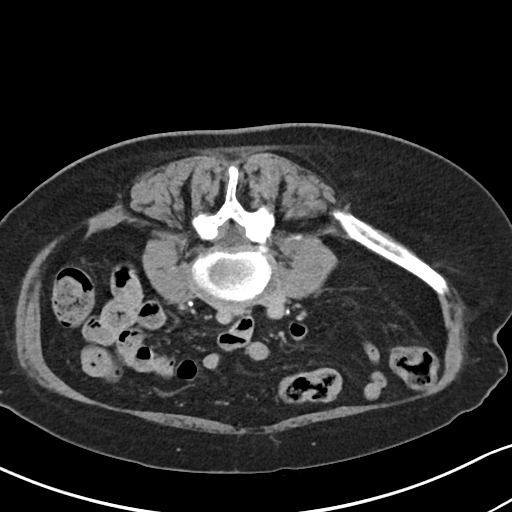
[im 43/86  lung]
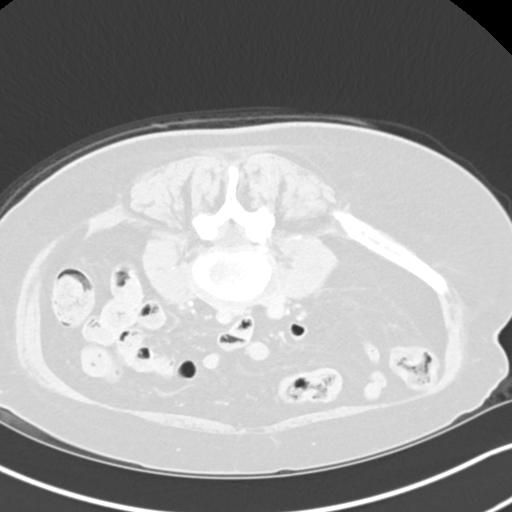
[im 54/86  soft-tissue]
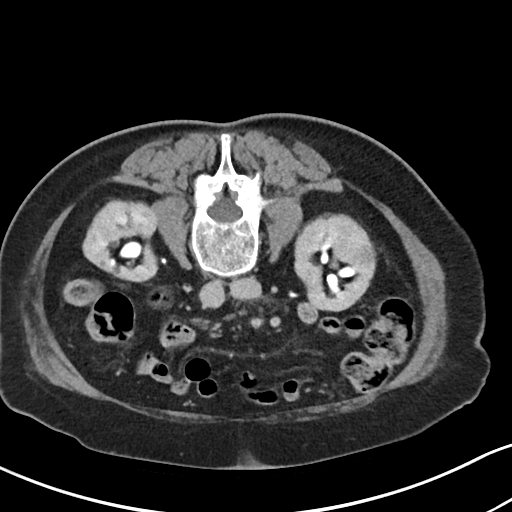
[im 54/86  lung]
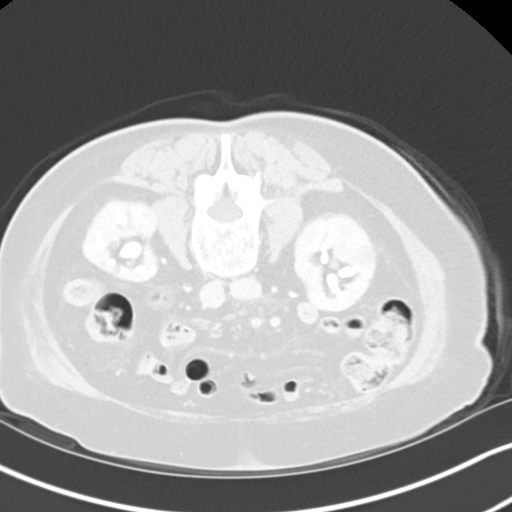
[im 64/86  soft-tissue]
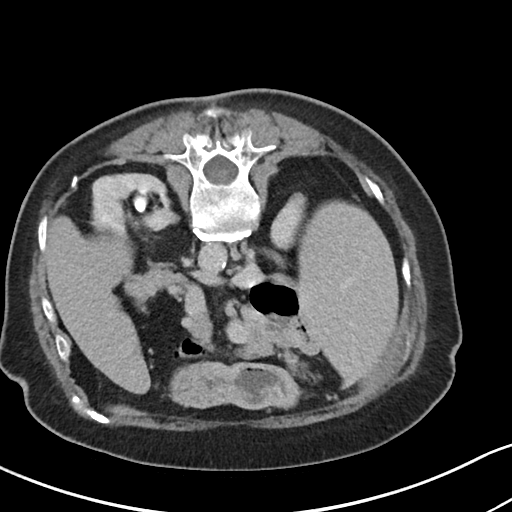
[im 64/86  lung]
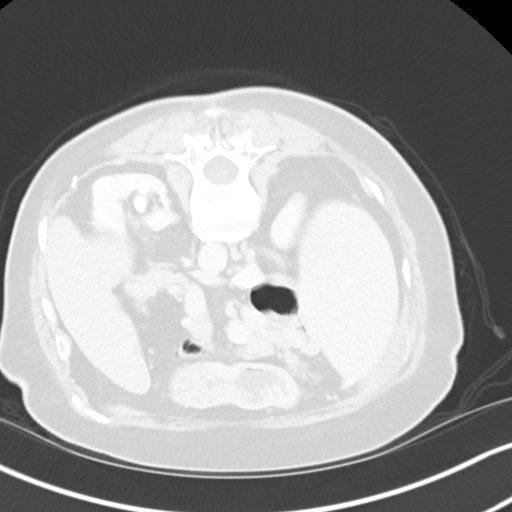
[im 75/86  soft-tissue]
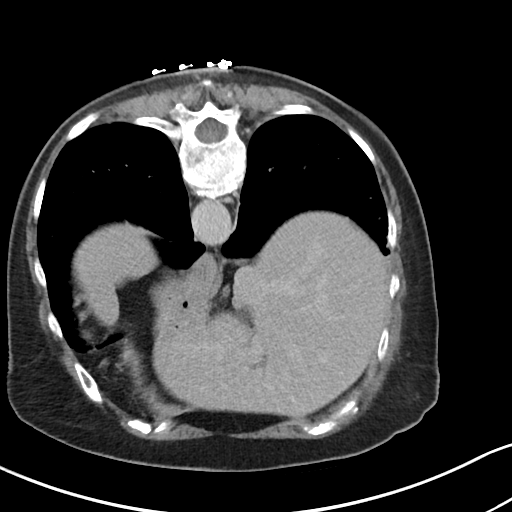
[im 75/86  lung]
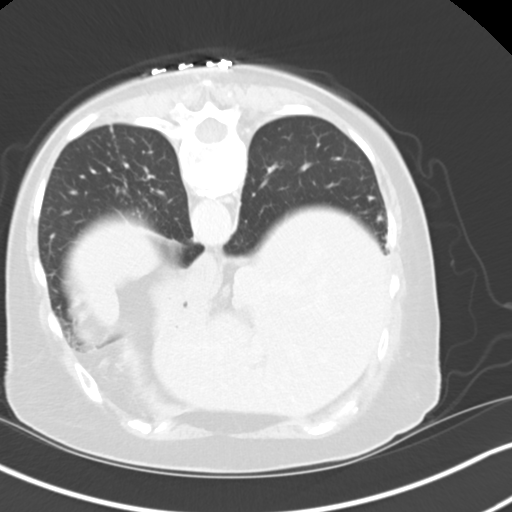

[Series 20: cor delay delay prone 2.00 cor · coronal · delayed · 0.66mm/px · 2 of 147 slices shown, 3 images]
[im 49/147  soft-tissue]
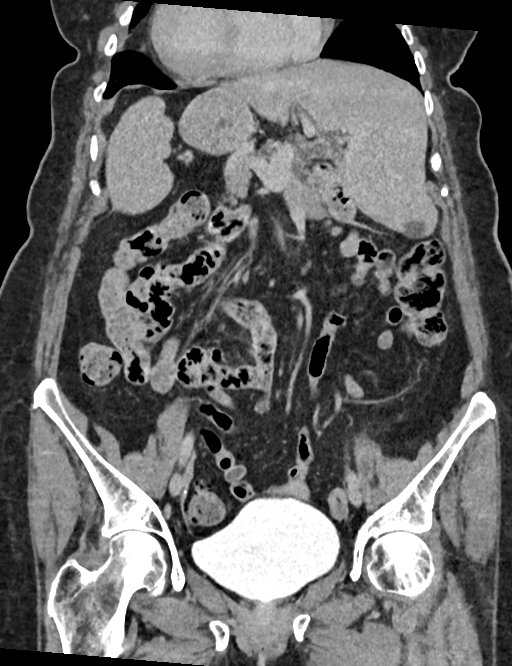
[im 49/147  bone]
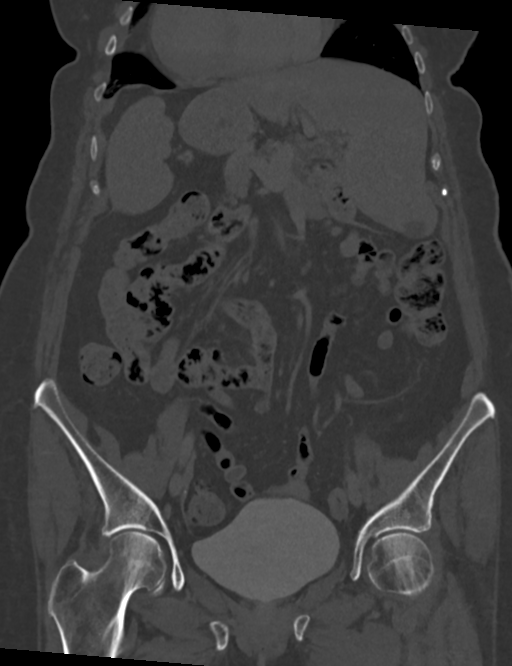
[im 98/147  soft-tissue]
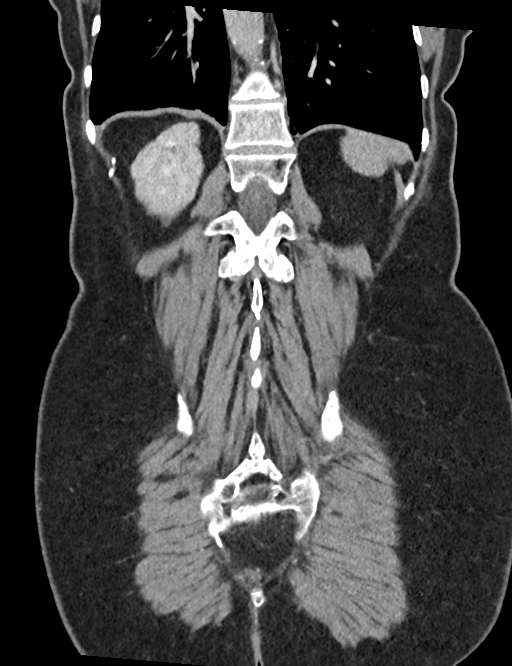

[9 of 46 positions shown; findings below may reference images not displayed]

RADIATION DOSE REDUCTION: This exam was performed according to the
departmental dose-optimization program which includes automated
exposure control, adjustment of the mA and/or kV according to
patient size and/or use of iterative reconstruction technique.

CONTRAST:  100mL OMNIPAQUE IOHEXOL 350 MG/ML SOLN
FINDINGS: Lower chest: There are chronic reticulonodular densities at both
lung bases which have mildly progressed, most likely due to a
chronic inflammatory process such as mycobacterium avium
intracellulare. No significant pleural or pericardial effusion.

Hepatobiliary: There are scattered hepatic cysts measuring up to
cm inferiorly in the right lobe on image [DATE]. Several areas of
nodular arterial phase enhancement are noted, measuring 1.2 cm
anteriorly in the right hepatic lobe (image [DATE]), likely a flash
filling hemangioma. There are additional lesions, including a 1.7 cm
posteriorly in the right lobe (image [DATE]) which are in close
proximity to the hepatic and portal vein branches and may reflect
venous malformations. No suspicious hepatic findings. Stable mild
biliary dilatation status post cholecystectomy.

Pancreas: Unremarkable. No pancreatic ductal dilatation or
surrounding inflammatory changes.

Spleen: Normal in size without focal abnormality.

Adrenals/Urinary Tract: Both adrenal glands appear normal. Pre
contrast images demonstrate several nonobstructing calculi in the
lower pole of the left kidney, measuring up to 7 mm on image [DATE].
No right renal, ureteral or bladder calculi. Post-contrast, both
kidneys enhance normally. There is no evidence of enhancing renal
mass. There are bilateral renal cysts, measuring up to 4.5 cm in the
interpolar region of the left kidney. There is cortical scarring in
the upper pole of the left kidney. Delayed images result in
segmental visualization of the ureters. No focal upper tract
urothelial abnormalities are identified. There is a small cystocele.
The bladder otherwise appears unremarkable.

Stomach/Bowel: No enteric contrast was administered. The stomach is
decompressed and suboptimally evaluated. No evidence of bowel wall
thickening, distention or surrounding inflammation. The appendix is
surgically absent. There are diverticular changes within the
descending and sigmoid colon.

Vascular/Lymphatic: There are no enlarged abdominal or pelvic lymph
nodes. Minimal aortic and branch vessel atherosclerosis. No acute
vascular findings.

Reproductive: Hysterectomy. Residual left ovarian tissue appears
stable. No suspicious adnexal findings.

Other: No evidence of abdominal wall mass or hernia. No ascites.

Musculoskeletal: No acute or significant osseous findings. Mild
spondylosis. Scattered benign bone islands in the spine.
IMPRESSION: 1. Several nonobstructing left renal calculi are again noted,
similar to previous studies. No evidence of ureteral calculus or
hydronephrosis.
2. No evidence of renal mass or focal urothelial lesion. Bilateral
renal cysts and a small cystocele are noted.
3. Chronic distal colonic diverticulosis without evidence of acute
inflammation.
4. Scattered benign hepatic lesions, likely a combination of cysts,
hemangiomas and venous malformations.
5. Stable postsurgical changes as described.
6. Mildly increased reticulonodular densities at both lung bases,
likely due to a chronic inflammatory process such as PGBOBBY infection.

## 2023-09-23 ENCOUNTER — Telehealth: Payer: Self-pay | Admitting: *Deleted

## 2023-09-23 NOTE — Telephone Encounter (Signed)
Patient called in today and states she is having a lot of itching after the cysto. No burning with urination , no fever or chills.  Advised patient to try some yeast cream to see if that may help and drink lots of water. Call the office if that dont help.

## 2023-09-26 ENCOUNTER — Telehealth: Payer: Self-pay | Admitting: Urology

## 2023-09-26 NOTE — Telephone Encounter (Signed)
Patient called and stated that she has been having pain "in her tookie" since cysto on 09/18/23. She said it is mostly when she is getting up after sitting or laying down. Is this normal after procedure, or does she need to be seen? Please advise patient.

## 2023-09-27 MED ORDER — CEPHALEXIN 250 MG PO CAPS: 250.0000 mg | ORAL_CAPSULE | Freq: Two times a day (BID) | ORAL | 0 refills | Status: DC

## 2023-09-27 NOTE — Telephone Encounter (Signed)
Patient states she is having a feeling of like her private area is on fire. No itching, she has not noticed swelling or rash in that area. No urinary issues, no blood, no fever. She has tried taking Clobetasol ointment the last 2 days about 2 times a day and that helps the burning sensation but does not resolve it. No discharge. Would like to know if there is something else she should try or any other feedback. NO pain is present. This started after the Cysto

## 2023-09-27 NOTE — Telephone Encounter (Signed)
Patient and her husband advised and medication sent in to [facility] pharmacy

## 2023-09-30 ENCOUNTER — Telehealth: Payer: Self-pay | Admitting: Urology

## 2023-09-30 NOTE — Progress Notes (Unsigned)
10/01/2023 8:58 PM   [name] 1935/07/15 [id number]  Referring provider: Karie Schwalbe, MD [address] [place],  [place] [address]  Urological history: 1. High risk hematuria -former smoker -CTU (11/2021) - no worrisome findings -cysto (11/2021) - NED -non-contrast CT (12/2022) - nephrolithiasis -cysto (09/2023) - NED  2. Nephrolithiasis -CTU (11/2021) - several stones in left lower pole  -non-contrast CT (12/2022) - several 2 mm calculi in the distal right ureter and right UVJ and 1.2 cm left UPJ    3. Renal cysts -CTU (11/2021) -  bilateral renal cysts, measuring up to 4.5 cm in the interpolar region of the left kidney  4. Cystocele -CTU (11/2021) - small cystocele  5. rUTI's -contributing factors of age, vaginal atrophy, cystocele -documented urine cultures over the last year  03/19/2023 E.coli   02/26/2023 MUF  01/22/2023 MUF  01/03/2023 MUF  12/24/2022 Multiple species   12/13/2022 MUF -vaginal estrogen cream    No chief complaint on file.  HPI: Anamta Allegra is a 88 y.o. female who presents today for pain and discomfort since having cysto with her husband, [name].    Previous records reviewed.   UA ***  PMH: Past Medical History:  Diagnosis Date   Allergy    Arthritis    Chest pain    a. 07/2019 MV: No ischemia/infarct. Low risk; b. 04/2021 MV: No isch/scar. EF 60%. No significant cor Ca2+. Developed atrial tachycardia during study.   Diverticulosis    Esophageal dysmotility    Esophageal ring    GERD (gastroesophageal reflux disease)    Hiatal hernia    HTN (hypertension)    IBS (irritable bowel syndrome)    Osteopenia    PAH (pulmonary artery hypertension) (HCC)    a. 07/2019 Echo: EF 60-65%, impaired relaxation. Nl RV size/fxn. PASP . Mildly dil LA.   Palpitations    Pre-syncope    Renal disorder    Sleep disturbance     Surgical History: Past Surgical History:  Procedure Laterality Date    ABDOMINAL HYSTERECTOMY     APPENDECTOMY  1973   BREAST EXCISIONAL BIOPSY Right    CHOLECYSTECTOMY     ERCP N/A 11/22/2017   Procedure: ENDOSCOPIC RETROGRADE CHOLANGIOPANCREATOGRAPHY (ERCP);  Surgeon: Midge Minium, MD;  Location: [facility] ENDOSCOPY;  Service: Endoscopy;  Laterality: N/A;   ESOPHAGOGASTRODUODENOSCOPY  07-2006   with dilation   KIDNEY SURGERY  1970   lumpectomy  [phone]   benign right breast   SQUAMOUS CELL CARCINOMA EXCISION  6/12   Right leg--Dr Michela Pitcher   SQUAMOUS CELL CARCINOMA EXCISION  2012   right calf   TOTAL ABDOMINAL HYSTERECTOMY W/ BILATERAL SALPINGOOPHORECTOMY  1973    Home Medications:  Allergies as of 10/01/2023       Reactions   Macrobid [nitrofurantoin] Diarrhea   Penicillins    Has patient had a PCN reaction causing immediate rash, facial/tongue/throat swelling, SOB or lightheadedness with hypotension: Unknown Has patient had a PCN reaction causing severe rash involving mucus membranes or skin necrosis: Unknown Has patient had a PCN reaction that required hospitalization: Unknown Has patient had a PCN reaction occurring within the last 10 years: Unknown If all of the above answers are "NO", then may proceed with Cephalosporin use.   Rabeprazole Sodium    REACTION: constipated   Sulfonamide Derivatives    REACTION: unspecified   Cefuroxime    diarrhea        Medication List  Accurate as of September 30, 2023  8:58 PM. If you have any questions, ask your nurse or doctor.          ascorbic acid 500 MG tablet Commonly known as: VITAMIN C Take 500 mg by mouth daily.   celecoxib 200 MG capsule Commonly known as: CELEBREX TAKE 1 CAPSULE BY MOUTH EVERY DAY   cephALEXin 250 MG capsule Commonly known as: Keflex Take 1 capsule (250 mg total) by mouth 2 (two) times daily.   clobetasol ointment 0.05 % Commonly known as: TEMOVATE APPLY TO VULVAR AREA DAILY AT BEDTIME FOR 4 WEEKS, EVERY OTHER DAY FOR 2 WEEKS, TWICE WEEKLY FOR 2 WEEKS, THEN  STOP   clorazepate 3.75 MG tablet Commonly known as: TRANXENE Take 1 tablet (3.75 mg total) by mouth 2 (two) times daily as needed for anxiety.   estradiol 0.1 MG/GM vaginal cream Commonly known as: ESTRACE Estrogen Cream Instruction Discard applicator Apply pea sized amount to tip of finger to urethra before bed. Wash hands well after application. Use Monday, Wednesday and Friday   metoprolol tartrate 50 MG tablet Commonly known as: LOPRESSOR Take 1 tablet (50 mg total) by mouth 2 (two) times daily.   NON FORMULARY Immunity support gummies Takes 1 gummy daily.   NON FORMULARY Black elderberry gummy Takes 1 gummy qd.   omeprazole 20 MG capsule Commonly known as: PRILOSEC TAKE 1 CAPSULE(20 MG) BY MOUTH DAILY   Vitamin D-3 25 MCG (1000 UT) Caps Take 1 capsule by mouth daily.        Allergies:  Allergies  Allergen Reactions   Macrobid [Nitrofurantoin] Diarrhea   Penicillins     Has patient had a PCN reaction causing immediate rash, facial/tongue/throat swelling, SOB or lightheadedness with hypotension: Unknown Has patient had a PCN reaction causing severe rash involving mucus membranes or skin necrosis: Unknown Has patient had a PCN reaction that required hospitalization: Unknown Has patient had a PCN reaction occurring within the last 10 years: Unknown If all of the above answers are "NO", then may proceed with Cephalosporin use.   Rabeprazole Sodium     REACTION: constipated   Sulfonamide Derivatives     REACTION: unspecified   Cefuroxime     diarrhea    Family History: Family History  Problem Relation Age of Onset   Heart attack Father    Stroke Mother    Melanoma Daughter     Social History:  reports that she has quit smoking. She has never been exposed to tobacco smoke. She has never used smokeless tobacco. She reports that she does not currently use alcohol after a past usage of about 1.0 standard drink of alcohol per week. She reports that she does not use  drugs.  ROS: Pertinent ROS in HPI  Physical Exam: There were no vitals taken for this visit.  Constitutional:  Well nourished. Alert and oriented, No acute distress. HEENT: [place] AT, moist mucus membranes.  Trachea midline, no masses. Cardiovascular: No clubbing, cyanosis, or edema. Respiratory: Normal respiratory effort, no increased work of breathing. GU: No CVA tenderness.  No bladder fullness or masses.  Recession of labia minora, dry, pale vulvar vaginal mucosa and loss of mucosal ridges and folds.  Normal urethral meatus, no lesions, no prolapse, no discharge.   No urethral masses, tenderness and/or tenderness. No bladder fullness, tenderness or masses. *** vagina mucosa, *** estrogen effect, no discharge, no lesions, *** pelvic support, *** cystocele and *** rectocele noted.  No cervical motion tenderness.  Uterus is freely  mobile and non-fixed.  No adnexal/parametria masses or tenderness noted.  Anus and perineum are without rashes or lesions.   ***  Neurologic: Grossly intact, no focal deficits, moving all 4 extremities. Psychiatric: Normal mood and affect.    Laboratory Data: Urinalysis See EPIC and HPI  I have reviewed the labs.  Pertinent Imaging: KUB *** I have independently reviewed the films.  See HPI.  Radiologist interpretation still pending.   Assessment & Plan:    1. GSM -continue vaginal estrogen cream three nights weekly  2. Right UVJ stone -no issues with flank pain -likely passed as not visualized on previous KUB  3. Left pelvic stone -no left flank pain  4. High risk hematuria - former smoker -recent work up NED  No follow-ups on file.  These notes generated with voice recognition software. I apologize for typographical errors.  Cloretta Ned  [facility] [address]  [address] [place], [place] [address] [phone]

## 2023-09-30 NOTE — Telephone Encounter (Signed)
Spoke with patient and advised results Appt scheduled 

## 2023-09-30 NOTE — Telephone Encounter (Signed)
Patient's husband called to schedule an appointment with [name] for pain and discomfort that patient has been having since her cystoscopy on 11/6. The first available appointment with [name] is 10/24/23. I offered sooner appointment with Sam, but he declined. He said patient wanted to see [facility]. He was ok with waiting until 12/12. Is it ok for patient to wait, or should she be seen sooner?

## 2023-10-01 ENCOUNTER — Ambulatory Visit
Admission: RE | Admit: 2023-10-01 | Discharge: 2023-10-01 | Disposition: A | Discharge status: Home / Self Care | Payer: Medicare Other | Attending: Urology | Admitting: Urology

## 2023-10-01 ENCOUNTER — Encounter: Payer: Self-pay | Admitting: Urology

## 2023-10-01 ENCOUNTER — Ambulatory Visit (INDEPENDENT_AMBULATORY_CARE_PROVIDER_SITE_OTHER): Payer: Medicare Other | Admitting: Urology

## 2023-10-01 ENCOUNTER — Ambulatory Visit
Admission: RE | Admit: 2023-10-01 | Discharge: 2023-10-01 | Disposition: A | Discharge status: Home / Self Care | Payer: Medicare Other | Source: Ambulatory Visit | Attending: Urology | Admitting: Urology

## 2023-10-01 ENCOUNTER — Telehealth: Payer: Self-pay

## 2023-10-01 VITALS — BP 139/81 | HR 69

## 2023-10-01 DIAGNOSIS — R300 Dysuria: Secondary | ICD-10-CM

## 2023-10-01 DIAGNOSIS — R319 Hematuria, unspecified: Secondary | ICD-10-CM

## 2023-10-01 DIAGNOSIS — R3129 Other microscopic hematuria: Secondary | ICD-10-CM

## 2023-10-01 DIAGNOSIS — N952 Postmenopausal atrophic vaginitis: Secondary | ICD-10-CM

## 2023-10-01 DIAGNOSIS — N201 Calculus of ureter: Secondary | ICD-10-CM | POA: Diagnosis not present

## 2023-10-01 LAB — MICROSCOPIC EXAMINATION: RBC, Urine: >30 /[HPF] — AB (ref 0–2)

## 2023-10-01 LAB — URINALYSIS, COMPLETE
Bilirubin, UA: NEGATIVE
Ketones, UA: NEGATIVE
Nitrite, UA: NEGATIVE
Specific Gravity, UA: >1.03 — ABNORMAL HIGH (ref 1.005–1.030)
Urobilinogen, Ur: 1 mg/dL (ref 0.2–1.0)
pH, UA: 6 (ref 5.0–7.5)

## 2023-10-01 MED ORDER — CEPHALEXIN 250 MG PO CAPS: 250.0000 mg | ORAL_CAPSULE | Freq: Two times a day (BID) | ORAL | 0 refills | Status: DC

## 2023-10-01 MED ORDER — CEPHALEXIN 250 MG PO CAPS: 250.0000 mg | ORAL_CAPSULE | Freq: Two times a day (BID) | ORAL | 0 refills | Status: AC

## 2023-10-01 MED ORDER — ESTRADIOL 0.1 MG/GM VA CREA: TOPICAL_CREAM | VAGINAL | 11 refills | Status: DC

## 2023-10-01 NOTE — Telephone Encounter (Signed)
Spoke with [name], she did want a longer RX for the medication. I am sending in Keflex 1 BID for 7 days and patient advised

## 2023-10-01 NOTE — Telephone Encounter (Signed)
Patient is to take the Keflex RX she picked up for 3 days plus the new RX for 7 days

## 2023-10-01 NOTE — Telephone Encounter (Signed)
Patient called wanting to clarify her RX for Keflex. She thought during the office visit today that she was going to get another RX for Keflex but for 10 days and when she went to pick this up at the pharmacy it was only for 3 days supply. Please advise.

## 2023-10-04 LAB — CULTURE, URINE COMPREHENSIVE

## 2023-10-12 ENCOUNTER — Other Ambulatory Visit: Payer: Self-pay | Admitting: Cardiovascular Disease

## 2023-10-13 NOTE — Progress Notes (Unsigned)
Date:  10/13/2023   ID:  [name], DOB December 11, 1934, MRN [id number]  Patient Location:  [address] [place] [address]   Provider location:   [facility], [place] office  PCP:  Karie Schwalbe, MD  Cardiologist:  [facility]  No chief complaint on file.   History of Present Illness:    Dominique Hall is a 88 y.o. female  past medical history of  chest pain.  palpitations  not diabetic and not a smoker. tachycardia in 2005 ,hospitalized at [facility] with negative work-up.   Who presents for routine follow-up of her chest pain episodes, lightheadedness  LOV November 2023  In follow-up today, reports that she feels well No significant tachycardia, on metoprolol 50 BID In general reports blood pressure well controlled Periodic anxiety, takes clorazepate as needed  Rare episodes of "sinking" /feeling weak, these have much improved by drinking glass of water every morning  Lab work reviewed  EKG personally reviewed by myself on todays visit Sinus bradycardia rate 58 bpm no significant ST or T wave changes  Zio Monitor Normal rhythm with frequent ectopy, short bursts of tachycardia Most of the triggered events seem to be associated with extra beats Recommendation made to increase metoprolol  tartrate up to 50 twice daily  Started higher dose metoprolol 50 BID this morning for paroxysmal tachycardia   Other issues reviewed Reports that she is fighting a UTI, on ABX, Klebsiella pneumoniae  Some recent low blood pressure Denies any near syncope or dizziness  Prior echocardiogram and stress test No ischemia, normal ejection fraction  Stress test 07/2019 Pharmacological myocardial perfusion imaging study with no significant  ischemia Grossly Normal wall motion, Unable to estimate EF secondary to GI uptake artifact No EKG changes concerning for ischemia at peak stress or in recovery. Low risk scan  Echo  07/2019  1. Left  ventricular ejection fraction, by visual estimation, is 60 to 65%. The left ventricle has normal function. Normal left ventricular size. There is no left ventricular hypertrophy.  2. Left ventricular diastolic Doppler parameters are consistent with impaired relaxation pattern of LV diastolic filling.  3. Global right ventricle has normal systolic function.The right ventricular size is normal. No increase in right ventricular wall thickness.  4. Mild to moderately elevated pulmonary artery systolic pressure 42 mm Hg.  5. Left atrial size was mildly dilated.  6. Right atrial size was normal.   Past Medical History:  Diagnosis Date   Allergy    Arthritis    Chest pain    a. 07/2019 MV: No ischemia/infarct. Low risk; b. 04/2021 MV: No isch/scar. EF 60%. No significant cor Ca2+. Developed atrial tachycardia during study.   Diverticulosis    Esophageal dysmotility    Esophageal ring    GERD (gastroesophageal reflux disease)    Hiatal hernia    HTN (hypertension)    IBS (irritable bowel syndrome)    Osteopenia    PAH (pulmonary artery hypertension) (HCC)    a. 07/2019 Echo: EF 60-65%, impaired relaxation. Nl RV size/fxn. PASP . Mildly dil LA.   Palpitations    Pre-syncope    Renal disorder    Sleep disturbance    Past Surgical History:  Procedure Laterality Date   ABDOMINAL HYSTERECTOMY     APPENDECTOMY  1973   BREAST EXCISIONAL BIOPSY Right    CHOLECYSTECTOMY     ERCP N/A 11/22/2017   Procedure: ENDOSCOPIC RETROGRADE CHOLANGIOPANCREATOGRAPHY (ERCP);  Surgeon: Midge Minium,  MD;  Location: [facility] ENDOSCOPY;  Service: Endoscopy;  Laterality: N/A;   ESOPHAGOGASTRODUODENOSCOPY  07-2006   with dilation   KIDNEY SURGERY  1970   lumpectomy  [phone]   benign right breast   SQUAMOUS CELL CARCINOMA EXCISION  6/12   Right leg--Dr Teresa Coombs CELL CARCINOMA EXCISION  2012   right calf   TOTAL ABDOMINAL HYSTERECTOMY W/ BILATERAL SALPINGOOPHORECTOMY  1973     Allergies:   Macrobid  [nitrofurantoin], Penicillins, Rabeprazole sodium, Sulfonamide derivatives, and Cefuroxime   Social History   Tobacco Use   Smoking status: Former    Passive exposure: Never   Smokeless tobacco: Never  Vaping Use   Vaping status: Never Used  Substance Use Topics   Alcohol use: Not Currently    Alcohol/week: 1.0 standard drink of alcohol    Types: 1 Glasses of wine per week    Comment: occ   Drug use: No     Current Outpatient Medications on File Prior to Visit  Medication Sig Dispense Refill   celecoxib (CELEBREX) 200 MG capsule TAKE 1 CAPSULE BY MOUTH EVERY DAY 90 capsule 3   Cholecalciferol (VITAMIN D-3) 25 MCG (1000 UT) CAPS Take 1 capsule by mouth daily.     clobetasol ointment (TEMOVATE) 0.05 % APPLY TO VULVAR AREA DAILY AT BEDTIME FOR 4 WEEKS, EVERY OTHER DAY FOR 2 WEEKS, TWICE WEEKLY FOR 2 WEEKS, THEN STOP 30 g 0   clorazepate (TRANXENE) 3.75 MG tablet Take 1 tablet (3.75 mg total) by mouth 2 (two) times daily as needed for anxiety. 30 tablet 3   estradiol (ESTRACE) 0.1 MG/GM vaginal cream Estrogen Cream Instruction Discard applicator Apply pea sized amount to tip of finger to urethra before bed. Wash hands well after application. Use Monday, Wednesday and Friday 42.5 g 11   metoprolol tartrate (LOPRESSOR) 50 MG tablet Take 1 tablet (50 mg total) by mouth 2 (two) times daily. 180 tablet 3   NON FORMULARY Immunity support gummies Takes 1 gummy daily.     NON FORMULARY Black elderberry gummy Takes 1 gummy qd.     omeprazole (PRILOSEC) 20 MG capsule TAKE 1 CAPSULE(20 MG) BY MOUTH DAILY 90 capsule 3   vitamin C (ASCORBIC ACID) 500 MG tablet Take 500 mg by mouth daily.     No current facility-administered medications on file prior to visit.   Family Hx: The patient's family history includes Heart attack in her father; Melanoma in her daughter; Stroke in her mother.  ROS:   Please see the history of present illness.    Review of Systems  Constitutional: Negative.   HENT:  Negative.    Respiratory: Negative.    Cardiovascular: Negative.   Gastrointestinal: Negative.   Musculoskeletal: Negative.   Neurological: Negative.   Psychiatric/Behavioral: Negative.    All other systems reviewed and are negative.    Labs/Other Tests and Data Reviewed:    Recent Labs: 12/24/2022: Hemoglobin 13.6; Platelets 152 07/31/2023: BUN 22; Creatinine, Ser 0.84; Potassium 3.9; Sodium 142   Recent Lipid Panel Lab Results  Component Value Date/Time   CHOL 152 02/22/2014 04:12 PM   TRIG 98.0 02/22/2014 04:12 PM   HDL 63.00 02/22/2014 04:12 PM   CHOLHDL 2 02/22/2014 04:12 PM   LDLCALC 69 02/22/2014 04:12 PM    Wt Readings from Last 3 Encounters:  09/18/23 138 lb (62.6 kg)  08/01/23 147 lb (66.7 kg)  04/03/23 155 lb (70.3 kg)    Exam:    Vital Signs: Vital signs may also  be detailed in the HPI There were no vitals taken for this visit.  Constitutional:  oriented to person, place, and time. No distress.  HENT:  Head: Grossly normal Eyes:  no discharge. No scleral icterus.  Neck: No JVD, no carotid bruits  Cardiovascular: Regular rate and rhythm, no murmurs appreciated Pulmonary/Chest: Clear to auscultation bilaterally, no wheezes or rails Abdominal: Soft.  no distension.  no tenderness.  Musculoskeletal: Normal range of motion Neurological:  normal muscle tone. Coordination normal. No atrophy Skin: Skin warm and dry Psychiatric: normal affect, pleasant  ASSESSMENT & PLAN:    Problem List Items Addressed This Visit   None  Chest pain: Denies chest pain concerning for angina, no further testing needed  Shortness of breath Likely from deconditioning Recommend walking program for conditioning and leg strength  Essential hypertension Continue metoprolol tartrate 50 twice daily  Recommend she monitor blood pressure at home, recently well controlled on office visit with primary care  Paroxysmal tachycardia on metoprolol tartrate 50 twice daily short narrow  complex tachycardia on Zio monitor Symptoms controlled   Total encounter time more than 30 minutes  Greater than 50% was spent in counseling and coordination of care with the patient   Signed, Julien Nordmann, MD  [facility] [address], [place], [place] [address]

## 2023-10-14 ENCOUNTER — Ambulatory Visit: Payer: 59 | Admitting: Cardiovascular Disease

## 2023-10-14 DIAGNOSIS — I471 Supraventricular tachycardia, unspecified: Secondary | ICD-10-CM

## 2023-10-14 DIAGNOSIS — R079 Chest pain, unspecified: Secondary | ICD-10-CM

## 2023-10-14 DIAGNOSIS — R0602 Shortness of breath: Secondary | ICD-10-CM

## 2023-10-14 DIAGNOSIS — R002 Palpitations: Secondary | ICD-10-CM

## 2023-10-14 DIAGNOSIS — I10 Essential (primary) hypertension: Secondary | ICD-10-CM

## 2023-10-14 DIAGNOSIS — I495 Sick sinus syndrome: Secondary | ICD-10-CM

## 2023-10-14 DIAGNOSIS — R42 Dizziness and giddiness: Secondary | ICD-10-CM

## 2023-10-15 ENCOUNTER — Ambulatory Visit (INDEPENDENT_AMBULATORY_CARE_PROVIDER_SITE_OTHER): Payer: 59 | Admitting: Internal Medicine

## 2023-10-15 ENCOUNTER — Encounter: Payer: Self-pay | Admitting: Internal Medicine

## 2023-10-15 VITALS — BP 108/70 | HR 68 | Temp 97.8°F | Ht 64.0 in | Wt 144.0 lb

## 2023-10-15 DIAGNOSIS — J069 Acute upper respiratory infection, unspecified: Secondary | ICD-10-CM

## 2023-10-15 DIAGNOSIS — R051 Acute cough: Secondary | ICD-10-CM | POA: Diagnosis not present

## 2023-10-15 LAB — POC COVID19 BINAXNOW: SARS Coronavirus 2 Ag: NEGATIVE

## 2023-10-15 MED ORDER — BENZONATATE 200 MG PO CAPS: 200.0000 mg | ORAL_CAPSULE | Freq: Three times a day (TID) | ORAL | 0 refills | Status: DC | PRN

## 2023-10-15 NOTE — Assessment & Plan Note (Signed)
Prominent laryngitis here--and cough Clearly viral Discussed adding tylenol Continue robitussin--and can add benzonatate Would try empiric antibiotic if not better next week

## 2023-10-15 NOTE — Progress Notes (Signed)
Subjective:    Patient ID: [name], female    DOB: [birth year], 88 y.o.   MRN: [id number]  HPI Here with husband due to acute illness  Started 3 days ago--lots of cough--some yellow then clear mucus Now with laryngitis--can't even talk Felt horrible this morning--like she would faint No fever, chills or sweats Slight SOB No headache Some left ear pain No sore throat  Tried robitussin--helped cough  Current Outpatient Medications on File Prior to Visit  Medication Sig Dispense Refill   celecoxib (CELEBREX) 200 MG capsule TAKE 1 CAPSULE BY MOUTH EVERY DAY 90 capsule 3   Cholecalciferol (VITAMIN D-3) 25 MCG (1000 UT) CAPS Take 1 capsule by mouth daily.     clobetasol ointment (TEMOVATE) 0.05 % APPLY TO VULVAR AREA DAILY AT BEDTIME FOR 4 WEEKS, EVERY OTHER DAY FOR 2 WEEKS, TWICE WEEKLY FOR 2 WEEKS, THEN STOP 30 g 0   clorazepate (TRANXENE) 3.75 MG tablet Take 1 tablet (3.75 mg total) by mouth 2 (two) times daily as needed for anxiety. 30 tablet 3   estradiol (ESTRACE) 0.1 MG/GM vaginal cream Estrogen Cream Instruction Discard applicator Apply pea sized amount to tip of finger to urethra before bed. Wash hands well after application. Use Monday, Wednesday and Friday 42.5 g 11   metoprolol tartrate (LOPRESSOR) 50 MG tablet TAKE 1 TABLET(50 MG) BY MOUTH TWICE DAILY 180 tablet 3   NON FORMULARY Immunity support gummies Takes 1 gummy daily.     NON FORMULARY Black elderberry gummy Takes 1 gummy qd.     omeprazole (PRILOSEC) 20 MG capsule TAKE 1 CAPSULE(20 MG) BY MOUTH DAILY 90 capsule 3   vitamin C (ASCORBIC ACID) 500 MG tablet Take 500 mg by mouth daily.     No current facility-administered medications on file prior to visit.    Allergies  Allergen Reactions   Macrobid [Nitrofurantoin] Diarrhea   Penicillins     Has patient had a PCN reaction causing immediate rash, facial/tongue/throat swelling, SOB or lightheadedness with hypotension: Unknown Has patient had a PCN  reaction causing severe rash involving mucus membranes or skin necrosis: Unknown Has patient had a PCN reaction that required hospitalization: Unknown Has patient had a PCN reaction occurring within the last 10 years: Unknown If all of the above answers are "NO", then may proceed with Cephalosporin use.   Rabeprazole Sodium     REACTION: constipated   Sulfonamide Derivatives     REACTION: unspecified   Cefuroxime     diarrhea    Past Medical History:  Diagnosis Date   Allergy    Arthritis    Chest pain    a. 07/2019 MV: No ischemia/infarct. Low risk; b. 04/2021 MV: No isch/scar. EF 60%. No significant cor Ca2+. Developed atrial tachycardia during study.   Diverticulosis    Esophageal dysmotility    Esophageal ring    GERD (gastroesophageal reflux disease)    Hiatal hernia    HTN (hypertension)    IBS (irritable bowel syndrome)    Osteopenia    PAH (pulmonary artery hypertension) (HCC)    a. 07/2019 Echo: EF 60-65%, impaired relaxation. Nl RV size/fxn. PASP . Mildly dil LA.   Palpitations    Pre-syncope    Renal disorder    Sleep disturbance     Past Surgical History:  Procedure Laterality Date   ABDOMINAL HYSTERECTOMY     APPENDECTOMY  1973   BREAST EXCISIONAL BIOPSY Right    CHOLECYSTECTOMY     ERCP N/A 11/22/2017  Procedure: ENDOSCOPIC RETROGRADE CHOLANGIOPANCREATOGRAPHY (ERCP);  Surgeon: Midge Minium, MD;  Location: [facility] ENDOSCOPY;  Service: Endoscopy;  Laterality: N/A;   ESOPHAGOGASTRODUODENOSCOPY  07-2006   with dilation   KIDNEY SURGERY  1970   lumpectomy  [phone]   benign right breast   SQUAMOUS CELL CARCINOMA EXCISION  6/12   Right leg--Dr Teresa Coombs CELL CARCINOMA EXCISION  2012   right calf   TOTAL ABDOMINAL HYSTERECTOMY W/ BILATERAL SALPINGOOPHORECTOMY  1973    Family History  Problem Relation Age of Onset   Heart attack Father    Stroke Mother    Melanoma Daughter     Social History   Socioeconomic History   Marital status: Married     Spouse name: Not on file   Number of children: 2   Years of education: Not on file   Highest education level: Not on file  Occupational History   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  Tobacco Use   Smoking status: Former    Passive exposure: Never   Smokeless tobacco: Never  Vaping Use   Vaping status: Never Used  Substance and Sexual Activity   Alcohol use: Not Currently    Alcohol/week: 1.0 standard drink of alcohol    Types: 1 Glasses of wine per week    Comment: occ   Drug use: No   Sexual activity: Not on file  Other Topics Concern   Not on file  Social History Narrative   Has living will   Husband is [place] health care POA---alternate is daughters Lynne/Amanda   Would accept resuscitation but no prolonged ventilation   Probably would not want tube feedings if cognitively unaware   Social Determinants of Health   Financial Resource Strain: Low Risk  (07/25/2021)   Overall Financial Resource Strain (CARDIA)    Difficulty of Paying Living Expenses: Not very hard  Food Insecurity: Not on file  Transportation Needs: Not on file  Physical Activity: Not on file  Stress: Not on file  Social Connections: Not on file  Intimate Partner Violence: Not on file   Review of Systems No loss of smell or taste No N/V Appetite is off--but able to eat    Objective:   Physical Exam Constitutional:      Appearance: Normal appearance.  HENT:     Mouth/Throat:     Pharynx: No oropharyngeal exudate or posterior oropharyngeal erythema.  Pulmonary:     Effort: Pulmonary effort is normal.     Breath sounds: Normal breath sounds. No wheezing or rales.  Musculoskeletal:     Cervical back: Neck supple.  Lymphadenopathy:     Cervical: No cervical adenopathy.  Neurological:     Mental Status: She is alert.            Assessment & Plan:

## 2023-10-16 ENCOUNTER — Telehealth: Payer: Self-pay | Admitting: Internal Medicine

## 2023-10-16 MED ORDER — DOXYCYCLINE HYCLATE 100 MG PO TABS: 100.0000 mg | ORAL_TABLET | Freq: Two times a day (BID) | ORAL | 0 refills | Status: DC

## 2023-10-16 NOTE — Telephone Encounter (Signed)
Patient is requesting a call back when able regarding current symptoms, states she has a cough/ cold. Can be reached at home number.

## 2023-10-16 NOTE — Telephone Encounter (Signed)
Spoke to pt's husband.

## 2023-10-16 NOTE — Telephone Encounter (Signed)
Spoke to pt's husband. He states she is not having any improvement. Seems to be getting a little worse. Would like to get antibiotic to [facility] [address].

## 2023-10-16 NOTE — Telephone Encounter (Signed)
Please let them know I sent the antibiotic

## 2023-10-18 ENCOUNTER — Telehealth: Payer: Self-pay

## 2023-10-18 NOTE — Telephone Encounter (Signed)
Called husband back and advised that insurance would require a PA due to her age. But, that with a GoodRX card, he could get it at other pharmacies for a lot less than what [facility] was charging him. She said they used a GoodRx card and it went down from $90 to $74. It still would have been about $20 at a different pharmacy than [facility]. Of course [facility] did not advise him of that. He said he understands now and will wait to make sure something is covered or cheaper at a different place before getting it at [facility].

## 2023-10-18 NOTE — Telephone Encounter (Signed)
Ardeth Sportsman   Telephone Encounter Signed   Creation Time: 10/18/2023 12:17 PM   Signed     Pt called in stating she visited his pharmacy to get BENZONATATE. Pt states he paid $74 for the meds. Pt states he was curious as to why his insurance didn't cover the meds? Pt mentioned that he's currently dealing with a cough. Didn't see meds on current med list. Pt states Dr. Alphonsus Sias has prescribed cough meds to him before. Please advise. Call back # [phone]

## 2023-10-24 ENCOUNTER — Ambulatory Visit: Payer: Medicare Other | Admitting: Urology

## 2023-11-22 ENCOUNTER — Ambulatory Visit: Payer: 59 | Admitting: Cardiovascular Disease

## 2023-12-02 ENCOUNTER — Ambulatory Visit
Admission: RE | Admit: 2023-12-02 | Discharge: 2023-12-02 | Disposition: A | Discharge status: Home / Self Care | Payer: Medicare Other | Source: Ambulatory Visit | Attending: Urology | Admitting: Urology

## 2023-12-02 ENCOUNTER — Ambulatory Visit: Payer: Medicare Other | Admitting: Urology

## 2023-12-02 ENCOUNTER — Other Ambulatory Visit
Admission: RE | Admit: 2023-12-02 | Discharge: 2023-12-02 | Disposition: A | Discharge status: Home / Self Care | Payer: Medicare Other | Source: Home / Self Care | Attending: Urology | Admitting: Urology

## 2023-12-02 ENCOUNTER — Encounter: Payer: Self-pay | Admitting: Urology

## 2023-12-02 ENCOUNTER — Other Ambulatory Visit: Payer: Self-pay

## 2023-12-02 ENCOUNTER — Ambulatory Visit
Admission: RE | Admit: 2023-12-02 | Discharge: 2023-12-02 | Disposition: A | Discharge status: Home / Self Care | Payer: Medicare Other | Attending: Urology | Admitting: Urology

## 2023-12-02 VITALS — BP 120/73 | HR 80 | Ht 64.0 in | Wt 141.0 lb

## 2023-12-02 DIAGNOSIS — Z87440 Personal history of urinary (tract) infections: Secondary | ICD-10-CM

## 2023-12-02 DIAGNOSIS — R310 Gross hematuria: Secondary | ICD-10-CM

## 2023-12-02 DIAGNOSIS — Z87898 Personal history of other specified conditions: Secondary | ICD-10-CM | POA: Diagnosis not present

## 2023-12-02 DIAGNOSIS — N200 Calculus of kidney: Secondary | ICD-10-CM

## 2023-12-02 DIAGNOSIS — N952 Postmenopausal atrophic vaginitis: Secondary | ICD-10-CM

## 2023-12-02 LAB — URINALYSIS, COMPLETE (UACMP) WITH MICROSCOPIC
Glucose, UA: NEGATIVE mg/dL
Ketones, ur: NEGATIVE mg/dL
Nitrite: NEGATIVE
Protein, ur: 100 mg/dL — AB
RBC / HPF: >50 RBC/hpf (ref 0–5)
Specific Gravity, Urine: 1.02 (ref 1.005–1.030)
pH: 7 (ref 5.0–8.0)

## 2023-12-02 NOTE — Progress Notes (Signed)
12/02/2023 12:09 PM   Dominique Hall 03-Jun-1935 [id number]  Referring provider: Karie Schwalbe, MD [address] [place],  [place] [address]  Urological history: 1. High risk hematuria -former smoker -CTU (11/2021) - no worrisome findings -cysto (11/2021) - NED -non-contrast CT (12/2022) - nephrolithiasis -cysto (09/2023) - NED  2. Nephrolithiasis -CTU (11/2021) - several stones in left lower pole  -non-contrast CT (12/2022) - several 2 mm calculi in the distal right ureter and right UVJ and 1.2 cm left UPJ    3. Renal cysts -CTU (11/2021) -  bilateral renal cysts, measuring up to 4.5 cm in the interpolar region of the left kidney  4. Cystocele -CTU (11/2021) - small cystocele  5. rUTI's -contributing factors of age, vaginal atrophy, cystocele -documented urine cultures over the last year  10/01/2023 MUF   03/19/2023 E.coli   02/26/2023 MUF  01/22/2023 MUF  01/03/2023 MUF  12/24/2022 Multiple species   12/13/2022 MUF -vaginal estrogen cream    Chief Complaint  Patient presents with   Nephrolithiasis   HPI: Dominique Hall is a 89 y.o. female who presents today for blood in her urine with her husband, Dominique Hall.    Previous records reviewed.   Over the last week, she has had 4 days of intermittent gross heme.  She states she had 1 episode of bright red urine and then the rest had had the color more low.  She has been urinating clear yellow in between.  Patient denies any modifying or aggravating factors.  Patient denies any recent UTI's, dysuria or suprapubic/flank pain.  Patient denies any fevers, chills, nausea or vomiting.    KUB left intrarenal and renal pelvic stones, stable from prior KUB.  UA yellow hazy, specific every 1.020, pH 7.0, moderate heme, small bilirubin, 100 protein, small leukocyte, 0-5 squames, non-squamous epithelial cells present, 6-10 WBCs, greater than 50 RBCs and many bacteria  PMH: Past Medical History:   Diagnosis Date   Allergy    Arthritis    Chest pain    a. 07/2019 MV: No ischemia/infarct. Low risk; b. 04/2021 MV: No isch/scar. EF 60%. No significant cor Ca2+. Developed atrial tachycardia during study.   Diverticulosis    Esophageal dysmotility    Esophageal ring    GERD (gastroesophageal reflux disease)    Hiatal hernia    HTN (hypertension)    IBS (irritable bowel syndrome)    Kidney stone    Osteopenia    PAH (pulmonary artery hypertension) (HCC)    a. 07/2019 Echo: EF 60-65%, impaired relaxation. Nl RV size/fxn. PASP . Mildly dil LA.   Palpitations    Pre-syncope    Renal disorder    Sleep disturbance     Surgical History: Past Surgical History:  Procedure Laterality Date   ABDOMINAL HYSTERECTOMY     APPENDECTOMY  1973   BREAST EXCISIONAL BIOPSY Right    CHOLECYSTECTOMY     ERCP N/A 11/22/2017   Procedure: ENDOSCOPIC RETROGRADE CHOLANGIOPANCREATOGRAPHY (ERCP);  Surgeon: Midge Minium, MD;  Location: [facility] ENDOSCOPY;  Service: Endoscopy;  Laterality: N/A;   ESOPHAGOGASTRODUODENOSCOPY  07-2006   with dilation   KIDNEY SURGERY  1970   lumpectomy  [phone]   benign right breast   SQUAMOUS CELL CARCINOMA EXCISION  6/12   Right leg--Dr Michela Pitcher   SQUAMOUS CELL CARCINOMA EXCISION  2012   right calf   TOTAL ABDOMINAL HYSTERECTOMY W/ BILATERAL SALPINGOOPHORECTOMY  1973    Home Medications:  Allergies as of 12/02/2023  Reactions   Macrobid [nitrofurantoin] Diarrhea   Penicillins    Has patient had a PCN reaction causing immediate rash, facial/tongue/throat swelling, SOB or lightheadedness with hypotension: Unknown Has patient had a PCN reaction causing severe rash involving mucus membranes or skin necrosis: Unknown Has patient had a PCN reaction that required hospitalization: Unknown Has patient had a PCN reaction occurring within the last 10 years: Unknown If all of the above answers are "NO", then may proceed with Cephalosporin use.   Rabeprazole Sodium     REACTION: constipated   Sulfonamide Derivatives    REACTION: unspecified   Cefuroxime    diarrhea        Medication List        Accurate as of December 02, 2023 12:09 PM. If you have any questions, ask your nurse or doctor.          STOP taking these medications    Vitamin D-3 25 MCG (1000 UT) Caps Stopped by: Laurita Peron       TAKE these medications    ascorbic acid 500 MG tablet Commonly known as: VITAMIN C Take 500 mg by mouth daily.   benzonatate 200 MG capsule Commonly known as: TESSALON Take 1 capsule (200 mg total) by mouth 3 (three) times daily as needed for cough.   celecoxib 200 MG capsule Commonly known as: CELEBREX TAKE 1 CAPSULE BY MOUTH EVERY DAY   clobetasol ointment 0.05 % Commonly known as: TEMOVATE APPLY TO VULVAR AREA DAILY AT BEDTIME FOR 4 WEEKS, EVERY OTHER DAY FOR 2 WEEKS, TWICE WEEKLY FOR 2 WEEKS, THEN STOP   clorazepate 3.75 MG tablet Commonly known as: TRANXENE Take 1 tablet (3.75 mg total) by mouth 2 (two) times daily as needed for anxiety.   doxycycline 100 MG tablet Commonly known as: VIBRA-TABS Take 1 tablet (100 mg total) by mouth 2 (two) times daily.   estradiol 0.1 MG/GM vaginal cream Commonly known as: ESTRACE Estrogen Cream Instruction Discard applicator Apply pea sized amount to tip of finger to urethra before bed. Wash hands well after application. Use Monday, Wednesday and Friday   metoprolol tartrate 50 MG tablet Commonly known as: LOPRESSOR TAKE 1 TABLET(50 MG) BY MOUTH TWICE DAILY   NON FORMULARY Immunity support gummies Takes 1 gummy daily.   NON FORMULARY Black elderberry gummy Takes 1 gummy qd.   omeprazole 20 MG capsule Commonly known as: PRILOSEC TAKE 1 CAPSULE(20 MG) BY MOUTH DAILY        Allergies:  Allergies  Allergen Reactions   Macrobid [Nitrofurantoin] Diarrhea   Penicillins     Has patient had a PCN reaction causing immediate rash, facial/tongue/throat swelling, SOB or  lightheadedness with hypotension: Unknown Has patient had a PCN reaction causing severe rash involving mucus membranes or skin necrosis: Unknown Has patient had a PCN reaction that required hospitalization: Unknown Has patient had a PCN reaction occurring within the last 10 years: Unknown If all of the above answers are "NO", then may proceed with Cephalosporin use.   Rabeprazole Sodium     REACTION: constipated   Sulfonamide Derivatives     REACTION: unspecified   Cefuroxime     diarrhea    Family History: Family History  Problem Relation Age of Onset   Heart attack Father    Stroke Mother    Melanoma Daughter     Social History:  reports that she has quit smoking. She has never been exposed to tobacco smoke. She has never used smokeless tobacco. She reports that she  does not currently use alcohol after a past usage of about 1.0 standard drink of alcohol per week. She reports that she does not use drugs.  ROS: Pertinent ROS in HPI  Physical Exam: BP 120/73 (BP Location: Left Arm, Patient Position: Sitting, Cuff Size: Normal)   Pulse 80   Ht 5\' 4"  (1.626 m)   Wt 141 lb (64 kg)   SpO2 96%   BMI 24.20 kg/m   Constitutional:  Well nourished. Alert and oriented, No acute distress. HEENT: [place] AT, moist mucus membranes.  Trachea midline, no masses. Cardiovascular: No clubbing, cyanosis, or edema. Respiratory: Normal respiratory effort, no increased work of breathing. Neurologic: Grossly intact, no focal deficits, moving all 4 extremities. Psychiatric: Normal mood and affect.    Laboratory Data: Urinalysis See EPIC and HPI  I have reviewed the labs.  Pertinent Imaging: KUB stable left renal calculi I have independently reviewed the films.  See HPI.  Radiologist interpretation still pending.   Assessment & Plan:    1. GSM -continue vaginal estrogen cream three nights weekly  2. Left pelvic stone -no left flank pain -We discussed treatment options for her stone (ESWL  and URS) and at this time due to her age and comorbidities it would be risky to pursue either options unless she started to develop persistent gross hematuria, intractable flank pain or symptoms of infection -They would like to follow-up in April  3. High risk hematuria - former smoker -recent work up NED -Recent episodes of gross heme -We discussed that is likely due to the rotation of her left renal calculi -I explained that having blood in the urine on intermittent basis at this time with no other symptoms is not worrisome at this time as it is likely due to the rotation of the left renal calculi as she had a negative hematuria workup just recently.  I will go ahead and send the urine for culture to rule out any indolent infection.  I did express that we do need to be concerned if she should continue to have blood in her urine persistently without clearing of the blood in between voids and it 3 persisting 4 days or associated with other symptoms, such as flank pain, dysuria, fever or chills -UA w/ micro heme -urine culture pending, will hold on prescribing an antibiotic until culture results are available   Return for UA, KUB .  These notes generated with voice recognition software. I apologize for typographical errors.  Cloretta Ned  [facility] [address]  [address] [place], [place] [address] [phone]

## 2023-12-03 ENCOUNTER — Ambulatory Visit: Payer: 59 | Admitting: Cardiovascular Disease

## 2023-12-03 LAB — URINE CULTURE

## 2023-12-04 ENCOUNTER — Other Ambulatory Visit: Payer: Self-pay | Admitting: Urology

## 2023-12-04 DIAGNOSIS — R3989 Other symptoms and signs involving the genitourinary system: Secondary | ICD-10-CM

## 2023-12-04 MED ORDER — DOXYCYCLINE HYCLATE 100 MG PO CAPS: 100.0000 mg | ORAL_CAPSULE | Freq: Two times a day (BID) | ORAL | 0 refills | Status: DC

## 2023-12-23 NOTE — Progress Notes (Signed)
12/25/2023 2:51 PM   Dominique Hall 28-Nov-1934 [id number]  Referring provider: Karie Schwalbe, MD [address] [place],  [place] [address]  Urological history: 1. High risk hematuria -former smoker -CTU (11/2021) - no worrisome findings -cysto (11/2021) - NED -non-contrast CT (12/2022) - nephrolithiasis -cysto (09/2023) - NED  2. Nephrolithiasis -CTU (11/2021) - several stones in left lower pole  -non-contrast CT (12/2022) - several 2 mm calculi in the distal right ureter and right UVJ and 1.2 cm left UPJ    3. Renal cysts -CTU (11/2021) -  bilateral renal cysts, measuring up to 4.5 cm in the interpolar region of the left kidney  4. Cystocele -CTU (11/2021) - small cystocele  5. rUTI's -contributing factors of age, vaginal atrophy, cystocele -documented urine cultures over the last year  10/01/2023 MUF   03/19/2023 E.coli   02/26/2023 MUF  01/22/2023 MUF  01/03/2023 MUF  12/24/2022 Multiple species  -vaginal estrogen cream    Chief Complaint  Patient presents with   Follow-up   HPI: Dominique Hall is a 89 y.o. female who presents today for follow up with her husband, Dominique Hall.    Previous records reviewed.   At her visit on December 02, 2023, over the last week, she has had 4 days of intermittent gross heme.  She states she had 1 episode of bright red urine and then the rest had had the color more low.  She has been urinating clear yellow in between.  Patient denies any modifying or aggravating factors.  Patient denies any recent UTI's, dysuria or suprapubic/flank pain.  Patient denies any fevers, chills, nausea or vomiting.  KUB left intrarenal and renal pelvic stones, stable from prior KUB.  UA yellow hazy, specific every 1.020, pH 7.0, moderate heme, small bilirubin, 100 protein, small leukocyte, 0-5 squames, non-squamous epithelial cells present, 6-10 WBCs, greater than 50 RBCs and many bacteria.  Her urine grew out multiple species present.   She still started on antibiotic.  She presents today for follow-up.  She feels well today with no signs of UTI.  Patient denies any modifying or aggravating factors.  Patient denies any recent UTI's, gross hematuria, dysuria or suprapubic/flank pain.  Patient denies any fevers, chills, nausea or vomiting.    UA yellow slightly cloudy, specific area 1.025, 3+ blood, pH 6.0, 2+ protein, 1+ leukocyte, 11-30 WBCs, greater than 30 RBCs, 0-10 epithelial cells, hyaline cast present, mucus threads present and moderate bacteria.  PMH: Past Medical History:  Diagnosis Date   Allergy    Arthritis    Chest pain    a. 07/2019 MV: No ischemia/infarct. Low risk; b. 04/2021 MV: No isch/scar. EF 60%. No significant cor Ca2+. Developed atrial tachycardia during study.   Diverticulosis    Esophageal dysmotility    Esophageal ring    GERD (gastroesophageal reflux disease)    Hiatal hernia    HTN (hypertension)    IBS (irritable bowel syndrome)    Kidney stone    Osteopenia    PAH (pulmonary artery hypertension) (HCC)    a. 07/2019 Echo: EF 60-65%, impaired relaxation. Nl RV size/fxn. PASP . Mildly dil LA.   Palpitations    Pre-syncope    Renal disorder    Sleep disturbance     Surgical History: Past Surgical History:  Procedure Laterality Date   ABDOMINAL HYSTERECTOMY     APPENDECTOMY  1973   BREAST EXCISIONAL BIOPSY Right    CHOLECYSTECTOMY     ERCP N/A 11/22/2017  Procedure: ENDOSCOPIC RETROGRADE CHOLANGIOPANCREATOGRAPHY (ERCP);  Surgeon: Midge Minium, MD;  Location: [facility] ENDOSCOPY;  Service: Endoscopy;  Laterality: N/A;   ESOPHAGOGASTRODUODENOSCOPY  07-2006   with dilation   KIDNEY SURGERY  1970   lumpectomy  [phone]   benign right breast   SQUAMOUS CELL CARCINOMA EXCISION  6/12   Right leg--Dr Michela Pitcher   SQUAMOUS CELL CARCINOMA EXCISION  2012   right calf   TOTAL ABDOMINAL HYSTERECTOMY W/ BILATERAL SALPINGOOPHORECTOMY  1973    Home Medications:  Allergies as of 12/25/2023        Reactions   Macrobid [nitrofurantoin] Diarrhea   Penicillins    Has patient had a PCN reaction causing immediate rash, facial/tongue/throat swelling, SOB or lightheadedness with hypotension: Unknown Has patient had a PCN reaction causing severe rash involving mucus membranes or skin necrosis: Unknown Has patient had a PCN reaction that required hospitalization: Unknown Has patient had a PCN reaction occurring within the last 10 years: Unknown If all of the above answers are "NO", then may proceed with Cephalosporin use.   Rabeprazole Sodium    REACTION: constipated   Sulfonamide Derivatives    REACTION: unspecified   Cefuroxime    diarrhea        Medication List        Accurate as of December 25, 2023  2:51 PM. If you have any questions, ask your nurse or doctor.          ascorbic acid 500 MG tablet Commonly known as: VITAMIN C Take 500 mg by mouth daily.   benzonatate 200 MG capsule Commonly known as: TESSALON Take 1 capsule (200 mg total) by mouth 3 (three) times daily as needed for cough.   celecoxib 200 MG capsule Commonly known as: CELEBREX TAKE 1 CAPSULE BY MOUTH EVERY DAY   clobetasol ointment 0.05 % Commonly known as: TEMOVATE APPLY TO VULVAR AREA DAILY AT BEDTIME FOR 4 WEEKS, EVERY OTHER DAY FOR 2 WEEKS, TWICE WEEKLY FOR 2 WEEKS, THEN STOP   clorazepate 3.75 MG tablet Commonly known as: TRANXENE Take 1 tablet (3.75 mg total) by mouth 2 (two) times daily as needed for anxiety.   doxycycline 100 MG tablet Commonly known as: VIBRA-TABS Take 1 tablet (100 mg total) by mouth 2 (two) times daily.   doxycycline 100 MG capsule Commonly known as: VIBRAMYCIN Take 1 capsule (100 mg total) by mouth every 12 (twelve) hours.   estradiol 0.1 MG/GM vaginal cream Commonly known as: ESTRACE Estrogen Cream Instruction Discard applicator Apply pea sized amount to tip of finger to urethra before bed. Wash hands well after application. Use Monday, Wednesday and Friday    metoprolol tartrate 50 MG tablet Commonly known as: LOPRESSOR TAKE 1 TABLET(50 MG) BY MOUTH TWICE DAILY   NON FORMULARY Immunity support gummies Takes 1 gummy daily.   NON FORMULARY Black elderberry gummy Takes 1 gummy qd.   omeprazole 20 MG capsule Commonly known as: PRILOSEC TAKE 1 CAPSULE(20 MG) BY MOUTH DAILY        Allergies:  Allergies  Allergen Reactions   Macrobid [Nitrofurantoin] Diarrhea   Penicillins     Has patient had a PCN reaction causing immediate rash, facial/tongue/throat swelling, SOB or lightheadedness with hypotension: Unknown Has patient had a PCN reaction causing severe rash involving mucus membranes or skin necrosis: Unknown Has patient had a PCN reaction that required hospitalization: Unknown Has patient had a PCN reaction occurring within the last 10 years: Unknown If all of the above answers are "NO", then may proceed with  Cephalosporin use.   Rabeprazole Sodium     REACTION: constipated   Sulfonamide Derivatives     REACTION: unspecified   Cefuroxime     diarrhea    Family History: Family History  Problem Relation Age of Onset   Heart attack Father    Stroke Mother    Melanoma Daughter     Social History:  reports that she has quit smoking. She has never been exposed to tobacco smoke. She has never used smokeless tobacco. She reports that she does not currently use alcohol after a past usage of about 1.0 standard drink of alcohol per week. She reports that she does not use drugs.  ROS: Pertinent ROS in HPI  Physical Exam: BP (!) 150/59   Pulse 62   Constitutional:  Well nourished. Alert and oriented, No acute distress. HEENT: [place] AT, moist mucus membranes.  Trachea midline, no masses. Cardiovascular: No clubbing, cyanosis, or edema. Respiratory: Normal respiratory effort, no increased work of breathing. Neurologic: Grossly intact, no focal deficits, moving all 4 extremities. Psychiatric: Normal mood and affect.    Laboratory  Data: Urinalysis See EPIC and HPI  I have reviewed the labs.  Pertinent Imaging: N/A  Assessment & Plan:    1. GSM -continue vaginal estrogen cream three nights weekly  2. Left pelvic stone -no left flank pain -Continue to monitor -They would like to follow-up in 4 months   3. High risk hematuria - former smoker -recent work up NED -UA w/ micro heme -this will likely be chronic due to her kidney stone, we will continue to monitor to ensure it does not worsen -urine culture pending, will hold on prescribing an antibiotic until culture results are available  Return in about 4 months (around 04/23/2024) for KUB and UA .  These notes generated with voice recognition software. I apologize for typographical errors.  Cloretta Ned  [facility] [address]  [address] [place], [place] [address] [phone]

## 2023-12-25 ENCOUNTER — Encounter: Payer: Self-pay | Admitting: Urology

## 2023-12-25 ENCOUNTER — Ambulatory Visit: Payer: Self-pay | Admitting: Urology

## 2023-12-25 VITALS — BP 150/59 | HR 62

## 2023-12-25 DIAGNOSIS — R319 Hematuria, unspecified: Secondary | ICD-10-CM

## 2023-12-25 DIAGNOSIS — Z87898 Personal history of other specified conditions: Secondary | ICD-10-CM | POA: Diagnosis not present

## 2023-12-25 DIAGNOSIS — N952 Postmenopausal atrophic vaginitis: Secondary | ICD-10-CM | POA: Diagnosis not present

## 2023-12-25 DIAGNOSIS — N200 Calculus of kidney: Secondary | ICD-10-CM

## 2023-12-25 LAB — MICROSCOPIC EXAMINATION: RBC, Urine: >30 /[HPF] — AB (ref 0–2)

## 2023-12-25 LAB — URINALYSIS, COMPLETE
Bilirubin, UA: NEGATIVE
Glucose, UA: NEGATIVE
Ketones, UA: NEGATIVE
Nitrite, UA: NEGATIVE
Specific Gravity, UA: 1.025 (ref 1.005–1.030)
Urobilinogen, Ur: 1 mg/dL (ref 0.2–1.0)
pH, UA: 6 (ref 5.0–7.5)

## 2023-12-30 LAB — CULTURE, URINE COMPREHENSIVE

## 2024-01-03 ENCOUNTER — Other Ambulatory Visit: Payer: Self-pay | Admitting: Internal Medicine

## 2024-01-13 ENCOUNTER — Telehealth: Payer: Self-pay

## 2024-01-13 NOTE — Telephone Encounter (Signed)
 Copied from CRM [id number]. Topic: Clinical - Prescription Issue >> Jan 13, 2024  4:05 PM Irine Seal wrote: Reason for CRM: The patient is requesting a callback from [facility] regarding a prescription refill that was denied by insurance. The patient wants to resolve the issue directly with [facility] before involving Dr. Alphonsus Sias. The patient did not want to provide details about the medication and prefers to speak with [facility] only.  Contact Information: CB# [phone]

## 2024-01-14 NOTE — Telephone Encounter (Signed)
 Spoke to pt's husband. Advised they were inquiring about clorazepate being denied. I advised it did not look like it came from Dr Alphonsus Sias. It is most likely insurance as it is considered a high risk medication for her age. But, I did not see anything in her chart about a prior auth that may be needed for it. I did look it up on [employer] and it is less than $30 at a few different pharmacies. He will check with insurance to see what it was and will get back with [place] if anything is needed.

## 2024-01-22 ENCOUNTER — Other Ambulatory Visit: Payer: Self-pay | Admitting: Internal Medicine

## 2024-02-21 ENCOUNTER — Encounter: Payer: Self-pay | Admitting: Cardiovascular Disease

## 2024-02-21 ENCOUNTER — Ambulatory Visit: Payer: 59 | Attending: Cardiovascular Disease | Admitting: Cardiovascular Disease

## 2024-02-21 VITALS — BP 150/80 | HR 57 | Ht 64.0 in | Wt 139.2 lb

## 2024-02-21 DIAGNOSIS — R0602 Shortness of breath: Secondary | ICD-10-CM | POA: Diagnosis not present

## 2024-02-21 DIAGNOSIS — I471 Supraventricular tachycardia, unspecified: Secondary | ICD-10-CM | POA: Diagnosis not present

## 2024-02-21 DIAGNOSIS — R002 Palpitations: Secondary | ICD-10-CM

## 2024-02-21 DIAGNOSIS — R079 Chest pain, unspecified: Secondary | ICD-10-CM

## 2024-02-21 DIAGNOSIS — I10 Essential (primary) hypertension: Secondary | ICD-10-CM

## 2024-02-21 DIAGNOSIS — I495 Sick sinus syndrome: Secondary | ICD-10-CM

## 2024-02-21 NOTE — Progress Notes (Signed)
 Date:  02/21/2024   ID:  Dominique Hall, DOB Mar 21, 1935, MRN [id number]  Patient Location:  [address] [place] [address]   Provider location:   [facility], [place] office  PCP:  Karie Schwalbe, MD  Cardiologist:  Fonnie Mu  Chief Complaint  Patient presents with   12 month follow up     "Doing well."     History of Present Illness:    Dominique Hall is a 89 y.o. female  past medical history of  chest pain.  palpitations  not diabetic and not a smoker. tachycardia in 2005 ,hospitalized at [facility] with negative work-up.   Periodic urinary tract infections Who presents for routine follow-up of her chest pain episodes, lightheadedness  Last office visit November 2023 In follow-up today presents with her hide spend Reports doing well No regular exercise program, reports that she does not have many hobbies  Weight down 152 last clinic visit, now 139  Denies significant tachycardia Tolerating metoprolol tartrate 50 twice daily  Periodic anxiety, previously requested Ativan, we had recommended she talk with primary care  Rare episodes of "sinking" /feeling weak, these have resolved with better hydration  EKG personally reviewed by myself on todays visit EKG Interpretation Date/Time:  Friday February 21 2024 16:01:28 EDT Ventricular Rate:  57 PR Interval:  188 QRS Duration:  90 QT Interval:  444 QTC Calculation: 432 R Axis:   18  Text Interpretation: Sinus bradycardia Cannot rule out Anterior infarct , age undetermined When compared with ECG of 21-Nov-2017 13:25, Premature ventricular complexes are no longer Present Nonspecific T wave abnormality, improved in Lateral leads Confirmed by Julien Nordmann [phone]) on 02/21/2024 4:27:40 PM   Zio Monitor Normal rhythm with frequent ectopy, short bursts of tachycardia Most of the triggered events seem to be associated with extra beats Recommendation made to increase metoprolol   tartrate up to 50 twice daily  Echo  07/2019  1. Left ventricular ejection fraction, by visual estimation, is 60 to 65%. The left ventricle has normal function. Normal left ventricular size. There is no left ventricular hypertrophy.  2. Left ventricular diastolic Doppler parameters are consistent with impaired relaxation pattern of LV diastolic filling.  3. Global right ventricle has normal systolic function.The right ventricular size is normal. No increase in right ventricular wall thickness.  4. Mild to moderately elevated pulmonary artery systolic pressure 42 mm Hg.  5. Left atrial size was mildly dilated.  6. Right atrial size was normal.   Past Medical History:  Diagnosis Date   Allergy    Arthritis    Chest pain    a. 07/2019 MV: No ischemia/infarct. Low risk; b. 04/2021 MV: No isch/scar. EF 60%. No significant cor Ca2+. Developed atrial tachycardia during study.   Diverticulosis    Esophageal dysmotility    Esophageal ring    GERD (gastroesophageal reflux disease)    Hiatal hernia    HTN (hypertension)    IBS (irritable bowel syndrome)    Kidney stone    Osteopenia    PAH (pulmonary artery hypertension) (HCC)    a. 07/2019 Echo: EF 60-65%, impaired relaxation. Nl RV size/fxn. PASP . Mildly dil LA.   Palpitations    Pre-syncope    Renal disorder    Sleep disturbance    Past Surgical History:  Procedure Laterality Date   ABDOMINAL HYSTERECTOMY     APPENDECTOMY  1973   BREAST EXCISIONAL BIOPSY Right    CHOLECYSTECTOMY  ERCP N/A 11/22/2017   Procedure: ENDOSCOPIC RETROGRADE CHOLANGIOPANCREATOGRAPHY (ERCP);  Surgeon: Midge Minium, MD;  Location: [facility] ENDOSCOPY;  Service: Endoscopy;  Laterality: N/A;   ESOPHAGOGASTRODUODENOSCOPY  07-2006   with dilation   KIDNEY SURGERY  1970   lumpectomy  [phone]   benign right breast   SQUAMOUS CELL CARCINOMA EXCISION  6/12   Right leg--Dr Teresa Coombs CELL CARCINOMA EXCISION  2012   right calf   TOTAL ABDOMINAL HYSTERECTOMY  W/ BILATERAL SALPINGOOPHORECTOMY  1973     Allergies:   Macrobid [nitrofurantoin], Penicillins, Rabeprazole sodium, Sulfonamide derivatives, and Cefuroxime   Social History   Tobacco Use   Smoking status: Former    Passive exposure: Never   Smokeless tobacco: Never  Vaping Use   Vaping status: Never Used  Substance Use Topics   Alcohol use: Not Currently    Alcohol/week: 1.0 standard drink of alcohol    Types: 1 Glasses of wine per week    Comment: occ   Drug use: No     Current Outpatient Medications on File Prior to Visit  Medication Sig Dispense Refill   celecoxib (CELEBREX) 200 MG capsule TAKE 1 CAPSULE BY MOUTH EVERY DAY 90 capsule 0   clobetasol ointment (TEMOVATE) 0.05 % APPLY TO VULVAR AREA DAILY AT BEDTIME FOR 4 WEEKS, EVERY OTHER DAY FOR 2 WEEKS, TWICE WEEKLY FOR 2 WEEKS, THEN STOP 30 g 0   clorazepate (TRANXENE) 3.75 MG tablet Take 1 tablet (3.75 mg total) by mouth 2 (two) times daily as needed for anxiety. 30 tablet 3   estradiol (ESTRACE) 0.1 MG/GM vaginal cream Estrogen Cream Instruction Discard applicator Apply pea sized amount to tip of finger to urethra before bed. Wash hands well after application. Use Monday, Wednesday and Friday 42.5 g 11   metoprolol tartrate (LOPRESSOR) 50 MG tablet TAKE 1 TABLET(50 MG) BY MOUTH TWICE DAILY 180 tablet 3   NON FORMULARY Immunity support gummies Takes 1 gummy daily.     NON FORMULARY Black elderberry gummy Takes 1 gummy qd.     omeprazole (PRILOSEC) 20 MG capsule TAKE 1 CAPSULE(20 MG) BY MOUTH DAILY 90 capsule 3   vitamin C (ASCORBIC ACID) 500 MG tablet Take 500 mg by mouth daily.     No current facility-administered medications on file prior to visit.   Family Hx: The patient's family history includes Heart attack in her father; Melanoma in her daughter; Stroke in her mother.  ROS:   Please see the history of present illness.    Review of Systems  Constitutional: Negative.   HENT: Negative.    Respiratory: Negative.     Cardiovascular: Negative.   Gastrointestinal: Negative.   Musculoskeletal: Negative.   Neurological: Negative.   Psychiatric/Behavioral: Negative.    All other systems reviewed and are negative.    Labs/Other Tests and Data Reviewed:    Recent Labs: 07/31/2023: BUN 22; Creatinine, Ser 0.84; Potassium 3.9; Sodium 142   Recent Lipid Panel Lab Results  Component Value Date/Time   CHOL 152 02/22/2014 04:12 PM   TRIG 98.0 02/22/2014 04:12 PM   HDL 63.00 02/22/2014 04:12 PM   CHOLHDL 2 02/22/2014 04:12 PM   LDLCALC 69 02/22/2014 04:12 PM    Wt Readings from Last 3 Encounters:  02/21/24 139 lb 4 oz (63.2 kg)  12/02/23 141 lb (64 kg)  10/15/23 144 lb (65.3 kg)    Exam:    Vital Signs: Vital signs may also be detailed in the HPI BP (!) 150/80 (BP Location: Left  Arm, Patient Position: Sitting, Cuff Size: Normal)   Pulse (!) 57   Ht 5\' 4"  (1.626 m)   Wt 139 lb 4 oz (63.2 kg)   SpO2 97%   BMI 23.90 kg/m   Constitutional:  oriented to person, place, and time. No distress.  HENT:  Head: Grossly normal Eyes:  no discharge. No scleral icterus.  Neck: No JVD, no carotid bruits  Cardiovascular: Regular rate and rhythm, no murmurs appreciated Pulmonary/Chest: Clear to auscultation bilaterally, no wheezes or rails Abdominal: Soft.  no distension.  no tenderness.  Musculoskeletal: Normal range of motion Neurological:  normal muscle tone. Coordination normal. No atrophy Skin: Skin warm and dry Psychiatric: normal affect, pleasant  ASSESSMENT & PLAN:    Problem List Items Addressed This Visit       Cardiology Problems   Essential hypertension, benign   Relevant Orders   EKG 12-Lead (Completed)   SVT (supraventricular tachycardia) (HCC) - Primary   Relevant Orders   EKG 12-Lead (Completed)   Other Visit Diagnoses       Chest pain of uncertain etiology       Relevant Orders   EKG 12-Lead (Completed)     SOB (shortness of breath)       Relevant Orders   EKG 12-Lead  (Completed)     Palpitations       Relevant Orders   EKG 12-Lead (Completed)     Tachycardia-bradycardia syndrome (HCC)       Relevant Orders   EKG 12-Lead (Completed)     Chest pain: Currently with no symptoms of angina. No further workup at this time. Continue current medication regimen.  Shortness of breath Likely from deconditioning Recommended regular walking program for conditioning  Essential hypertension Blood pressure elevated on today's visit, recommended periodic monitoring of pressure at home, call us if numbers remain elevated  Paroxysmal tachycardia on metoprolol tartrate 50 twice daily Symptoms well-controlled   Signed, Julien Nordmann, MD  [facility] [address], [place], [place] [address]

## 2024-02-21 NOTE — Patient Instructions (Signed)

## 2024-02-25 ENCOUNTER — Other Ambulatory Visit: Payer: Self-pay | Admitting: Internal Medicine

## 2024-02-25 NOTE — Telephone Encounter (Signed)
 Last written 08-01-23 #30 and 3 refills.  Last OV Acute 10-15-23 Next OV 08-03-24

## 2024-03-03 ENCOUNTER — Ambulatory Visit: Payer: Medicare Other | Admitting: Urology

## 2024-04-18 ENCOUNTER — Other Ambulatory Visit: Payer: Self-pay | Admitting: Internal Medicine

## 2024-04-21 ENCOUNTER — Other Ambulatory Visit: Payer: Self-pay | Admitting: Urology

## 2024-04-21 DIAGNOSIS — N200 Calculus of kidney: Secondary | ICD-10-CM

## 2024-04-21 NOTE — Progress Notes (Unsigned)
 04/22/2024 3:19 PM   Dominique Hall 04-Feb-1935 [id number]  Referring provider: Helaine Llanos, MD [address] [place],  [place] [address]  Urological history: 1. High risk hematuria -former smoker -CTU (11/2021) - no worrisome findings -cysto (11/2021) - NED -non-contrast CT (12/2022) - nephrolithiasis -cysto (09/2023) - NED  2. Nephrolithiasis -CTU (11/2021) - several stones in left lower pole  -non-contrast CT (12/2022) - several 2 mm calculi in the distal right ureter and right UVJ and 1.2 cm left UPJ    3. Renal cysts -CTU (11/2021) -  bilateral renal cysts, measuring up to 4.5 cm in the interpolar region of the left kidney  4. Cystocele -CTU (11/2021) - small cystocele  5. rUTI's -contributing factors of age, vaginal atrophy, cystocele - December 25, 2023, mixed urogenital flora - December 02, 2023, multiple species present - October 01, 2023, mixed urogenital flora -vaginal estrogen cream    No chief complaint on file.  HPI: Dominique Hall is a 89 y.o. female who presents today for follow up with her husband, Dominique Hall.    Previous records reviewed.    PMH: Past Medical History:  Diagnosis Date   Allergy    Arthritis    Chest pain    a. 07/2019 MV: No ischemia/infarct. Low risk; b. 04/2021 MV: No isch/scar. EF 60%. No significant cor Ca2+. Developed atrial tachycardia during study.   Diverticulosis    Esophageal dysmotility    Esophageal ring    GERD (gastroesophageal reflux disease)    Hiatal hernia    HTN (hypertension)    IBS (irritable bowel syndrome)    Kidney stone    Osteopenia    PAH (pulmonary artery hypertension) (HCC)    a. 07/2019 Echo: EF 60-65%, impaired relaxation. Nl RV size/fxn. PASP . Mildly dil LA.   Palpitations    Pre-syncope    Renal disorder    Sleep disturbance     Surgical History: Past Surgical History:  Procedure Laterality Date   ABDOMINAL HYSTERECTOMY     APPENDECTOMY  1973   BREAST  EXCISIONAL BIOPSY Right    CHOLECYSTECTOMY     ERCP N/A 11/22/2017   Procedure: ENDOSCOPIC RETROGRADE CHOLANGIOPANCREATOGRAPHY (ERCP);  Surgeon: Marnee Sink, MD;  Location: [facility] ENDOSCOPY;  Service: Endoscopy;  Laterality: N/A;   ESOPHAGOGASTRODUODENOSCOPY  07-2006   with dilation   KIDNEY SURGERY  1970   lumpectomy  [phone]   benign right breast   SQUAMOUS CELL CARCINOMA EXCISION  6/12   Right leg--Dr Amalia Badder   SQUAMOUS CELL CARCINOMA EXCISION  2012   right calf   TOTAL ABDOMINAL HYSTERECTOMY W/ BILATERAL SALPINGOOPHORECTOMY  1973    Home Medications:  Allergies as of 04/22/2024       Reactions   Macrobid  [nitrofurantoin ] Diarrhea   Penicillins    Has patient had a PCN reaction causing immediate rash, facial/tongue/throat swelling, SOB or lightheadedness with hypotension: Unknown Has patient had a PCN reaction causing severe rash involving mucus membranes or skin necrosis: Unknown Has patient had a PCN reaction that required hospitalization: Unknown Has patient had a PCN reaction occurring within the last 10 years: Unknown If all of the above answers are "NO", then may proceed with Cephalosporin use.   Rabeprazole Sodium    REACTION: constipated   Sulfonamide Derivatives    REACTION: unspecified   Cefuroxime     diarrhea        Medication List        Accurate as of April 21, 2024  3:19  PM. If you have any questions, ask your nurse or doctor.          ascorbic acid 500 MG tablet Commonly known as: VITAMIN C Take 500 mg by mouth daily.   celecoxib  200 MG capsule Commonly known as: CELEBREX  TAKE 1 CAPSULE BY MOUTH EVERY DAY   clobetasol  ointment 0.05 % Commonly known as: TEMOVATE  APPLY TO VULVAR AREA DAILY AT BEDTIME FOR 4 WEEKS, EVERY OTHER DAY FOR 2 WEEKS, TWICE WEEKLY FOR 2 WEEKS, THEN STOP   clorazepate  3.75 MG tablet Commonly known as: TRANXENE  TAKE 1 TABLET(3.75 MG) BY MOUTH TWICE DAILY AS NEEDED FOR ANXIETY   estradiol  0.1 MG/GM vaginal cream Commonly known  as: ESTRACE  Estrogen Cream Instruction Discard applicator Apply pea sized amount to tip of finger to urethra before bed. Wash hands well after application. Use Monday, Wednesday and Friday   metoprolol  tartrate 50 MG tablet Commonly known as: LOPRESSOR  TAKE 1 TABLET(50 MG) BY MOUTH TWICE DAILY   NON FORMULARY Immunity support gummies Takes 1 gummy daily.   NON FORMULARY Black elderberry gummy Takes 1 gummy qd.   omeprazole  20 MG capsule Commonly known as: PRILOSEC TAKE 1 CAPSULE(20 MG) BY MOUTH DAILY        Allergies:  Allergies  Allergen Reactions   Macrobid  [Nitrofurantoin ] Diarrhea   Penicillins     Has patient had a PCN reaction causing immediate rash, facial/tongue/throat swelling, SOB or lightheadedness with hypotension: Unknown Has patient had a PCN reaction causing severe rash involving mucus membranes or skin necrosis: Unknown Has patient had a PCN reaction that required hospitalization: Unknown Has patient had a PCN reaction occurring within the last 10 years: Unknown If all of the above answers are "NO", then may proceed with Cephalosporin use.   Rabeprazole Sodium     REACTION: constipated   Sulfonamide Derivatives     REACTION: unspecified   Cefuroxime      diarrhea    Family History: Family History  Problem Relation Age of Onset   Heart attack Father    Stroke Mother    Melanoma Daughter     Social History:  reports that she has quit smoking. She has never been exposed to tobacco smoke. She has never used smokeless tobacco. She reports that she does not currently use alcohol after a past usage of about 1.0 standard drink of alcohol per week. She reports that she does not use drugs.  ROS: Pertinent ROS in HPI  Physical Exam: There were no vitals taken for this visit.  Constitutional:  Well nourished. Alert and oriented, No acute distress. HEENT: [place] AT, moist mucus membranes.  Trachea midline, no masses. Cardiovascular: No clubbing, cyanosis, or  edema. Respiratory: Normal respiratory effort, no increased work of breathing. GU: No CVA tenderness.  No bladder fullness or masses.  Recession of labia minora, dry, pale vulvar vaginal mucosa and loss of mucosal ridges and folds.  Normal urethral meatus, no lesions, no prolapse, no discharge.   No urethral masses, tenderness and/or tenderness. No bladder fullness, tenderness or masses. *** vagina mucosa, *** estrogen effect, no discharge, no lesions, *** pelvic support, *** cystocele and *** rectocele noted.  No cervical motion tenderness.  Uterus is freely mobile and non-fixed.  No adnexal/parametria masses or tenderness noted.  Anus and perineum are without rashes or lesions.   ***  Neurologic: Grossly intact, no focal deficits, moving all 4 extremities. Psychiatric: Normal mood and affect.    Laboratory Data: Urinalysis See EPIC and HPI  I have reviewed the  labs.  Pertinent Imaging: KUB *** I have independently reviewed the films.  See HPI.    Assessment & Plan:    1. GSM - continue vaginal estrogen cream three nights weekly  2. Left pelvic stone -   3. High risk hematuria - former smoker - recent work up in 2024 NED - ***  No follow-ups on file.  These notes generated with voice recognition software. I apologize for typographical errors.  Briant Camper  [facility] [address]  [address] [place], [place] [address] [phone]

## 2024-04-22 ENCOUNTER — Ambulatory Visit (INDEPENDENT_AMBULATORY_CARE_PROVIDER_SITE_OTHER): Payer: 59 | Admitting: Urology

## 2024-04-22 ENCOUNTER — Encounter: Payer: Self-pay | Admitting: Urology

## 2024-04-22 ENCOUNTER — Ambulatory Visit
Admission: RE | Admit: 2024-04-22 | Discharge: 2024-04-22 | Disposition: A | Discharge status: Home / Self Care | Attending: Urology | Admitting: Urology

## 2024-04-22 ENCOUNTER — Ambulatory Visit
Admission: RE | Admit: 2024-04-22 | Discharge: 2024-04-22 | Disposition: A | Discharge status: Home / Self Care | Source: Ambulatory Visit | Attending: Urology | Admitting: Urology

## 2024-04-22 VITALS — BP 108/71 | HR 70

## 2024-04-22 DIAGNOSIS — R319 Hematuria, unspecified: Secondary | ICD-10-CM

## 2024-04-22 DIAGNOSIS — N200 Calculus of kidney: Secondary | ICD-10-CM | POA: Insufficient documentation

## 2024-04-22 DIAGNOSIS — N952 Postmenopausal atrophic vaginitis: Secondary | ICD-10-CM

## 2024-04-22 LAB — URINALYSIS, COMPLETE
Bilirubin, UA: NEGATIVE
Glucose, UA: NEGATIVE
Ketones, UA: NEGATIVE
Nitrite, UA: NEGATIVE
Specific Gravity, UA: 1.025 (ref 1.005–1.030)
Urobilinogen, Ur: 4 mg/dL — ABNORMAL HIGH (ref 0.2–1.0)
pH, UA: 6 (ref 5.0–7.5)

## 2024-04-22 LAB — MICROSCOPIC EXAMINATION
Epithelial Cells (non renal): >10 /HPF — AB (ref 0–10)
WBC, UA: >30 /HPF — AB (ref 0–5)

## 2024-05-21 ENCOUNTER — Other Ambulatory Visit: Payer: Self-pay | Admitting: Internal Medicine

## 2024-05-21 DIAGNOSIS — G4700 Insomnia, unspecified: Secondary | ICD-10-CM

## 2024-05-22 NOTE — Telephone Encounter (Signed)
 Last filled 02-26-24 #30 Last OV Acute 10-15-23 Next OV 08-03-24 (will need to be r/s) [facility] [address] [employer]

## 2024-05-28 ENCOUNTER — Other Ambulatory Visit: Payer: Self-pay

## 2024-05-28 NOTE — Telephone Encounter (Signed)
 Spoke to pt. She will let us  know where to send it.

## 2024-05-28 NOTE — Telephone Encounter (Signed)
 Copied from CRM [id number]. Topic: Clinical - Medication Question >> May 28, 2024 10:55 AM Franky GRADE wrote: Reason for CRM: Patient is asking for a written script for the clorazepate  (TRANXENE ) 7.5 MG tablet [[id number]] so they can shop around as their pharmacy is charging them an out of pocket for the medication.

## 2024-05-28 NOTE — Telephone Encounter (Addendum)
 I will have to find a provider in the office to sign for it. It may have to wait for Dr Jimmy on Monday.  Would you be willing to print and sign this rx for this pt?

## 2024-05-28 NOTE — Telephone Encounter (Signed)
 Since this is a controlled substance it needs to be sent electronically. They can ask around and then decide where to have it sent

## 2024-05-28 NOTE — Addendum Note (Signed)
 Addended by: KALLIE CLOTILDA SQUIBB on: 05/28/2024 11:18 AM   Modules accepted: Orders

## 2024-05-29 NOTE — Telephone Encounter (Signed)
 Copied from CRM [id number]. Topic: Clinical - Prescription Issue >> May 29, 2024 12:29 PM [place] wrote: Reason for CRM: patient needing to have prescription  clorazepate  (TRANXENE ) 7.5 MG tablet [[id number]] sent to [facility] [address] - [place], [place] - [address] AT [facility] [address] [id number]

## 2024-05-30 MED ORDER — CLORAZEPATE DIPOTASSIUM 3.75 MG PO TABS: 3.7500 mg | ORAL_TABLET | Freq: Two times a day (BID) | ORAL | 0 refills | Status: DC | PRN

## 2024-05-30 NOTE — Addendum Note (Signed)
 Addended by: JIMMY ADE I on: 05/30/2024 10:45 AM   Modules accepted: Orders

## 2024-06-01 ENCOUNTER — Telehealth: Payer: Self-pay

## 2024-06-01 NOTE — Telephone Encounter (Signed)
 SABRA

## 2024-06-01 NOTE — Telephone Encounter (Signed)
 I spoke with Mr Splinter [facility] signed) I asked if calling reference to Clorazepate  3.75mg  because that was already sent to [facility]. Mr Biddy said he was getting another call and he would call [facility] back. Sending note to [place] pool.

## 2024-07-07 ENCOUNTER — Telehealth: Payer: Self-pay | Admitting: Urology

## 2024-07-07 NOTE — Telephone Encounter (Signed)
 This is the pt I sent you a message about earlier.  Her husband is currently in the hospital, so she's been spending the afternoons there.  She is having blood in her urine and wanted to see you.  Can I put her in your 11:00 same day for tomorrow?

## 2024-07-07 NOTE — Progress Notes (Unsigned)
 07/08/2024 9:15 PM   Dominique Hall 03-May-1935 [id number]  Referring provider: Jimmy Charlie FERNS, MD [address] [place],  [place] [address]  Urological history: 1. High risk hematuria -former smoker -CTU (11/2021) - no worrisome findings -cysto (11/2021) - NED -non-contrast CT (12/2022) - nephrolithiasis -cysto (09/2023) - NED  2. Nephrolithiasis -CTU (11/2021) - several stones in left lower pole  -non-contrast CT (12/2022) - several 2 mm calculi in the distal right ureter and right UVJ and 1.2 cm left UPJ    3. Renal cysts -CTU (11/2021) -  bilateral renal cysts, measuring up to 4.5 cm in the interpolar region of the left kidney  4. Cystocele -CTU (11/2021) - small cystocele  5. rUTI's -contributing factors of age, vaginal atrophy, cystocele - December 25, 2023, mixed urogenital flora - December 02, 2023, multiple species present - October 01, 2023, mixed urogenital flora -vaginal estrogen cream    No chief complaint on file.  HPI: Dominique Hall is a 89 y.o. female who presents today for gross hematuria.    Previous records reviewed.   UA ***  PMH: Past Medical History:  Diagnosis Date   Allergy    Arthritis    Chest pain    a. 07/2019 MV: No ischemia/infarct. Low risk; b. 04/2021 MV: No isch/scar. EF 60%. No significant cor Ca2+. Developed atrial tachycardia during study.   Diverticulosis    Esophageal dysmotility    Esophageal ring    GERD (gastroesophageal reflux disease)    Hiatal hernia    HTN (hypertension)    IBS (irritable bowel syndrome)    Kidney stone    Osteopenia    PAH (pulmonary artery hypertension) (HCC)    a. 07/2019 Echo: EF 60-65%, impaired relaxation. Nl RV size/fxn. PASP . Mildly dil LA.   Palpitations    Pre-syncope    Renal disorder    Sleep disturbance     Surgical History: Past Surgical History:  Procedure Laterality Date   ABDOMINAL HYSTERECTOMY     APPENDECTOMY  1973   BREAST EXCISIONAL  BIOPSY Right    CHOLECYSTECTOMY     ERCP N/A 11/22/2017   Procedure: ENDOSCOPIC RETROGRADE CHOLANGIOPANCREATOGRAPHY (ERCP);  Surgeon: Jinny Carmine, MD;  Location: [facility] ENDOSCOPY;  Service: Endoscopy;  Laterality: N/A;   ESOPHAGOGASTRODUODENOSCOPY  07-2006   with dilation   KIDNEY SURGERY  1970   lumpectomy  [phone]   benign right breast   SQUAMOUS CELL CARCINOMA EXCISION  6/12   Right leg--Dr Lorrene   SQUAMOUS CELL CARCINOMA EXCISION  2012   right calf   TOTAL ABDOMINAL HYSTERECTOMY W/ BILATERAL SALPINGOOPHORECTOMY  1973    Home Medications:  Allergies as of 07/08/2024       Reactions   Macrobid  [nitrofurantoin ] Diarrhea   Penicillins    Has patient had a PCN reaction causing immediate rash, facial/tongue/throat swelling, SOB or lightheadedness with hypotension: Unknown Has patient had a PCN reaction causing severe rash involving mucus membranes or skin necrosis: Unknown Has patient had a PCN reaction that required hospitalization: Unknown Has patient had a PCN reaction occurring within the last 10 years: Unknown If all of the above answers are NO, then may proceed with Cephalosporin use.   Rabeprazole Sodium    REACTION: constipated   Sulfonamide Derivatives    REACTION: unspecified   Cefuroxime     diarrhea        Medication List        Accurate as of July 07, 2024  9:15 PM. If  you have any questions, ask your nurse or doctor.          ascorbic acid 500 MG tablet Commonly known as: VITAMIN C Take 500 mg by mouth daily.   celecoxib  200 MG capsule Commonly known as: CELEBREX  TAKE 1 CAPSULE BY MOUTH EVERY DAY   clobetasol  ointment 0.05 % Commonly known as: TEMOVATE  APPLY TO VULVAR AREA DAILY AT BEDTIME FOR 4 WEEKS, EVERY OTHER DAY FOR 2 WEEKS, TWICE WEEKLY FOR 2 WEEKS, THEN STOP   clorazepate  7.5 MG tablet Commonly known as: TRANXENE  TAKE 1 TO 2 TABLETS BY MOUTH AT BEDTIME AS NEEDED   clorazepate  3.75 MG tablet Commonly known as: TRANXENE  Take 1 tablet  (3.75 mg total) by mouth 2 (two) times daily as needed for anxiety. TAKE 1 TABLET(3.75 MG) BY MOUTH TWICE DAILY AS NEEDED FOR ANXIETY   estradiol  0.1 MG/GM vaginal cream Commonly known as: ESTRACE  Estrogen Cream Instruction Discard applicator Apply pea sized amount to tip of finger to urethra before bed. Wash hands well after application. Use Monday, Wednesday and Friday   metoprolol  tartrate 50 MG tablet Commonly known as: LOPRESSOR  TAKE 1 TABLET(50 MG) BY MOUTH TWICE DAILY   NON FORMULARY Immunity support gummies Takes 1 gummy daily.   NON FORMULARY Black elderberry gummy Takes 1 gummy qd.   omeprazole  20 MG capsule Commonly known as: PRILOSEC TAKE 1 CAPSULE(20 MG) BY MOUTH DAILY        Allergies:  Allergies  Allergen Reactions   Macrobid  [Nitrofurantoin ] Diarrhea   Penicillins     Has patient had a PCN reaction causing immediate rash, facial/tongue/throat swelling, SOB or lightheadedness with hypotension: Unknown Has patient had a PCN reaction causing severe rash involving mucus membranes or skin necrosis: Unknown Has patient had a PCN reaction that required hospitalization: Unknown Has patient had a PCN reaction occurring within the last 10 years: Unknown If all of the above answers are NO, then may proceed with Cephalosporin use.   Rabeprazole Sodium     REACTION: constipated   Sulfonamide Derivatives     REACTION: unspecified   Cefuroxime      diarrhea    Family History: Family History  Problem Relation Age of Onset   Heart attack Father    Stroke Mother    Melanoma Daughter     Social History:  reports that she has quit smoking. She has never been exposed to tobacco smoke. She has never used smokeless tobacco. She reports that she does not currently use alcohol after a past usage of about 1.0 standard drink of alcohol per week. She reports that she does not use drugs.  ROS: Pertinent ROS in HPI  Physical Exam: There were no vitals taken for this visit.   Constitutional:  Well nourished. Alert and oriented, No acute distress. HEENT: [place] AT, moist mucus membranes.  Trachea midline, no masses. Cardiovascular: No clubbing, cyanosis, or edema. Respiratory: Normal respiratory effort, no increased work of breathing. GU: No CVA tenderness.  No bladder fullness or masses.  Recession of labia minora, dry, pale vulvar vaginal mucosa and loss of mucosal ridges and folds.  Normal urethral meatus, no lesions, no prolapse, no discharge.   No urethral masses, tenderness and/or tenderness. No bladder fullness, tenderness or masses. *** vagina mucosa, *** estrogen effect, no discharge, no lesions, *** pelvic support, *** cystocele and *** rectocele noted.  No cervical motion tenderness.  Uterus is freely mobile and non-fixed.  No adnexal/parametria masses or tenderness noted.  Anus and perineum are without rashes or  lesions.   ***  Neurologic: Grossly intact, no focal deficits, moving all 4 extremities. Psychiatric: Normal mood and affect.    Laboratory Data: See EPIC and HPI  I have reviewed the labs.  Pertinent Imaging: N/A  Assessment & Plan:    1. GSM - continue vaginal estrogen cream three nights weekly  2. Left kidney stones - stable   3. High risk hematuria - former smoker - recent work up in 2024 NED - reports of gross heme - UA w/ stable micro heme  No follow-ups on file.  These notes generated with voice recognition software. I apologize for typographical errors.  CLOTILDA HELON RIGGERS  [facility] [address]  [address] [place], [place] [address] [phone]

## 2024-07-08 ENCOUNTER — Ambulatory Visit (INDEPENDENT_AMBULATORY_CARE_PROVIDER_SITE_OTHER): Admitting: Urology

## 2024-07-08 ENCOUNTER — Encounter: Payer: Self-pay | Admitting: Urology

## 2024-07-08 VITALS — BP 138/80 | HR 72 | Ht 65.0 in | Wt 135.2 lb

## 2024-07-08 DIAGNOSIS — N390 Urinary tract infection, site not specified: Secondary | ICD-10-CM

## 2024-07-08 DIAGNOSIS — N281 Cyst of kidney, acquired: Secondary | ICD-10-CM | POA: Diagnosis not present

## 2024-07-08 DIAGNOSIS — N200 Calculus of kidney: Secondary | ICD-10-CM

## 2024-07-08 DIAGNOSIS — N8111 Cystocele, midline: Secondary | ICD-10-CM

## 2024-07-08 DIAGNOSIS — R310 Gross hematuria: Secondary | ICD-10-CM | POA: Diagnosis not present

## 2024-07-08 LAB — MICROSCOPIC EXAMINATION
Epithelial Cells (non renal): >10 /HPF — AB (ref 0–10)
RBC, Urine: >30 /HPF — AB (ref 0–2)
WBC, UA: >30 /HPF — AB (ref 0–5)

## 2024-07-08 LAB — URINALYSIS, COMPLETE
Bilirubin, UA: NEGATIVE
Glucose, UA: NEGATIVE
Nitrite, UA: NEGATIVE
Specific Gravity, UA: 1.03 (ref 1.005–1.030)
Urobilinogen, Ur: 1 mg/dL (ref 0.2–1.0)
pH, UA: 6 (ref 5.0–7.5)

## 2024-07-08 MED ORDER — DOXYCYCLINE HYCLATE 100 MG PO CAPS: 100.0000 mg | ORAL_CAPSULE | Freq: Two times a day (BID) | ORAL | 0 refills | Status: DC

## 2024-07-14 ENCOUNTER — Ambulatory Visit: Payer: Self-pay | Admitting: Urology

## 2024-07-14 LAB — CULTURE, URINE COMPREHENSIVE

## 2024-07-23 ENCOUNTER — Other Ambulatory Visit: Payer: Self-pay | Admitting: *Deleted

## 2024-07-23 MED ORDER — CELECOXIB 200 MG PO CAPS: 200.0000 mg | ORAL_CAPSULE | Freq: Every day | ORAL | 0 refills | Status: AC

## 2024-07-23 NOTE — Telephone Encounter (Signed)
 TOC appt scheduled on 09/10/24  Last filled on 04/20/24 #90 caps/ 0 refills

## 2024-08-03 ENCOUNTER — Encounter: Payer: 59 | Admitting: Internal Medicine

## 2024-08-10 ENCOUNTER — Other Ambulatory Visit: Payer: Self-pay

## 2024-08-10 MED ORDER — CLORAZEPATE DIPOTASSIUM 3.75 MG PO TABS: 3.7500 mg | ORAL_TABLET | Freq: Two times a day (BID) | ORAL | 0 refills | Status: DC | PRN

## 2024-08-12 ENCOUNTER — Other Ambulatory Visit: Payer: Self-pay | Admitting: Family Medicine

## 2024-08-12 MED ORDER — CLORAZEPATE DIPOTASSIUM 3.75 MG PO TABS: 3.7500 mg | ORAL_TABLET | Freq: Two times a day (BID) | ORAL | 0 refills | Status: DC | PRN

## 2024-08-12 MED ORDER — OMEPRAZOLE 20 MG PO CPDR: 20.0000 mg | DELAYED_RELEASE_CAPSULE | Freq: Every day | ORAL | 0 refills | Status: DC

## 2024-08-12 NOTE — Telephone Encounter (Signed)
 Copied from CRM #[id number]. Topic: Clinical - Medication Refill >> Aug 12, 2024  2:39 PM Carlyon D wrote: Medication:  omeprazole  (PRILOSEC) 20 MG capsule   Has the patient contacted their pharmacy? No (Agent: If no, request that the patient contact the pharmacy for the refill. If patient does not wish to contact the pharmacy document the reason why and proceed with request.) (Agent: If yes, when and what did the pharmacy advise?)  This is the patient's preferred pharmacy:  [id number] - [place], [place] - [address] AT [facility] [address] [facility] [address] [place] [address] Phone: [phone] Fax: [phone]  Is this the correct pharmacy for this prescription? Yes If no, delete pharmacy and type the correct one.   Has the prescription been filled recently? No  Is the patient out of the medication? No  Has the patient been seen for an appointment in the last year OR does the patient have an upcoming appointment? Yes  Can we respond through MyChart? Yes  Agent: Please be advised that Rx refills may take up to 3 business days. We ask that you follow-up with your pharmacy.

## 2024-08-12 NOTE — Telephone Encounter (Signed)
 Copied from CRM #[id number]. Topic: Clinical - Prescription Issue >> Aug 12, 2024  2:35 PM Paige D wrote: Reason for CRM: Pt is calling in regards to prescription issue medication (Clorazepate  (TRANXENE ) 3.75 MG tablet) Prescription was sent to the wrong pharmacy. Pt would like script resent to correct pharmacy.[email or web address] reach out to pt in regards to status update  Preferred pharmacy: [id number] - [place], [place] - [address] AT [facility] [address] [facility] [address] [place] [address] Phone: [phone] Fax: [phone] Hours: Not open 24 hours

## 2024-08-13 ENCOUNTER — Telehealth: Payer: Self-pay

## 2024-08-13 ENCOUNTER — Other Ambulatory Visit (HOSPITAL_COMMUNITY): Payer: Self-pay

## 2024-08-13 NOTE — Telephone Encounter (Signed)
 Pharmacy Patient Advocate Encounter  Received notification from [facility] that Prior Authorization for Clorazepate  3.75 mg tabs has been DENIED.  No reason given; No denial letter received via Fax or CMM. It has been requested and will be uploaded to the media tab once received.   PA #/Case ID/Reference #: # [id number]

## 2024-08-13 NOTE — Telephone Encounter (Signed)
 Noted

## 2024-08-13 NOTE — Telephone Encounter (Signed)
 Copied from CRM #[id number]. Topic: Clinical - Medication Prior Auth >> Aug 13, 2024 10:05 AM Thersia BROCKS wrote: Reason for CRM: Erminio from [facility] regarding prescription Clorazepate  3.75 mg tabs stated that it has been denied   Hasn't met criteria , patient stated she runs it under the discount program for $16 so she will be doing that   Provider number [id number] option 5

## 2024-08-13 NOTE — Telephone Encounter (Signed)
 Pharmacy Patient Advocate Encounter   Received notification from Onbase that prior authorization for Clorazepate  3.75 mg tabs is required/requested.   Insurance verification completed.   The patient is insured through [facility].   Per test claim: PA required; PA submitted to above mentioned insurance via Latent Key/confirmation #/EOC [id number] Status is pending

## 2024-08-13 NOTE — Telephone Encounter (Signed)
 Patient states she runs it under the discount program for $16 so she will be doing that.

## 2024-09-07 ENCOUNTER — Encounter: Payer: Self-pay | Admitting: Family Medicine

## 2024-09-07 ENCOUNTER — Other Ambulatory Visit: Payer: Self-pay | Admitting: Family Medicine

## 2024-09-07 MED ORDER — CLORAZEPATE DIPOTASSIUM 3.75 MG PO TABS: 3.7500 mg | ORAL_TABLET | Freq: Two times a day (BID) | ORAL | 1 refills | Status: DC | PRN

## 2024-09-07 NOTE — Telephone Encounter (Signed)
 Last office visit 10/15/23 for acute cough with [name].  Last refilled 08/12/2024 for #30 with no refills.  Next Appt: TOC 09/10/2024.

## 2024-09-08 NOTE — Progress Notes (Signed)
 "    Dominique Guadiana T. Jolinda Pinkstaff, MD, CAQ Sports Medicine [facility] [address] [place], [address]  Phone: [phone]  FAX: [phone]  Dominique Hall - 89 y.o. female  MRN [id number]  Date of Birth: April 17, 1935  Date: 09/10/2024  PCP: Watt Mirza, MD  Referral: No ref. provider found  Chief Complaint  Patient presents with   Establish Care   Subjective:   Dominique Hall is a 89 y.o. very pleasant female patient with Body mass index is 22.21 kg/m. who presents with the following:  Discussed the use of AI scribe software for clinical note transcription with the patient, who gave verbal consent to proceed.  This is a patient of Dr. Marval who presents for ongoing longitudinal medical care and transfer of care appointment with me.  I previously have met her a number of times, and recently saw her with her husband.  History of hypertension, relates that she does not take her metoprolol .  Intermittent Tranxene  for anxiety.  Celebrex  as needed arthritis. There has been some concern for cognitive impairment.  Wt Readings from Last 3 Encounters:  09/10/24 125 lb 6 oz (56.9 kg)  07/08/24 135 lb 3.2 oz (61.3 kg)  02/21/24 139 lb 4 oz (63.2 kg)     History of Present Illness Dominique Hall is an 89 year old female who presents with lightheadedness and low blood pressure.  She experiences episodes of lightheadedness, particularly exacerbated by movement such as getting out of bed or a chair. These episodes require her to stop activities if they become severe. She is currently taking metoprolol  50 mg once daily for blood pressure management and questions the necessity of this dosage due to her symptoms.  She has been prescribed Tranxene  3.75 mg for anxiety attacks, which she takes as needed. Taking half a tablet helps if an anxiety episode lasts longer than usual. However, she has not taken it in the past two weeks due to  insurance issues.  She reports not eating lunch regularly and has experienced weight loss to 125 lbs. She last ate in the morning and did not have lunch on the day of the visit.  She has a history of smoking, which she quit in 1978. There are no significant memory issues reported, although her daughter notes some hearing difficulties. Her daughter also mentions that her husband believes she has dementia, but her daughter attributes any perceived memory issues to hearing problems.    Review of Systems is noted in the HPI, as appropriate  Objective:   BP 110/72   Pulse 67   Temp 99.1 F (37.3 C) (Temporal)   Ht 5' 3 (1.6 m)   Wt 125 lb 6 oz (56.9 kg)   SpO2 97%   BMI 22.21 kg/m   GEN: No acute distress; alert,appropriate. CV: RRR, no m/g/r  PULM: Normal respiratory rate, no accessory muscle use. No wheezes, crackles or rhonchi  PSYCH: Normally interactive.   Laboratory and Imaging Data:  Assessment and Plan:     ICD-10-CM   1. Essential hypertension, benign  I10     2. Cognitive changes  R41.89     3. Other fatigue  R53.83 Basic metabolic panel with GFR    CBC with Differential/Platelet    Hepatic function panel    TSH    4. Mixed hyperlipidemia  E78.2 Lipid panel    5. Need for influenza vaccination  Z23 Flu vaccine HIGH DOSE PF(Fluzone Trivalent)  6. Orthostatic hypotension  I95.1      Assessment & Plan Lightheadedness and orthostatic symptoms Intermittent lightheadedness likely due to hypotension from metoprolol , increasing fall risk. - Stop metoprolol . - Monitor blood pressure at home in two weeks.  Essential hypertension Blood pressure at 110/72 mmHg, low for her age. Discussed hypotension risks.  Anxiety, episodic Episodic anxiety managed with Tranxene  3.75 mg. Insurance declined coverage. Discussed alternatives if unaffordable. - Provide GoodRx coupon for Tranxene . - Continue Tranxene  3.75 mg as needed.  Mild cognitive impairment,  age-related Mild cognitive impairment likely age-related. Hearing issues may contribute to perceived decline.  Medication Management during today's office visit: No orders of the defined types were placed in this encounter.  Medications Discontinued During This Encounter  Medication Reason   doxycycline  (VIBRAMYCIN ) 100 MG capsule Completed Course   NON FORMULARY Patient Preference   NON FORMULARY Patient Preference   metoprolol  tartrate (LOPRESSOR ) 50 MG tablet     Orders placed today for conditions managed today: Orders Placed This Encounter  Procedures   Flu vaccine HIGH DOSE PF(Fluzone Trivalent)   Lipid panel   Basic metabolic panel with GFR   CBC with Differential/Platelet   Hepatic function panel   TSH    Disposition: No follow-ups on file.  Dragon [employer] One speech-to-text software was used for transcription in this dictation.  Possible transcriptional errors can occur using Animal nutritionist.   Signed,  Jacques DASEN. Kermit Arnette, MD   Outpatient Encounter Medications as of 09/10/2024  Medication Sig   celecoxib  (CELEBREX ) 200 MG capsule Take 1 capsule (200 mg total) by mouth daily.   clorazepate  (TRANXENE ) 3.75 MG tablet Take 1 tablet (3.75 mg total) by mouth 2 (two) times daily as needed for anxiety.   omeprazole  (PRILOSEC) 20 MG capsule Take 1 capsule (20 mg total) by mouth daily.   vitamin C (ASCORBIC ACID) 500 MG tablet Take 500 mg by mouth daily.   [DISCONTINUED] doxycycline  (VIBRAMYCIN ) 100 MG capsule Take 1 capsule (100 mg total) by mouth every 12 (twelve) hours.   [DISCONTINUED] metoprolol  tartrate (LOPRESSOR ) 50 MG tablet TAKE 1 TABLET(50 MG) BY MOUTH TWICE DAILY (Patient taking differently: Take 50 mg by mouth daily.)   [DISCONTINUED] NON FORMULARY Immunity support gummies Takes 1 gummy daily.   [DISCONTINUED] NON FORMULARY Black elderberry gummy Takes 1 gummy qd.   [DISCONTINUED] clorazepate  (TRANXENE ) 3.75 MG tablet Take 1 tablet (3.75 mg total) by mouth 2  (two) times daily as needed for anxiety. TAKE 1 TABLET(3.75 MG) BY MOUTH TWICE DAILY AS NEEDED FOR ANXIETY   [DISCONTINUED] clorazepate  (TRANXENE ) 7.5 MG tablet TAKE 1 TO 2 TABLETS BY MOUTH AT BEDTIME AS NEEDED   No facility-administered encounter medications on file as of 09/10/2024.   "

## 2024-09-10 ENCOUNTER — Ambulatory Visit (INDEPENDENT_AMBULATORY_CARE_PROVIDER_SITE_OTHER): Admitting: Family Medicine

## 2024-09-10 ENCOUNTER — Encounter: Payer: Self-pay | Admitting: Family Medicine

## 2024-09-10 VITALS — BP 110/72 | HR 67 | Temp 99.1°F | Ht 63.0 in | Wt 125.4 lb

## 2024-09-10 DIAGNOSIS — R4189 Other symptoms and signs involving cognitive functions and awareness: Secondary | ICD-10-CM

## 2024-09-10 DIAGNOSIS — Z23 Encounter for immunization: Secondary | ICD-10-CM | POA: Diagnosis not present

## 2024-09-10 DIAGNOSIS — E782 Mixed hyperlipidemia: Secondary | ICD-10-CM

## 2024-09-10 DIAGNOSIS — R5383 Other fatigue: Secondary | ICD-10-CM | POA: Diagnosis not present

## 2024-09-10 DIAGNOSIS — I951 Orthostatic hypotension: Secondary | ICD-10-CM

## 2024-09-10 DIAGNOSIS — I10 Essential (primary) hypertension: Secondary | ICD-10-CM | POA: Diagnosis not present

## 2024-09-10 NOTE — Patient Instructions (Addendum)
 GoodRx coupon  Stop Metoprolol 

## 2024-09-11 LAB — LIPID PANEL
Cholesterol: 145 mg/dL (ref 0–200)
HDL: 49.8 mg/dL (ref >39.00)
LDL Cholesterol: 75 mg/dL (ref 0–99)
NonHDL: 95.29
Total CHOL/HDL Ratio: 3
Triglycerides: 99 mg/dL (ref 0.0–149.0)
VLDL: 19.8 mg/dL (ref 0.0–40.0)

## 2024-09-11 LAB — CBC WITH DIFFERENTIAL/PLATELET
Basophils Absolute: 0 K/uL (ref 0.0–0.1)
Basophils Relative: 0.2 % (ref 0.0–3.0)
Eosinophils Absolute: 0.2 K/uL (ref 0.0–0.7)
Eosinophils Relative: 3.8 % (ref 0.0–5.0)
HCT: 34.6 % — ABNORMAL LOW (ref 36.0–46.0)
Hemoglobin: 11.9 g/dL — ABNORMAL LOW (ref 12.0–15.0)
Lymphocytes Relative: 16.4 % (ref 12.0–46.0)
Lymphs Abs: 0.9 K/uL (ref 0.7–4.0)
MCHC: 34.5 g/dL (ref 30.0–36.0)
MCV: 86 fl (ref 78.0–100.0)
Monocytes Absolute: 0.4 K/uL (ref 0.1–1.0)
Monocytes Relative: 7.1 % (ref 3.0–12.0)
Neutro Abs: 4 K/uL (ref 1.4–7.7)
Neutrophils Relative %: 72.5 % (ref 43.0–77.0)
Platelets: 157 K/uL (ref 150.0–400.0)
RBC: 4.02 Mil/uL (ref 3.87–5.11)
RDW: 13.3 % (ref 11.5–15.5)
WBC: 5.5 K/uL (ref 4.0–10.5)

## 2024-09-11 LAB — BASIC METABOLIC PANEL WITH GFR
BUN: 36 mg/dL — ABNORMAL HIGH (ref 6–23)
CO2: 27 meq/L (ref 19–32)
Calcium: 9.6 mg/dL (ref 8.4–10.5)
Chloride: 106 meq/L (ref 96–112)
Creatinine, Ser: 0.95 mg/dL (ref 0.40–1.20)
GFR: 53.88 mL/min — ABNORMAL LOW (ref >60.00)
Glucose, Bld: 82 mg/dL (ref 70–99)
Potassium: 4.1 meq/L (ref 3.5–5.1)
Sodium: 142 meq/L (ref 135–145)

## 2024-09-11 LAB — HEPATIC FUNCTION PANEL
ALT: 9 U/L (ref 0–35)
AST: 14 U/L (ref 0–37)
Albumin: 4.2 g/dL (ref 3.5–5.2)
Alkaline Phosphatase: 55 U/L (ref 39–117)
Bilirubin, Direct: 0.3 mg/dL (ref 0.0–0.3)
Total Bilirubin: 1.1 mg/dL (ref 0.2–1.2)
Total Protein: 6 g/dL (ref 6.0–8.3)

## 2024-09-11 LAB — TSH: TSH: 0.5 u[IU]/mL (ref 0.35–5.50)

## 2024-09-14 ENCOUNTER — Ambulatory Visit: Payer: Self-pay | Admitting: Family Medicine

## 2024-09-15 ENCOUNTER — Ambulatory Visit: Payer: Medicare Other | Admitting: Urology

## 2024-09-28 ENCOUNTER — Ambulatory Visit: Payer: Self-pay

## 2024-09-28 NOTE — Telephone Encounter (Signed)
 FYI Only or Action Required?: Action required by provider: update on patient condition.  Patient was last seen in primary care on 09/10/2024 by Watt Mirza, MD.  Called Nurse Triage reporting Hypotension.  Symptoms began several days ago.  Interventions attempted: Rest, hydration, or home remedies.  Symptoms are: unchanged.  Triage Disposition: Go to ED Now (or PCP Triage)  Patient/caregiver understands and will follow disposition?: Unsure          Copied from CRM #[id number]. Topic: Clinical - Red Word Triage >> Sep 28, 2024  9:49 AM Dominique Hall wrote: Red Word that prompted transfer to Nurse Triage: low BP concerns 105/56 Reason for Disposition  [1] Fall in systolic BP > 20 mm Hg from normal AND [2] feeling weak or lightheaded    Caregiver endorses limited UOP, but did have UOP during triage call. Pt daughter calling on pt behalf with texting communication with home caregiver.  Answer Assessment - Initial Assessment Questions 1. BLOOD PRESSURE: What is your blood pressure? Did you take at least two measurements 5 minutes apart?     105/56  BPM 56 this AM 1005: 116/51 BPM 57 Baseline 120s/70s 2. ONSET: When did you take your blood pressure?     Over the weekend 3. HOW: How did you take your blood pressure? (e.g., visiting nurse, automatic home BP monitor)     Home caregiver with auto BP 4. HISTORY: Do you have a history of low blood pressure? What is your blood pressure normally?     Yes, PCP has recently d/c BP med 5. MEDICINES: Are you taking any medicines for blood pressure? If Yes, ask: Have they been changed recently?     Recently d/c metoprolol  6. PULSE RATE: Do you know what your pulse rate is?      See above 7. OTHER SYMPTOMS: Have you been sick recently? Have you had a recent injury?     Felt off 8. PREGNANCY: Is there any chance you are pregnant? When was your last menstrual period?     N/a  Protocols used: Blood Pressure -  Low-A-AH

## 2024-09-28 NOTE — Telephone Encounter (Signed)
 Mrs. Nakata notified as instructed by telephone.  She is off all her BP medications.  Follow up appointment scheduled with [name] 09/30/24 at 11:00 am.

## 2024-09-28 NOTE — Telephone Encounter (Signed)
 Can you call back?  She is off of all BP medication now.  (Double-check that she stopped it.) The first thing I would try would be to drink Gatorade or similar, and add some table salt to her food or even some to Gatorade.  We can have her follow-up in the office later in the week.

## 2024-09-29 NOTE — Progress Notes (Unsigned)
     Donyell Ding T. Mcgwire Dasaro, MD, CAQ Sports Medicine [facility] at [facility] [address] [place], [address]  Phone: [phone]  FAX: [phone]  Dominique Hall - 89 y.o. female  MRN [id number]  Date of Birth: 1935-07-19  Date: 09/30/2024  PCP: Watt Mirza, MD  Referral: Watt Mirza, MD  No chief complaint on file.  Subjective:   Dominique Hall is a 89 y.o. very pleasant female patient with There is no height or weight on file to calculate BMI. who presents with the following:  Discussed the use of AI scribe software for clinical note transcription with the patient, who gave verbal consent to proceed.  89 year old presents with concerns about low blood pressure.  I received a call about this on Monday.  The patient previously stopped her metoprolol .  I recommended that she drink some Gatorade and add salt to her food. History of Present Illness     Review of Systems is noted in the HPI, as appropriate  Objective:   There were no vitals taken for this visit.  GEN: No acute distress; alert,appropriate. PULM: Breathing comfortably in no respiratory distress PSYCH: Normally interactive.   Laboratory and Imaging Data:  Assessment and Plan:   No diagnosis found. Assessment & Plan   Medication Management during today's office visit: No orders of the defined types were placed in this encounter.  There are no discontinued medications.  Orders placed today for conditions managed today: No orders of the defined types were placed in this encounter.   Disposition: No follow-ups on file.  Dragon [employer] One speech-to-text software was used for transcription in this dictation.  Possible transcriptional errors can occur using Animal nutritionist.   Signed,  Mirza DASEN. Sharonda Llamas, MD   Outpatient Encounter Medications as of 09/30/2024  Medication Sig   celecoxib  (CELEBREX ) 200 MG capsule Take 1 capsule (200 mg total) by mouth  daily.   clorazepate  (TRANXENE ) 3.75 MG tablet Take 1 tablet (3.75 mg total) by mouth 2 (two) times daily as needed for anxiety.   omeprazole  (PRILOSEC) 20 MG capsule Take 1 capsule (20 mg total) by mouth daily.   vitamin C (ASCORBIC ACID) 500 MG tablet Take 500 mg by mouth daily.   No facility-administered encounter medications on file as of 09/30/2024.

## 2024-09-30 ENCOUNTER — Ambulatory Visit (INDEPENDENT_AMBULATORY_CARE_PROVIDER_SITE_OTHER): Admitting: Family Medicine

## 2024-09-30 ENCOUNTER — Encounter: Payer: Self-pay | Admitting: Family Medicine

## 2024-09-30 VITALS — BP 138/72 | HR 91 | Temp 98.5°F | Ht 63.0 in | Wt 124.0 lb

## 2024-09-30 DIAGNOSIS — R42 Dizziness and giddiness: Secondary | ICD-10-CM | POA: Diagnosis not present

## 2024-10-05 ENCOUNTER — Ambulatory Visit: Admitting: Family Medicine

## 2024-10-18 NOTE — Progress Notes (Deleted)
 10/21/2024 4:16 PM   Dominique Hall 1935/02/15 [id number]  Referring provider: Jimmy Charlie FERNS, MD No address on file  Urological history: 1. High risk hematuria -former smoker -CTU (11/2021) - no worrisome findings -cysto (11/2021) - NED -non-contrast CT (12/2022) - nephrolithiasis -cysto (09/2023) - NED  2. Nephrolithiasis -CTU (11/2021) - several stones in left lower pole  -non-contrast CT (12/2022) - several 2 mm calculi in the distal right ureter and right UVJ and 1.2 cm left UPJ    3. Renal cysts -CTU (11/2021) -  bilateral renal cysts, measuring up to 4.5 cm in the interpolar region of the left kidney  4. Cystocele -CTU (11/2021) - small cystocele  5. rUTI's -contributing factors of age, vaginal atrophy, cystocele - July 08, 2024 - mixed urogential flora - December 25, 2023, mixed urogenital flora - December 02, 2023, multiple species present - vaginal estrogen cream    No chief complaint on file.  HPI: Dominique Hall is a 89 y.o. female who presents today for follow up.     Previous records reviewed.   UA ***  PMH: Past Medical History:  Diagnosis Date   Chest pain    a. 07/2019 MV: No ischemia/infarct. Low risk; b. 04/2021 MV: No isch/scar. EF 60%. No significant cor Ca2+. Developed atrial tachycardia during study.   Diverticulosis    Esophageal dysmotility    Esophageal ring    GERD (gastroesophageal reflux disease)    Hiatal hernia    HTN (hypertension)    IBS (irritable bowel syndrome)    Kidney stone    Osteopenia    PAH (pulmonary artery hypertension) (HCC)    a. 07/2019 Echo: EF 60-65%, impaired relaxation. Nl RV size/fxn. PASP . Mildly dil LA.   Sleep disturbance     Surgical History: Past Surgical History:  Procedure Laterality Date   ABDOMINAL HYSTERECTOMY     APPENDECTOMY  1973   BREAST EXCISIONAL BIOPSY Right    CHOLECYSTECTOMY     ERCP N/A 11/22/2017   Procedure: ENDOSCOPIC RETROGRADE  CHOLANGIOPANCREATOGRAPHY (ERCP);  Surgeon: Jinny Carmine, MD;  Location: [facility] ENDOSCOPY;  Service: Endoscopy;  Laterality: N/A;   ESOPHAGOGASTRODUODENOSCOPY  07-2006   with dilation   KIDNEY SURGERY  1970   lumpectomy  [phone]   benign right breast   SQUAMOUS CELL CARCINOMA EXCISION  6/12   Right leg--Dr Lorrene   SQUAMOUS CELL CARCINOMA EXCISION  2012   right calf   TOTAL ABDOMINAL HYSTERECTOMY W/ BILATERAL SALPINGOOPHORECTOMY  1973    Home Medications:  Allergies as of 10/21/2024       Reactions   Macrobid  [nitrofurantoin ] Diarrhea   Penicillins    Has patient had a PCN reaction causing immediate rash, facial/tongue/throat swelling, SOB or lightheadedness with hypotension: Unknown Has patient had a PCN reaction causing severe rash involving mucus membranes or skin necrosis: Unknown Has patient had a PCN reaction that required hospitalization: Unknown Has patient had a PCN reaction occurring within the last 10 years: Unknown If all of the above answers are NO, then may proceed with Cephalosporin use.   Rabeprazole Sodium    REACTION: constipated   Sulfonamide Derivatives    REACTION: unspecified   Cefuroxime     diarrhea        Medication List        Accurate as of October 18, 2024  4:16 PM. If you have any questions, ask your nurse or doctor.          ascorbic acid 500 MG tablet  Commonly known as: VITAMIN C Take 500 mg by mouth daily.   celecoxib  200 MG capsule Commonly known as: CELEBREX  Take 1 capsule (200 mg total) by mouth daily.   clorazepate  3.75 MG tablet Commonly known as: TRANXENE  Take 1 tablet (3.75 mg total) by mouth 2 (two) times daily as needed for anxiety.   omeprazole  20 MG capsule Commonly known as: PRILOSEC Take 1 capsule (20 mg total) by mouth daily.        Allergies:  Allergies  Allergen Reactions   Macrobid  [Nitrofurantoin ] Diarrhea   Penicillins     Has patient had a PCN reaction causing immediate rash, facial/tongue/throat  swelling, SOB or lightheadedness with hypotension: Unknown Has patient had a PCN reaction causing severe rash involving mucus membranes or skin necrosis: Unknown Has patient had a PCN reaction that required hospitalization: Unknown Has patient had a PCN reaction occurring within the last 10 years: Unknown If all of the above answers are NO, then may proceed with Cephalosporin use.   Rabeprazole Sodium     REACTION: constipated   Sulfonamide Derivatives     REACTION: unspecified   Cefuroxime      diarrhea    Family History: Family History  Problem Relation Age of Onset   Heart attack Father    Stroke Mother    Melanoma Daughter     Social History:  reports that she has quit smoking. She has never been exposed to tobacco smoke. She has never used smokeless tobacco. She reports that she does not currently use alcohol after a past usage of about 1.0 standard drink of alcohol per week. She reports that she does not use drugs.  ROS: Pertinent ROS in HPI  Physical Exam: There were no vitals taken for this visit.  Constitutional:  Well nourished. Alert and oriented, No acute distress. HEENT: [place] AT, moist mucus membranes.  Trachea midline, no masses. Cardiovascular: No clubbing, cyanosis, or edema. Respiratory: Normal respiratory effort, no increased work of breathing. GU: No CVA tenderness.  No bladder fullness or masses.  Recession of labia minora, dry, pale vulvar vaginal mucosa and loss of mucosal ridges and folds.  Normal urethral meatus, no lesions, no prolapse, no discharge.   No urethral masses, tenderness and/or tenderness. No bladder fullness, tenderness or masses. *** vagina mucosa, *** estrogen effect, no discharge, no lesions, *** pelvic support, *** cystocele and *** rectocele noted.  No cervical motion tenderness.  Uterus is freely mobile and non-fixed.  No adnexal/parametria masses or tenderness noted.  Anus and perineum are without rashes or lesions.   ***  Neurologic:  Grossly intact, no focal deficits, moving all 4 extremities. Psychiatric: Normal mood and affect.  .    Laboratory Data: See EPIC and HPI  I have reviewed the labs.  Pertinent Imaging: N/A  Assessment & Plan:    1. GSM - continue vaginal estrogen cream three nights weekly  2. Left kidney stones - stable   3. High risk hematuria - former smoker - recent work up in 2024 NED - no reports of gross heme *** - ***   No follow-ups on file.  These notes generated with voice recognition software. I apologize for typographical errors.  Dominique Hall  [facility] [address]  [address] [place], [place] [address] [phone]

## 2024-10-19 ENCOUNTER — Other Ambulatory Visit: Payer: Self-pay | Admitting: Family Medicine

## 2024-10-21 ENCOUNTER — Ambulatory Visit: Admitting: Urology

## 2024-10-21 DIAGNOSIS — N200 Calculus of kidney: Secondary | ICD-10-CM

## 2024-10-21 DIAGNOSIS — N958 Other specified menopausal and perimenopausal disorders: Secondary | ICD-10-CM

## 2024-10-21 DIAGNOSIS — R319 Hematuria, unspecified: Secondary | ICD-10-CM

## 2024-10-22 ENCOUNTER — Other Ambulatory Visit: Payer: Self-pay | Admitting: Cardiovascular Disease

## 2024-10-22 NOTE — Progress Notes (Deleted)
 10/28/2024 2:48 PM   Dominique Hall 07-05-1935 [id number]  Referring provider: Jimmy Charlie FERNS, MD No address on file  Urological history: 1. High risk hematuria -former smoker -CTU (11/2021) - no worrisome findings -cysto (11/2021) - NED -non-contrast CT (12/2022) - nephrolithiasis -cysto (09/2023) - NED  2. Nephrolithiasis -CTU (11/2021) - several stones in left lower pole  -non-contrast CT (12/2022) - several 2 mm calculi in the distal right ureter and right UVJ and 1.2 cm left UPJ    3. Renal cysts -CTU (11/2021) -  bilateral renal cysts, measuring up to 4.5 cm in the interpolar region of the left kidney  4. Cystocele -CTU (11/2021) - small cystocele  5. rUTI's -contributing factors of age, vaginal atrophy, cystocele - July 08, 2024 - mixed urogential flora - December 25, 2023, mixed urogenital flora - December 02, 2023, multiple species present - vaginal estrogen cream    No chief complaint on file.  HPI: Dominique Hall is a 89 y.o. female who presents today for follow up.     Previous records reviewed.   UA ***  PMH: Past Medical History:  Diagnosis Date   Chest pain    a. 07/2019 MV: No ischemia/infarct. Low risk; b. 04/2021 MV: No isch/scar. EF 60%. No significant cor Ca2+. Developed atrial tachycardia during study.   Diverticulosis    Esophageal dysmotility    Esophageal ring    GERD (gastroesophageal reflux disease)    Hiatal hernia    HTN (hypertension)    IBS (irritable bowel syndrome)    Kidney stone    Osteopenia    PAH (pulmonary artery hypertension) (HCC)    a. 07/2019 Echo: EF 60-65%, impaired relaxation. Nl RV size/fxn. PASP . Mildly dil LA.   Sleep disturbance     Surgical History: Past Surgical History:  Procedure Laterality Date   ABDOMINAL HYSTERECTOMY     APPENDECTOMY  1973   BREAST EXCISIONAL BIOPSY Right    CHOLECYSTECTOMY     ERCP N/A 11/22/2017   Procedure: ENDOSCOPIC RETROGRADE  CHOLANGIOPANCREATOGRAPHY (ERCP);  Surgeon: Jinny Carmine, MD;  Location: [facility] ENDOSCOPY;  Service: Endoscopy;  Laterality: N/A;   ESOPHAGOGASTRODUODENOSCOPY  07-2006   with dilation   KIDNEY SURGERY  1970   lumpectomy  [phone]   benign right breast   SQUAMOUS CELL CARCINOMA EXCISION  6/12   Right leg--Dr Lorrene   SQUAMOUS CELL CARCINOMA EXCISION  2012   right calf   TOTAL ABDOMINAL HYSTERECTOMY W/ BILATERAL SALPINGOOPHORECTOMY  1973    Home Medications:  Allergies as of 10/28/2024       Reactions   Macrobid  [nitrofurantoin ] Diarrhea   Penicillins    Has patient had a PCN reaction causing immediate rash, facial/tongue/throat swelling, SOB or lightheadedness with hypotension: Unknown Has patient had a PCN reaction causing severe rash involving mucus membranes or skin necrosis: Unknown Has patient had a PCN reaction that required hospitalization: Unknown Has patient had a PCN reaction occurring within the last 10 years: Unknown If all of the above answers are NO, then may proceed with Cephalosporin use.   Rabeprazole Sodium    REACTION: constipated   Sulfonamide Derivatives    REACTION: unspecified   Cefuroxime     diarrhea        Medication List        Accurate as of October 22, 2024  2:48 PM. If you have any questions, ask your nurse or doctor.          ascorbic acid 500 MG tablet  Commonly known as: VITAMIN C Take 500 mg by mouth daily.   celecoxib  200 MG capsule Commonly known as: CELEBREX  Take 1 capsule (200 mg total) by mouth daily.   clorazepate  3.75 MG tablet Commonly known as: TRANXENE  Take 1 tablet (3.75 mg total) by mouth 2 (two) times daily as needed for anxiety.   omeprazole  20 MG capsule Commonly known as: PRILOSEC TAKE 1 CAPSULE(20 MG) BY MOUTH DAILY        Allergies:  Allergies  Allergen Reactions   Macrobid  [Nitrofurantoin ] Diarrhea   Penicillins     Has patient had a PCN reaction causing immediate rash, facial/tongue/throat swelling, SOB  or lightheadedness with hypotension: Unknown Has patient had a PCN reaction causing severe rash involving mucus membranes or skin necrosis: Unknown Has patient had a PCN reaction that required hospitalization: Unknown Has patient had a PCN reaction occurring within the last 10 years: Unknown If all of the above answers are NO, then may proceed with Cephalosporin use.   Rabeprazole Sodium     REACTION: constipated   Sulfonamide Derivatives     REACTION: unspecified   Cefuroxime      diarrhea    Family History: Family History  Problem Relation Age of Onset   Heart attack Father    Stroke Mother    Melanoma Daughter     Social History:  reports that she has quit smoking. She has never been exposed to tobacco smoke. She has never used smokeless tobacco. She reports that she does not currently use alcohol after a past usage of about 1.0 standard drink of alcohol per week. She reports that she does not use drugs.  ROS: Pertinent ROS in HPI  Physical Exam: There were no vitals taken for this visit.  Constitutional:  Well nourished. Alert and oriented, No acute distress. HEENT: [place] AT, moist mucus membranes.  Trachea midline, no masses. Cardiovascular: No clubbing, cyanosis, or edema. Respiratory: Normal respiratory effort, no increased work of breathing. GU: No CVA tenderness.  No bladder fullness or masses.  Recession of labia minora, dry, pale vulvar vaginal mucosa and loss of mucosal ridges and folds.  Normal urethral meatus, no lesions, no prolapse, no discharge.   No urethral masses, tenderness and/or tenderness. No bladder fullness, tenderness or masses. *** vagina mucosa, *** estrogen effect, no discharge, no lesions, *** pelvic support, *** cystocele and *** rectocele noted.  No cervical motion tenderness.  Uterus is freely mobile and non-fixed.  No adnexal/parametria masses or tenderness noted.  Anus and perineum are without rashes or lesions.   ***  Neurologic: Grossly intact, no  focal deficits, moving all 4 extremities. Psychiatric: Normal mood and affect.  .    Laboratory Data: See EPIC and HPI  I have reviewed the labs.  Pertinent Imaging: N/A  Assessment & Plan:    1. GSM - continue vaginal estrogen cream three nights weekly  2. Left kidney stones - stable   3. High risk hematuria - former smoker - recent work up in 2024 NED - no reports of gross heme *** - ***   No follow-ups on file.  These notes generated with voice recognition software. I apologize for typographical errors.  Dominique Hall  [facility] [address]  [address] [place], [place] [address] [phone]

## 2024-10-28 ENCOUNTER — Ambulatory Visit: Admitting: Urology

## 2024-11-03 ENCOUNTER — Other Ambulatory Visit (HOSPITAL_COMMUNITY): Payer: Self-pay

## 2024-11-03 NOTE — Progress Notes (Unsigned)
 "    Dominique Krasinski T. Deshana Rominger, MD, CAQ Sports Medicine [facility] at [facility] [address] [place], [address]  Phone: [phone]  FAX: [phone]  Dominique Hall - 89 y.o. female  MRN [id number]  Date of Birth: 1935-08-20  Date: 11/04/2024  PCP: Watt Mirza, MD  Referral: Watt Mirza, MD  Chief Complaint  Patient presents with   Fatigue   Anxiety   Hypotension   Shortness of Breath        Subjective:   Dominique Hall is a 89 y.o. very pleasant female patient with Body mass index is 21.48 kg/m. who presents with the following:  Discussed the use of AI scribe software for clinical note transcription with the patient, who gave verbal consent to proceed.  This is a well-known elderly patient who presents with low blood pressure.  She is a former Dr. Jimmy patient.  She also sees Dr. Gollan.  She is currently not on any blood pressure medication.  I saw her 6 weeks ago with hypotension, and at that time I had her add salt to her food, drink electrolyte drinks and stand for 30 seconds before walking with rising from a seated position. - She is feeling faint and lightheaded often which she describes as a spell.  Wt Readings from Last 3 Encounters:  11/04/24 121 lb 4 oz (55 kg)  09/30/24 124 lb (56.2 kg)  09/10/24 125 lb 6 oz (56.9 kg)    BP Readings from Last 3 Encounters:  11/04/24 96/62  09/30/24 138/72  09/10/24 110/72    History of Present Illness Dominique Hall is an 89 year old female who presents with episodes of feeling faint and lightheadedness.  She experiences episodes of feeling faint, which occurred as recently as this morning. During these episodes, she feels unable to stand and must sit until the sensation passes. She describes these episodes as not frightening anymore but concerning enough to prevent her from getting up until they resolve.  Occasional shortness of breath. She has a history of smoking,  having quit in 1978.  She is not currently taking metoprolol , despite a recent prescription refill. She was previously prescribed metoprolol  for supraventricular tachycardia and high blood pressure, but she has not restarted it with concerns about hypotension and presyncope. She denies chest pain  Her current medications include Prilosec (omeprazole ) for stomach acid, Tranxene  (clorazepate ) once daily, and Celebrex  (celecoxib ) daily. She has not taken metoprolol  since the refill was sent on December 11th.  She mentions a recent weight loss, noting she has lost approximately 20 pounds, now weighing 140.8 pounds.    Review of Systems is noted in the HPI, as appropriate  Objective:   BP 96/62   Pulse 95   Temp 98.2 F (36.8 C) (Temporal)   Wt 121 lb 4 oz (55 kg)   SpO2 96%   BMI 21.48 kg/m   GEN: No acute distress; alert,appropriate. CV: RRR, no m/g/r  PULM: Normal respiratory rate, no accessory muscle use. No wheezes, crackles or rhonchi  PSYCH: Normally interactive.   Laboratory and Imaging Data:  Assessment and Plan:     ICD-10-CM   1. Orthostatic hypotension  I95.1     2. Dizziness  R42     3. Shortness of breath  R06.02 DG Chest 2 View    4. History of cigarette smoking  Z87.891 DG Chest 2 View     Assessment & Plan Orthostatic hypotension Experiencing faintness upon standing, consistent with orthostatic  hypotension. Metoprolol  not recommended due to risk of further lowering blood pressure.   Shortness of breath Mild shortness of breath, possibly related to orthostatic hypotension or other conditions.  Cardiology felt like this was most likely due to deconditioning. - Ordered chest x-ray, and I do not see any mass or pneumonia. radiology interpretation is pending.  Essential hypertension now hypotensive Blood pressure low at 90/60 mmHg.  - Avoid medications that may lower blood pressure further. I am going to have the patient start midodrine  with follow-up in 4  weeks  Medication Management during today's office visit: Meds ordered this encounter  Medications   midodrine  (PROAMATINE ) 2.5 MG tablet    Sig: Take 1 tablet (2.5 mg total) by mouth 3 (three) times daily with meals.    Dispense:  90 tablet    Refill:  2   There are no discontinued medications.  Orders placed today for conditions managed today: Orders Placed This Encounter  Procedures   DG Chest 2 View    Disposition: Return in about 1 month (around 12/05/2024) for low blood pressure.  [employer] One speech-to-text software was used for transcription in this dictation.  Possible transcriptional errors can occur using Animal nutritionist.   Signed,  Jacques DASEN. Rehema Muffley, MD   Outpatient Encounter Medications as of 11/04/2024  Medication Sig   celecoxib  (CELEBREX ) 200 MG capsule Take 1 capsule (200 mg total) by mouth daily.   clorazepate  (TRANXENE ) 3.75 MG tablet Take 1 tablet (3.75 mg total) by mouth 2 (two) times daily as needed for anxiety.   midodrine  (PROAMATINE ) 2.5 MG tablet Take 1 tablet (2.5 mg total) by mouth 3 (three) times daily with meals.   omeprazole  (PRILOSEC) 20 MG capsule TAKE 1 CAPSULE(20 MG) BY MOUTH DAILY   vitamin C (ASCORBIC ACID) 500 MG tablet Take 500 mg by mouth daily.   No facility-administered encounter medications on file as of 11/04/2024.   "

## 2024-11-04 ENCOUNTER — Ambulatory Visit (INDEPENDENT_AMBULATORY_CARE_PROVIDER_SITE_OTHER)
Admission: RE | Admit: 2024-11-04 | Discharge: 2024-11-04 | Disposition: A | Discharge status: Home / Self Care | Source: Ambulatory Visit | Attending: Family Medicine | Admitting: Family Medicine

## 2024-11-04 ENCOUNTER — Encounter: Payer: Self-pay | Admitting: Family Medicine

## 2024-11-04 ENCOUNTER — Ambulatory Visit: Admitting: Family Medicine

## 2024-11-04 VITALS — BP 96/62 | HR 95 | Temp 98.2°F | Wt 121.2 lb

## 2024-11-04 DIAGNOSIS — Z87891 Personal history of nicotine dependence: Secondary | ICD-10-CM

## 2024-11-04 DIAGNOSIS — R0602 Shortness of breath: Secondary | ICD-10-CM

## 2024-11-04 DIAGNOSIS — I951 Orthostatic hypotension: Secondary | ICD-10-CM

## 2024-11-04 DIAGNOSIS — R42 Dizziness and giddiness: Secondary | ICD-10-CM | POA: Diagnosis not present

## 2024-11-04 MED ORDER — MIDODRINE HCL 2.5 MG PO TABS: 2.5000 mg | ORAL_TABLET | Freq: Three times a day (TID) | ORAL | 2 refills | Status: DC

## 2024-11-10 ENCOUNTER — Telehealth: Payer: Self-pay | Admitting: *Deleted

## 2024-11-10 NOTE — Telephone Encounter (Signed)
 Mandy returned call, I relayed message and she agrees to medication.

## 2024-11-10 NOTE — Telephone Encounter (Signed)
 Copied from CRM [id number]. Topic: Clinical - Medication Question >> Nov 10, 2024 11:59 AM Dominique Hall wrote: Reason for CRM: Patient daugther [name] called in regarding midodrine  (PROAMATINE ) 2.5 MG tablet, stated morning or night or once a day  , stated she is not sure that her mom will remember to take it 3 times a day would like to know if there is any other medication that she could take or higher dosage so she will not have to take it so many times   [phone]

## 2024-11-10 NOTE — Telephone Encounter (Signed)
 Left message for [name] to return call to office.  When she calls back please let her know to have her mom take the Midodrine  one tablet twice a day.   Medication list updated.

## 2024-11-10 NOTE — Telephone Encounter (Signed)
 Dominique Hall notified by telephone to just take the Midodrine1 tablet two times a day per [name].   Patient states that is how she has been taking it since it was a new medication but so far it seems good.  FYI to [name]

## 2024-11-10 NOTE — Telephone Encounter (Signed)
 They can change it to twice a day dosing.  She should not increase the dose.

## 2024-11-17 ENCOUNTER — Ambulatory Visit: Admitting: Urology

## 2024-11-20 ENCOUNTER — Ambulatory Visit: Payer: Self-pay | Admitting: Family Medicine

## 2024-11-20 DIAGNOSIS — R911 Solitary pulmonary nodule: Secondary | ICD-10-CM

## 2024-11-20 DIAGNOSIS — R9389 Abnormal findings on diagnostic imaging of other specified body structures: Secondary | ICD-10-CM

## 2024-11-27 NOTE — Telephone Encounter (Signed)
 If took sometime to communicate with the patient.  Messages to the patient and her daughter were not returned, so we were able to connect with her via telephone today and we will do follow-up chest CT to evaluate the 1.4 cm nodule in her lung.  Evaluate for neoplasm.

## 2024-11-27 NOTE — Telephone Encounter (Signed)
 Can you please call the patient directly.  I sent her a MyChart message, and this has not been responded to.  I also sent her daughter a MyChart message via Mr. Warbington chart, which she routinely checks.  I have still not heard any kind of response from anyone in the family.  Recommend CT scan of the chest given x-ray findings with 1.4 cm nodule in the lungs.  Basically, I am suggesting this because we need to know if there is anything significantly bad going on in the lungs.

## 2024-12-01 ENCOUNTER — Other Ambulatory Visit: Payer: Self-pay | Admitting: Family Medicine

## 2024-12-01 NOTE — Telephone Encounter (Signed)
 Last office visit 11/04/2024 for fatigue, anxiety, sob and hypotension.  Last refilled 09/07/2024 for #30 with 1 refills.  Next Appt: No future appointments.

## 2024-12-02 MED ORDER — CLORAZEPATE DIPOTASSIUM 3.75 MG PO TABS: 3.7500 mg | ORAL_TABLET | Freq: Two times a day (BID) | ORAL | 3 refills | Status: AC | PRN

## 2024-12-03 ENCOUNTER — Telehealth: Payer: Self-pay

## 2024-12-03 ENCOUNTER — Other Ambulatory Visit (HOSPITAL_COMMUNITY): Payer: Self-pay

## 2024-12-03 NOTE — Telephone Encounter (Signed)
 Pharmacy Patient Advocate Encounter   Received notification from [facility] that prior authorization for Clorazepate  3.75 tabs is required/requested.   Insurance verification completed.   The patient is insured through [facility].   Per test claim: PA required; PA submitted to above mentioned insurance via Latent Key/confirmation #/EOC [id number] Status is pending

## 2024-12-07 ENCOUNTER — Other Ambulatory Visit (HOSPITAL_COMMUNITY): Payer: Self-pay

## 2024-12-11 ENCOUNTER — Other Ambulatory Visit (HOSPITAL_COMMUNITY): Payer: Self-pay

## 2024-12-11 NOTE — Telephone Encounter (Signed)
 Pharmacy Patient Advocate Encounter  Received notification from [facility] that Prior Authorization for Clorazepate  dipotassium 3.75 tabs has been APPROVED from 12/03/24 to 12/03/25. Ran test claim, Copay is $37.18. This test claim was processed through [facility]- copay amounts may vary at other pharmacies due to pharmacy/plan contracts, or as the patient moves through the different stages of their insurance plan.   PA #/Case ID/Reference #: # [id number]

## 2024-12-14 ENCOUNTER — Telehealth: Payer: Self-pay | Admitting: Family Medicine

## 2024-12-15 NOTE — Telephone Encounter (Signed)
 Spoke with [name].  She thinks she is calling about her husbands labs.  I advised [name] and discussed all his lab with Dominique Hall and their daughter in detail last week.  Dominique Hall got on the phone and we discussed all his upcoming appointment with the cardiologist and pulmonologist.  He states that is all he needed to know.

## 2024-12-17 ENCOUNTER — Ambulatory Visit: Admitting: Urology
